# Patient Record
Sex: Female | Born: 1945
Health system: Southern US, Community
[De-identification: ages and names within clinical notes are randomized; demographics above are authoritative.]

## PROBLEM LIST (undated history)

## (undated) DIAGNOSIS — IMO0001 Reserved for inherently not codable concepts without codable children: Secondary | ICD-10-CM

## (undated) DIAGNOSIS — J449 Chronic obstructive pulmonary disease, unspecified: Secondary | ICD-10-CM

## (undated) DIAGNOSIS — G4733 Obstructive sleep apnea (adult) (pediatric): Secondary | ICD-10-CM

## (undated) DIAGNOSIS — F32A Depression, unspecified: Secondary | ICD-10-CM

## (undated) DIAGNOSIS — F1010 Alcohol abuse, uncomplicated: Secondary | ICD-10-CM

## (undated) DIAGNOSIS — K219 Gastro-esophageal reflux disease without esophagitis: Secondary | ICD-10-CM

## (undated) DIAGNOSIS — I10 Essential (primary) hypertension: Secondary | ICD-10-CM

## (undated) DIAGNOSIS — F329 Major depressive disorder, single episode, unspecified: Secondary | ICD-10-CM

## (undated) HISTORY — DX: Gastro-esophageal reflux disease without esophagitis: K21.9

## (undated) HISTORY — DX: Major depressive disorder, single episode, unspecified: F32.9

## (undated) HISTORY — DX: Alcohol abuse, uncomplicated: F10.10

## (undated) HISTORY — PX: ABDOMINAL HYSTERECTOMY: SHX81

## (undated) HISTORY — DX: Depression, unspecified: F32.A

## (undated) HISTORY — PX: CHOLECYSTECTOMY: SHX55

## (undated) HISTORY — DX: Chronic obstructive pulmonary disease, unspecified: J44.9

## (undated) HISTORY — PX: TONSILLECTOMY: SUR1361

## (undated) HISTORY — DX: Reserved for inherently not codable concepts without codable children: IMO0001

## (undated) HISTORY — DX: Obstructive sleep apnea (adult) (pediatric): G47.33

## (undated) HISTORY — PX: ROTATOR CUFF REPAIR: SHX139

## (undated) HISTORY — PX: REPLACEMENT TOTAL KNEE: SUR1224

## (undated) HISTORY — PX: APPENDECTOMY: SHX54

## (undated) HISTORY — DX: Essential (primary) hypertension: I10

---

## 2013-07-22 HISTORY — PX: BLADDER SURGERY: SHX569

## 2013-08-17 DIAGNOSIS — M5412 Radiculopathy, cervical region: Secondary | ICD-10-CM | POA: Diagnosis not present

## 2013-08-26 DIAGNOSIS — M5412 Radiculopathy, cervical region: Secondary | ICD-10-CM | POA: Diagnosis not present

## 2013-08-26 DIAGNOSIS — R9431 Abnormal electrocardiogram [ECG] [EKG]: Secondary | ICD-10-CM | POA: Diagnosis not present

## 2013-08-26 DIAGNOSIS — I10 Essential (primary) hypertension: Secondary | ICD-10-CM | POA: Diagnosis not present

## 2013-08-26 DIAGNOSIS — R1013 Epigastric pain: Secondary | ICD-10-CM | POA: Diagnosis not present

## 2013-08-26 DIAGNOSIS — R079 Chest pain, unspecified: Secondary | ICD-10-CM | POA: Diagnosis not present

## 2013-08-26 DIAGNOSIS — Z1239 Encounter for other screening for malignant neoplasm of breast: Secondary | ICD-10-CM | POA: Diagnosis not present

## 2013-08-26 DIAGNOSIS — E7800 Pure hypercholesterolemia, unspecified: Secondary | ICD-10-CM | POA: Diagnosis not present

## 2013-09-24 DIAGNOSIS — Z1331 Encounter for screening for depression: Secondary | ICD-10-CM | POA: Diagnosis not present

## 2013-09-24 DIAGNOSIS — Z139 Encounter for screening, unspecified: Secondary | ICD-10-CM | POA: Diagnosis not present

## 2013-09-24 DIAGNOSIS — Z9181 History of falling: Secondary | ICD-10-CM | POA: Diagnosis not present

## 2013-09-24 DIAGNOSIS — M2550 Pain in unspecified joint: Secondary | ICD-10-CM | POA: Diagnosis not present

## 2013-09-24 DIAGNOSIS — A048 Other specified bacterial intestinal infections: Secondary | ICD-10-CM | POA: Diagnosis not present

## 2013-09-24 DIAGNOSIS — I10 Essential (primary) hypertension: Secondary | ICD-10-CM | POA: Diagnosis not present

## 2013-10-04 DIAGNOSIS — M1710 Unilateral primary osteoarthritis, unspecified knee: Secondary | ICD-10-CM | POA: Diagnosis not present

## 2013-10-04 DIAGNOSIS — IMO0002 Reserved for concepts with insufficient information to code with codable children: Secondary | ICD-10-CM | POA: Diagnosis not present

## 2013-10-11 DIAGNOSIS — M5412 Radiculopathy, cervical region: Secondary | ICD-10-CM | POA: Diagnosis not present

## 2013-10-15 DIAGNOSIS — R1013 Epigastric pain: Secondary | ICD-10-CM | POA: Diagnosis not present

## 2013-10-18 DIAGNOSIS — N390 Urinary tract infection, site not specified: Secondary | ICD-10-CM | POA: Diagnosis not present

## 2013-10-18 DIAGNOSIS — I10 Essential (primary) hypertension: Secondary | ICD-10-CM | POA: Diagnosis not present

## 2013-10-26 DIAGNOSIS — N390 Urinary tract infection, site not specified: Secondary | ICD-10-CM | POA: Diagnosis not present

## 2013-11-01 DIAGNOSIS — M1710 Unilateral primary osteoarthritis, unspecified knee: Secondary | ICD-10-CM | POA: Diagnosis not present

## 2013-11-01 DIAGNOSIS — Z5181 Encounter for therapeutic drug level monitoring: Secondary | ICD-10-CM | POA: Diagnosis not present

## 2013-11-01 DIAGNOSIS — IMO0002 Reserved for concepts with insufficient information to code with codable children: Secondary | ICD-10-CM | POA: Diagnosis not present

## 2013-11-01 DIAGNOSIS — N390 Urinary tract infection, site not specified: Secondary | ICD-10-CM | POA: Diagnosis not present

## 2013-11-01 DIAGNOSIS — Z79899 Other long term (current) drug therapy: Secondary | ICD-10-CM | POA: Diagnosis not present

## 2013-11-01 DIAGNOSIS — N898 Other specified noninflammatory disorders of vagina: Secondary | ICD-10-CM | POA: Diagnosis not present

## 2013-11-01 DIAGNOSIS — R822 Biliuria: Secondary | ICD-10-CM | POA: Diagnosis not present

## 2013-11-12 DIAGNOSIS — M1710 Unilateral primary osteoarthritis, unspecified knee: Secondary | ICD-10-CM | POA: Diagnosis not present

## 2013-11-12 DIAGNOSIS — IMO0002 Reserved for concepts with insufficient information to code with codable children: Secondary | ICD-10-CM | POA: Diagnosis not present

## 2013-11-19 DIAGNOSIS — M1710 Unilateral primary osteoarthritis, unspecified knee: Secondary | ICD-10-CM | POA: Diagnosis not present

## 2013-11-19 DIAGNOSIS — IMO0002 Reserved for concepts with insufficient information to code with codable children: Secondary | ICD-10-CM | POA: Diagnosis not present

## 2013-11-26 DIAGNOSIS — M1710 Unilateral primary osteoarthritis, unspecified knee: Secondary | ICD-10-CM | POA: Diagnosis not present

## 2013-11-26 DIAGNOSIS — IMO0002 Reserved for concepts with insufficient information to code with codable children: Secondary | ICD-10-CM | POA: Diagnosis not present

## 2013-11-29 DIAGNOSIS — K219 Gastro-esophageal reflux disease without esophagitis: Secondary | ICD-10-CM | POA: Diagnosis not present

## 2013-11-29 DIAGNOSIS — R300 Dysuria: Secondary | ICD-10-CM | POA: Diagnosis not present

## 2013-11-29 DIAGNOSIS — J449 Chronic obstructive pulmonary disease, unspecified: Secondary | ICD-10-CM | POA: Diagnosis not present

## 2013-11-29 DIAGNOSIS — R000 Tachycardia, unspecified: Secondary | ICD-10-CM | POA: Diagnosis not present

## 2013-11-29 DIAGNOSIS — I10 Essential (primary) hypertension: Secondary | ICD-10-CM | POA: Diagnosis not present

## 2013-12-24 DIAGNOSIS — IMO0002 Reserved for concepts with insufficient information to code with codable children: Secondary | ICD-10-CM | POA: Diagnosis not present

## 2013-12-24 DIAGNOSIS — M1710 Unilateral primary osteoarthritis, unspecified knee: Secondary | ICD-10-CM | POA: Diagnosis not present

## 2014-01-15 DIAGNOSIS — J209 Acute bronchitis, unspecified: Secondary | ICD-10-CM | POA: Diagnosis not present

## 2014-01-15 DIAGNOSIS — R05 Cough: Secondary | ICD-10-CM | POA: Diagnosis not present

## 2014-01-15 DIAGNOSIS — R059 Cough, unspecified: Secondary | ICD-10-CM | POA: Diagnosis not present

## 2014-01-15 DIAGNOSIS — J449 Chronic obstructive pulmonary disease, unspecified: Secondary | ICD-10-CM | POA: Diagnosis not present

## 2014-01-17 DIAGNOSIS — J40 Bronchitis, not specified as acute or chronic: Secondary | ICD-10-CM | POA: Diagnosis not present

## 2014-01-28 DIAGNOSIS — J019 Acute sinusitis, unspecified: Secondary | ICD-10-CM | POA: Diagnosis not present

## 2014-01-28 DIAGNOSIS — I10 Essential (primary) hypertension: Secondary | ICD-10-CM | POA: Diagnosis not present

## 2014-01-28 DIAGNOSIS — Z1239 Encounter for other screening for malignant neoplasm of breast: Secondary | ICD-10-CM | POA: Diagnosis not present

## 2014-02-01 DIAGNOSIS — H2510 Age-related nuclear cataract, unspecified eye: Secondary | ICD-10-CM | POA: Diagnosis not present

## 2014-02-07 DIAGNOSIS — Z1231 Encounter for screening mammogram for malignant neoplasm of breast: Secondary | ICD-10-CM | POA: Diagnosis not present

## 2014-02-07 DIAGNOSIS — M5412 Radiculopathy, cervical region: Secondary | ICD-10-CM | POA: Diagnosis not present

## 2014-02-17 DIAGNOSIS — N390 Urinary tract infection, site not specified: Secondary | ICD-10-CM | POA: Diagnosis not present

## 2014-02-24 DIAGNOSIS — R21 Rash and other nonspecific skin eruption: Secondary | ICD-10-CM | POA: Diagnosis not present

## 2014-02-24 DIAGNOSIS — T148 Other injury of unspecified body region: Secondary | ICD-10-CM | POA: Diagnosis not present

## 2014-02-24 DIAGNOSIS — I10 Essential (primary) hypertension: Secondary | ICD-10-CM | POA: Diagnosis not present

## 2014-02-24 DIAGNOSIS — W57XXXA Bitten or stung by nonvenomous insect and other nonvenomous arthropods, initial encounter: Secondary | ICD-10-CM | POA: Diagnosis not present

## 2014-02-27 DIAGNOSIS — G4733 Obstructive sleep apnea (adult) (pediatric): Secondary | ICD-10-CM | POA: Diagnosis not present

## 2014-03-03 DIAGNOSIS — L2089 Other atopic dermatitis: Secondary | ICD-10-CM | POA: Diagnosis not present

## 2014-03-03 DIAGNOSIS — IMO0001 Reserved for inherently not codable concepts without codable children: Secondary | ICD-10-CM | POA: Diagnosis not present

## 2014-03-03 DIAGNOSIS — T148 Other injury of unspecified body region: Secondary | ICD-10-CM | POA: Diagnosis not present

## 2014-03-03 DIAGNOSIS — R21 Rash and other nonspecific skin eruption: Secondary | ICD-10-CM | POA: Diagnosis not present

## 2014-03-11 DIAGNOSIS — N393 Stress incontinence (female) (male): Secondary | ICD-10-CM | POA: Diagnosis not present

## 2014-04-05 DIAGNOSIS — M5412 Radiculopathy, cervical region: Secondary | ICD-10-CM | POA: Diagnosis not present

## 2014-04-28 DIAGNOSIS — Z01812 Encounter for preprocedural laboratory examination: Secondary | ICD-10-CM | POA: Diagnosis not present

## 2014-04-28 DIAGNOSIS — N393 Stress incontinence (female) (male): Secondary | ICD-10-CM | POA: Diagnosis not present

## 2014-05-02 DIAGNOSIS — L03116 Cellulitis of left lower limb: Secondary | ICD-10-CM | POA: Diagnosis not present

## 2014-05-06 DIAGNOSIS — J449 Chronic obstructive pulmonary disease, unspecified: Secondary | ICD-10-CM | POA: Diagnosis not present

## 2014-05-06 DIAGNOSIS — I493 Ventricular premature depolarization: Secondary | ICD-10-CM | POA: Diagnosis not present

## 2014-05-06 DIAGNOSIS — I471 Supraventricular tachycardia: Secondary | ICD-10-CM | POA: Diagnosis not present

## 2014-05-06 DIAGNOSIS — I10 Essential (primary) hypertension: Secondary | ICD-10-CM | POA: Diagnosis not present

## 2014-05-06 DIAGNOSIS — I491 Atrial premature depolarization: Secondary | ICD-10-CM | POA: Diagnosis not present

## 2014-05-06 DIAGNOSIS — N5203 Combined arterial insufficiency and corporo-venous occlusive erectile dysfunction: Secondary | ICD-10-CM | POA: Diagnosis not present

## 2014-05-06 DIAGNOSIS — R072 Precordial pain: Secondary | ICD-10-CM | POA: Diagnosis not present

## 2014-05-09 DIAGNOSIS — L03116 Cellulitis of left lower limb: Secondary | ICD-10-CM | POA: Diagnosis not present

## 2014-05-09 DIAGNOSIS — I10 Essential (primary) hypertension: Secondary | ICD-10-CM | POA: Diagnosis not present

## 2014-05-09 DIAGNOSIS — M7989 Other specified soft tissue disorders: Secondary | ICD-10-CM | POA: Diagnosis not present

## 2014-05-16 DIAGNOSIS — R252 Cramp and spasm: Secondary | ICD-10-CM | POA: Diagnosis not present

## 2014-05-16 DIAGNOSIS — B351 Tinea unguium: Secondary | ICD-10-CM | POA: Diagnosis not present

## 2014-05-16 DIAGNOSIS — L03116 Cellulitis of left lower limb: Secondary | ICD-10-CM | POA: Diagnosis not present

## 2014-05-16 DIAGNOSIS — I10 Essential (primary) hypertension: Secondary | ICD-10-CM | POA: Diagnosis not present

## 2014-05-23 DIAGNOSIS — N390 Urinary tract infection, site not specified: Secondary | ICD-10-CM | POA: Diagnosis not present

## 2014-05-25 DIAGNOSIS — Z01812 Encounter for preprocedural laboratory examination: Secondary | ICD-10-CM | POA: Diagnosis not present

## 2014-05-25 DIAGNOSIS — N393 Stress incontinence (female) (male): Secondary | ICD-10-CM | POA: Diagnosis not present

## 2014-06-03 DIAGNOSIS — I10 Essential (primary) hypertension: Secondary | ICD-10-CM | POA: Diagnosis not present

## 2014-06-03 DIAGNOSIS — Z5181 Encounter for therapeutic drug level monitoring: Secondary | ICD-10-CM | POA: Diagnosis not present

## 2014-06-03 DIAGNOSIS — Z23 Encounter for immunization: Secondary | ICD-10-CM | POA: Diagnosis not present

## 2014-06-03 DIAGNOSIS — N390 Urinary tract infection, site not specified: Secondary | ICD-10-CM | POA: Diagnosis not present

## 2014-06-03 DIAGNOSIS — J449 Chronic obstructive pulmonary disease, unspecified: Secondary | ICD-10-CM | POA: Diagnosis not present

## 2014-06-23 DIAGNOSIS — N393 Stress incontinence (female) (male): Secondary | ICD-10-CM | POA: Diagnosis not present

## 2014-06-23 DIAGNOSIS — R312 Other microscopic hematuria: Secondary | ICD-10-CM | POA: Diagnosis not present

## 2014-06-27 DIAGNOSIS — K219 Gastro-esophageal reflux disease without esophagitis: Secondary | ICD-10-CM | POA: Diagnosis not present

## 2014-06-27 DIAGNOSIS — I10 Essential (primary) hypertension: Secondary | ICD-10-CM | POA: Diagnosis not present

## 2014-06-27 DIAGNOSIS — N393 Stress incontinence (female) (male): Secondary | ICD-10-CM | POA: Diagnosis not present

## 2014-06-27 DIAGNOSIS — J449 Chronic obstructive pulmonary disease, unspecified: Secondary | ICD-10-CM | POA: Diagnosis not present

## 2014-06-28 DIAGNOSIS — J449 Chronic obstructive pulmonary disease, unspecified: Secondary | ICD-10-CM | POA: Diagnosis not present

## 2014-06-28 DIAGNOSIS — N393 Stress incontinence (female) (male): Secondary | ICD-10-CM | POA: Diagnosis not present

## 2014-06-28 DIAGNOSIS — I10 Essential (primary) hypertension: Secondary | ICD-10-CM | POA: Diagnosis not present

## 2014-06-28 DIAGNOSIS — K219 Gastro-esophageal reflux disease without esophagitis: Secondary | ICD-10-CM | POA: Diagnosis not present

## 2014-07-07 DIAGNOSIS — N390 Urinary tract infection, site not specified: Secondary | ICD-10-CM | POA: Diagnosis not present

## 2014-07-07 DIAGNOSIS — N393 Stress incontinence (female) (male): Secondary | ICD-10-CM | POA: Diagnosis not present

## 2014-07-07 DIAGNOSIS — R319 Hematuria, unspecified: Secondary | ICD-10-CM | POA: Diagnosis not present

## 2014-07-08 DIAGNOSIS — Z79899 Other long term (current) drug therapy: Secondary | ICD-10-CM | POA: Diagnosis not present

## 2014-07-08 DIAGNOSIS — Z5181 Encounter for therapeutic drug level monitoring: Secondary | ICD-10-CM | POA: Diagnosis not present

## 2014-07-08 DIAGNOSIS — I10 Essential (primary) hypertension: Secondary | ICD-10-CM | POA: Diagnosis not present

## 2014-07-08 DIAGNOSIS — J449 Chronic obstructive pulmonary disease, unspecified: Secondary | ICD-10-CM | POA: Diagnosis not present

## 2014-07-20 DIAGNOSIS — I10 Essential (primary) hypertension: Secondary | ICD-10-CM | POA: Diagnosis not present

## 2014-07-20 DIAGNOSIS — N393 Stress incontinence (female) (male): Secondary | ICD-10-CM | POA: Diagnosis not present

## 2014-07-20 DIAGNOSIS — R32 Unspecified urinary incontinence: Secondary | ICD-10-CM | POA: Diagnosis not present

## 2014-07-20 DIAGNOSIS — K219 Gastro-esophageal reflux disease without esophagitis: Secondary | ICD-10-CM | POA: Diagnosis not present

## 2014-07-20 DIAGNOSIS — J449 Chronic obstructive pulmonary disease, unspecified: Secondary | ICD-10-CM | POA: Diagnosis not present

## 2014-07-21 DIAGNOSIS — K219 Gastro-esophageal reflux disease without esophagitis: Secondary | ICD-10-CM | POA: Diagnosis not present

## 2014-07-21 DIAGNOSIS — J449 Chronic obstructive pulmonary disease, unspecified: Secondary | ICD-10-CM | POA: Diagnosis not present

## 2014-07-21 DIAGNOSIS — I10 Essential (primary) hypertension: Secondary | ICD-10-CM | POA: Diagnosis not present

## 2014-07-21 DIAGNOSIS — N393 Stress incontinence (female) (male): Secondary | ICD-10-CM | POA: Diagnosis not present

## 2014-07-28 DIAGNOSIS — M5412 Radiculopathy, cervical region: Secondary | ICD-10-CM | POA: Diagnosis not present

## 2014-08-02 DIAGNOSIS — G459 Transient cerebral ischemic attack, unspecified: Secondary | ICD-10-CM | POA: Diagnosis not present

## 2014-08-04 DIAGNOSIS — N393 Stress incontinence (female) (male): Secondary | ICD-10-CM | POA: Diagnosis not present

## 2014-08-10 DIAGNOSIS — Z5181 Encounter for therapeutic drug level monitoring: Secondary | ICD-10-CM | POA: Diagnosis not present

## 2014-08-10 DIAGNOSIS — J449 Chronic obstructive pulmonary disease, unspecified: Secondary | ICD-10-CM | POA: Diagnosis not present

## 2014-08-10 DIAGNOSIS — H9202 Otalgia, left ear: Secondary | ICD-10-CM | POA: Diagnosis not present

## 2014-08-10 DIAGNOSIS — I10 Essential (primary) hypertension: Secondary | ICD-10-CM | POA: Diagnosis not present

## 2014-08-10 DIAGNOSIS — H6122 Impacted cerumen, left ear: Secondary | ICD-10-CM | POA: Diagnosis not present

## 2014-08-19 DIAGNOSIS — N3946 Mixed incontinence: Secondary | ICD-10-CM | POA: Diagnosis not present

## 2014-09-29 DIAGNOSIS — N3946 Mixed incontinence: Secondary | ICD-10-CM | POA: Diagnosis not present

## 2014-09-29 DIAGNOSIS — R312 Other microscopic hematuria: Secondary | ICD-10-CM | POA: Diagnosis not present

## 2014-10-03 DIAGNOSIS — M1712 Unilateral primary osteoarthritis, left knee: Secondary | ICD-10-CM | POA: Diagnosis not present

## 2014-10-03 DIAGNOSIS — M25562 Pain in left knee: Secondary | ICD-10-CM | POA: Diagnosis not present

## 2014-10-07 DIAGNOSIS — Z5181 Encounter for therapeutic drug level monitoring: Secondary | ICD-10-CM | POA: Diagnosis not present

## 2014-10-07 DIAGNOSIS — H9193 Unspecified hearing loss, bilateral: Secondary | ICD-10-CM | POA: Diagnosis not present

## 2014-10-07 DIAGNOSIS — R5383 Other fatigue: Secondary | ICD-10-CM | POA: Diagnosis not present

## 2014-10-07 DIAGNOSIS — J449 Chronic obstructive pulmonary disease, unspecified: Secondary | ICD-10-CM | POA: Diagnosis not present

## 2014-10-07 DIAGNOSIS — I10 Essential (primary) hypertension: Secondary | ICD-10-CM | POA: Diagnosis not present

## 2014-10-21 DIAGNOSIS — R1904 Left lower quadrant abdominal swelling, mass and lump: Secondary | ICD-10-CM | POA: Diagnosis not present

## 2014-10-21 DIAGNOSIS — I10 Essential (primary) hypertension: Secondary | ICD-10-CM | POA: Diagnosis not present

## 2014-10-21 DIAGNOSIS — J449 Chronic obstructive pulmonary disease, unspecified: Secondary | ICD-10-CM | POA: Diagnosis not present

## 2014-10-21 DIAGNOSIS — Z6834 Body mass index (BMI) 34.0-34.9, adult: Secondary | ICD-10-CM | POA: Diagnosis not present

## 2014-10-26 DIAGNOSIS — R1904 Left lower quadrant abdominal swelling, mass and lump: Secondary | ICD-10-CM | POA: Diagnosis not present

## 2014-11-10 DIAGNOSIS — R1904 Left lower quadrant abdominal swelling, mass and lump: Secondary | ICD-10-CM | POA: Diagnosis not present

## 2014-11-10 DIAGNOSIS — N907 Vulvar cyst: Secondary | ICD-10-CM | POA: Diagnosis not present

## 2014-11-17 DIAGNOSIS — J310 Chronic rhinitis: Secondary | ICD-10-CM | POA: Diagnosis not present

## 2014-11-17 DIAGNOSIS — L258 Unspecified contact dermatitis due to other agents: Secondary | ICD-10-CM | POA: Diagnosis not present

## 2014-11-23 DIAGNOSIS — K439 Ventral hernia without obstruction or gangrene: Secondary | ICD-10-CM | POA: Diagnosis not present

## 2014-11-23 DIAGNOSIS — K5792 Diverticulitis of intestine, part unspecified, without perforation or abscess without bleeding: Secondary | ICD-10-CM | POA: Diagnosis not present

## 2014-11-23 DIAGNOSIS — R1904 Left lower quadrant abdominal swelling, mass and lump: Secondary | ICD-10-CM | POA: Diagnosis not present

## 2014-11-23 DIAGNOSIS — R1032 Left lower quadrant pain: Secondary | ICD-10-CM | POA: Diagnosis not present

## 2014-11-28 DIAGNOSIS — M545 Low back pain: Secondary | ICD-10-CM | POA: Diagnosis not present

## 2014-11-28 DIAGNOSIS — R609 Edema, unspecified: Secondary | ICD-10-CM | POA: Diagnosis not present

## 2014-11-28 DIAGNOSIS — I10 Essential (primary) hypertension: Secondary | ICD-10-CM | POA: Diagnosis not present

## 2014-12-08 DIAGNOSIS — R21 Rash and other nonspecific skin eruption: Secondary | ICD-10-CM | POA: Diagnosis not present

## 2014-12-08 DIAGNOSIS — J310 Chronic rhinitis: Secondary | ICD-10-CM | POA: Diagnosis not present

## 2014-12-08 DIAGNOSIS — L258 Unspecified contact dermatitis due to other agents: Secondary | ICD-10-CM | POA: Diagnosis not present

## 2014-12-09 DIAGNOSIS — M25562 Pain in left knee: Secondary | ICD-10-CM | POA: Diagnosis not present

## 2014-12-09 DIAGNOSIS — M1712 Unilateral primary osteoarthritis, left knee: Secondary | ICD-10-CM | POA: Diagnosis not present

## 2014-12-13 DIAGNOSIS — R1310 Dysphagia, unspecified: Secondary | ICD-10-CM | POA: Diagnosis not present

## 2014-12-13 DIAGNOSIS — Z01818 Encounter for other preprocedural examination: Secondary | ICD-10-CM | POA: Diagnosis not present

## 2014-12-13 DIAGNOSIS — J449 Chronic obstructive pulmonary disease, unspecified: Secondary | ICD-10-CM | POA: Diagnosis not present

## 2014-12-13 DIAGNOSIS — I10 Essential (primary) hypertension: Secondary | ICD-10-CM | POA: Diagnosis not present

## 2014-12-13 DIAGNOSIS — M1712 Unilateral primary osteoarthritis, left knee: Secondary | ICD-10-CM | POA: Diagnosis not present

## 2014-12-13 DIAGNOSIS — I499 Cardiac arrhythmia, unspecified: Secondary | ICD-10-CM | POA: Diagnosis not present

## 2014-12-22 DIAGNOSIS — M5412 Radiculopathy, cervical region: Secondary | ICD-10-CM | POA: Diagnosis not present

## 2014-12-29 DIAGNOSIS — N393 Stress incontinence (female) (male): Secondary | ICD-10-CM | POA: Diagnosis not present

## 2015-01-03 DIAGNOSIS — K219 Gastro-esophageal reflux disease without esophagitis: Secondary | ICD-10-CM | POA: Diagnosis present

## 2015-01-03 DIAGNOSIS — M1712 Unilateral primary osteoarthritis, left knee: Secondary | ICD-10-CM | POA: Diagnosis not present

## 2015-01-03 DIAGNOSIS — Z471 Aftercare following joint replacement surgery: Secondary | ICD-10-CM | POA: Diagnosis not present

## 2015-01-03 DIAGNOSIS — J449 Chronic obstructive pulmonary disease, unspecified: Secondary | ICD-10-CM | POA: Diagnosis not present

## 2015-01-03 DIAGNOSIS — G4733 Obstructive sleep apnea (adult) (pediatric): Secondary | ICD-10-CM | POA: Diagnosis present

## 2015-01-03 DIAGNOSIS — R1310 Dysphagia, unspecified: Secondary | ICD-10-CM | POA: Diagnosis not present

## 2015-01-03 DIAGNOSIS — R432 Parageusia: Secondary | ICD-10-CM | POA: Diagnosis present

## 2015-01-03 DIAGNOSIS — I10 Essential (primary) hypertension: Secondary | ICD-10-CM | POA: Diagnosis present

## 2015-01-03 DIAGNOSIS — Z96652 Presence of left artificial knee joint: Secondary | ICD-10-CM | POA: Diagnosis not present

## 2015-01-03 DIAGNOSIS — M25562 Pain in left knee: Secondary | ICD-10-CM | POA: Diagnosis not present

## 2015-01-03 DIAGNOSIS — G8918 Other acute postprocedural pain: Secondary | ICD-10-CM | POA: Diagnosis not present

## 2015-01-03 DIAGNOSIS — I499 Cardiac arrhythmia, unspecified: Secondary | ICD-10-CM | POA: Diagnosis not present

## 2015-01-07 DIAGNOSIS — R1310 Dysphagia, unspecified: Secondary | ICD-10-CM | POA: Diagnosis not present

## 2015-01-07 DIAGNOSIS — G4733 Obstructive sleep apnea (adult) (pediatric): Secondary | ICD-10-CM | POA: Diagnosis not present

## 2015-01-07 DIAGNOSIS — M1990 Unspecified osteoarthritis, unspecified site: Secondary | ICD-10-CM | POA: Diagnosis not present

## 2015-01-07 DIAGNOSIS — K219 Gastro-esophageal reflux disease without esophagitis: Secondary | ICD-10-CM | POA: Diagnosis not present

## 2015-01-07 DIAGNOSIS — I10 Essential (primary) hypertension: Secondary | ICD-10-CM | POA: Diagnosis not present

## 2015-01-07 DIAGNOSIS — I4891 Unspecified atrial fibrillation: Secondary | ICD-10-CM | POA: Diagnosis not present

## 2015-01-07 DIAGNOSIS — Z96652 Presence of left artificial knee joint: Secondary | ICD-10-CM | POA: Diagnosis not present

## 2015-01-07 DIAGNOSIS — J449 Chronic obstructive pulmonary disease, unspecified: Secondary | ICD-10-CM | POA: Diagnosis not present

## 2015-01-07 DIAGNOSIS — Z471 Aftercare following joint replacement surgery: Secondary | ICD-10-CM | POA: Diagnosis not present

## 2015-01-07 DIAGNOSIS — Z7982 Long term (current) use of aspirin: Secondary | ICD-10-CM | POA: Diagnosis not present

## 2015-01-09 DIAGNOSIS — M1990 Unspecified osteoarthritis, unspecified site: Secondary | ICD-10-CM | POA: Diagnosis not present

## 2015-01-09 DIAGNOSIS — Z471 Aftercare following joint replacement surgery: Secondary | ICD-10-CM | POA: Diagnosis not present

## 2015-01-09 DIAGNOSIS — I4891 Unspecified atrial fibrillation: Secondary | ICD-10-CM | POA: Diagnosis not present

## 2015-01-09 DIAGNOSIS — I10 Essential (primary) hypertension: Secondary | ICD-10-CM | POA: Diagnosis not present

## 2015-01-09 DIAGNOSIS — J449 Chronic obstructive pulmonary disease, unspecified: Secondary | ICD-10-CM | POA: Diagnosis not present

## 2015-01-09 DIAGNOSIS — R1310 Dysphagia, unspecified: Secondary | ICD-10-CM | POA: Diagnosis not present

## 2015-01-10 DIAGNOSIS — M1990 Unspecified osteoarthritis, unspecified site: Secondary | ICD-10-CM | POA: Diagnosis not present

## 2015-01-10 DIAGNOSIS — J449 Chronic obstructive pulmonary disease, unspecified: Secondary | ICD-10-CM | POA: Diagnosis not present

## 2015-01-10 DIAGNOSIS — I10 Essential (primary) hypertension: Secondary | ICD-10-CM | POA: Diagnosis not present

## 2015-01-10 DIAGNOSIS — Z471 Aftercare following joint replacement surgery: Secondary | ICD-10-CM | POA: Diagnosis not present

## 2015-01-10 DIAGNOSIS — I4891 Unspecified atrial fibrillation: Secondary | ICD-10-CM | POA: Diagnosis not present

## 2015-01-10 DIAGNOSIS — R1310 Dysphagia, unspecified: Secondary | ICD-10-CM | POA: Diagnosis not present

## 2015-01-12 DIAGNOSIS — J449 Chronic obstructive pulmonary disease, unspecified: Secondary | ICD-10-CM | POA: Diagnosis not present

## 2015-01-12 DIAGNOSIS — M1990 Unspecified osteoarthritis, unspecified site: Secondary | ICD-10-CM | POA: Diagnosis not present

## 2015-01-12 DIAGNOSIS — Z471 Aftercare following joint replacement surgery: Secondary | ICD-10-CM | POA: Diagnosis not present

## 2015-01-12 DIAGNOSIS — R1310 Dysphagia, unspecified: Secondary | ICD-10-CM | POA: Diagnosis not present

## 2015-01-12 DIAGNOSIS — I10 Essential (primary) hypertension: Secondary | ICD-10-CM | POA: Diagnosis not present

## 2015-01-12 DIAGNOSIS — I4891 Unspecified atrial fibrillation: Secondary | ICD-10-CM | POA: Diagnosis not present

## 2015-01-17 DIAGNOSIS — I10 Essential (primary) hypertension: Secondary | ICD-10-CM | POA: Diagnosis not present

## 2015-01-17 DIAGNOSIS — J449 Chronic obstructive pulmonary disease, unspecified: Secondary | ICD-10-CM | POA: Diagnosis not present

## 2015-01-17 DIAGNOSIS — I4891 Unspecified atrial fibrillation: Secondary | ICD-10-CM | POA: Diagnosis not present

## 2015-01-17 DIAGNOSIS — M1990 Unspecified osteoarthritis, unspecified site: Secondary | ICD-10-CM | POA: Diagnosis not present

## 2015-01-17 DIAGNOSIS — Z471 Aftercare following joint replacement surgery: Secondary | ICD-10-CM | POA: Diagnosis not present

## 2015-01-17 DIAGNOSIS — R1310 Dysphagia, unspecified: Secondary | ICD-10-CM | POA: Diagnosis not present

## 2015-01-18 DIAGNOSIS — I10 Essential (primary) hypertension: Secondary | ICD-10-CM | POA: Diagnosis not present

## 2015-01-18 DIAGNOSIS — I4891 Unspecified atrial fibrillation: Secondary | ICD-10-CM | POA: Diagnosis not present

## 2015-01-18 DIAGNOSIS — J449 Chronic obstructive pulmonary disease, unspecified: Secondary | ICD-10-CM | POA: Diagnosis not present

## 2015-01-18 DIAGNOSIS — R1310 Dysphagia, unspecified: Secondary | ICD-10-CM | POA: Diagnosis not present

## 2015-01-18 DIAGNOSIS — M1990 Unspecified osteoarthritis, unspecified site: Secondary | ICD-10-CM | POA: Diagnosis not present

## 2015-01-18 DIAGNOSIS — Z471 Aftercare following joint replacement surgery: Secondary | ICD-10-CM | POA: Diagnosis not present

## 2015-01-19 DIAGNOSIS — Z471 Aftercare following joint replacement surgery: Secondary | ICD-10-CM | POA: Diagnosis not present

## 2015-01-19 DIAGNOSIS — J449 Chronic obstructive pulmonary disease, unspecified: Secondary | ICD-10-CM | POA: Diagnosis not present

## 2015-01-19 DIAGNOSIS — I4891 Unspecified atrial fibrillation: Secondary | ICD-10-CM | POA: Diagnosis not present

## 2015-01-19 DIAGNOSIS — I10 Essential (primary) hypertension: Secondary | ICD-10-CM | POA: Diagnosis not present

## 2015-01-19 DIAGNOSIS — M1990 Unspecified osteoarthritis, unspecified site: Secondary | ICD-10-CM | POA: Diagnosis not present

## 2015-01-19 DIAGNOSIS — R1310 Dysphagia, unspecified: Secondary | ICD-10-CM | POA: Diagnosis not present

## 2015-01-20 DIAGNOSIS — M1712 Unilateral primary osteoarthritis, left knee: Secondary | ICD-10-CM | POA: Diagnosis not present

## 2015-01-27 DIAGNOSIS — M25562 Pain in left knee: Secondary | ICD-10-CM | POA: Diagnosis not present

## 2015-01-27 DIAGNOSIS — M1712 Unilateral primary osteoarthritis, left knee: Secondary | ICD-10-CM | POA: Diagnosis not present

## 2015-01-27 DIAGNOSIS — I10 Essential (primary) hypertension: Secondary | ICD-10-CM | POA: Diagnosis not present

## 2015-01-27 DIAGNOSIS — Z96652 Presence of left artificial knee joint: Secondary | ICD-10-CM | POA: Diagnosis not present

## 2015-01-27 DIAGNOSIS — R269 Unspecified abnormalities of gait and mobility: Secondary | ICD-10-CM | POA: Diagnosis not present

## 2015-01-27 DIAGNOSIS — I499 Cardiac arrhythmia, unspecified: Secondary | ICD-10-CM | POA: Diagnosis not present

## 2015-01-27 DIAGNOSIS — M1990 Unspecified osteoarthritis, unspecified site: Secondary | ICD-10-CM | POA: Diagnosis not present

## 2015-01-30 DIAGNOSIS — R269 Unspecified abnormalities of gait and mobility: Secondary | ICD-10-CM | POA: Diagnosis not present

## 2015-01-30 DIAGNOSIS — M25562 Pain in left knee: Secondary | ICD-10-CM | POA: Diagnosis not present

## 2015-01-30 DIAGNOSIS — I10 Essential (primary) hypertension: Secondary | ICD-10-CM | POA: Diagnosis not present

## 2015-01-30 DIAGNOSIS — M1990 Unspecified osteoarthritis, unspecified site: Secondary | ICD-10-CM | POA: Diagnosis not present

## 2015-01-30 DIAGNOSIS — M1712 Unilateral primary osteoarthritis, left knee: Secondary | ICD-10-CM | POA: Diagnosis not present

## 2015-01-30 DIAGNOSIS — I499 Cardiac arrhythmia, unspecified: Secondary | ICD-10-CM | POA: Diagnosis not present

## 2015-02-02 DIAGNOSIS — I499 Cardiac arrhythmia, unspecified: Secondary | ICD-10-CM | POA: Diagnosis not present

## 2015-02-02 DIAGNOSIS — M1990 Unspecified osteoarthritis, unspecified site: Secondary | ICD-10-CM | POA: Diagnosis not present

## 2015-02-02 DIAGNOSIS — I10 Essential (primary) hypertension: Secondary | ICD-10-CM | POA: Diagnosis not present

## 2015-02-02 DIAGNOSIS — M1712 Unilateral primary osteoarthritis, left knee: Secondary | ICD-10-CM | POA: Diagnosis not present

## 2015-02-02 DIAGNOSIS — M25562 Pain in left knee: Secondary | ICD-10-CM | POA: Diagnosis not present

## 2015-02-02 DIAGNOSIS — R269 Unspecified abnormalities of gait and mobility: Secondary | ICD-10-CM | POA: Diagnosis not present

## 2015-02-06 DIAGNOSIS — M1712 Unilateral primary osteoarthritis, left knee: Secondary | ICD-10-CM | POA: Diagnosis not present

## 2015-02-06 DIAGNOSIS — I499 Cardiac arrhythmia, unspecified: Secondary | ICD-10-CM | POA: Diagnosis not present

## 2015-02-06 DIAGNOSIS — M25562 Pain in left knee: Secondary | ICD-10-CM | POA: Diagnosis not present

## 2015-02-06 DIAGNOSIS — M1990 Unspecified osteoarthritis, unspecified site: Secondary | ICD-10-CM | POA: Diagnosis not present

## 2015-02-06 DIAGNOSIS — I10 Essential (primary) hypertension: Secondary | ICD-10-CM | POA: Diagnosis not present

## 2015-02-06 DIAGNOSIS — R269 Unspecified abnormalities of gait and mobility: Secondary | ICD-10-CM | POA: Diagnosis not present

## 2015-02-08 DIAGNOSIS — R269 Unspecified abnormalities of gait and mobility: Secondary | ICD-10-CM | POA: Diagnosis not present

## 2015-02-08 DIAGNOSIS — I10 Essential (primary) hypertension: Secondary | ICD-10-CM | POA: Diagnosis not present

## 2015-02-08 DIAGNOSIS — M25562 Pain in left knee: Secondary | ICD-10-CM | POA: Diagnosis not present

## 2015-02-08 DIAGNOSIS — I499 Cardiac arrhythmia, unspecified: Secondary | ICD-10-CM | POA: Diagnosis not present

## 2015-02-08 DIAGNOSIS — M1990 Unspecified osteoarthritis, unspecified site: Secondary | ICD-10-CM | POA: Diagnosis not present

## 2015-02-08 DIAGNOSIS — M1712 Unilateral primary osteoarthritis, left knee: Secondary | ICD-10-CM | POA: Diagnosis not present

## 2015-02-14 DIAGNOSIS — M1712 Unilateral primary osteoarthritis, left knee: Secondary | ICD-10-CM | POA: Diagnosis not present

## 2015-02-14 DIAGNOSIS — I10 Essential (primary) hypertension: Secondary | ICD-10-CM | POA: Diagnosis not present

## 2015-02-14 DIAGNOSIS — M25562 Pain in left knee: Secondary | ICD-10-CM | POA: Diagnosis not present

## 2015-02-14 DIAGNOSIS — R269 Unspecified abnormalities of gait and mobility: Secondary | ICD-10-CM | POA: Diagnosis not present

## 2015-02-14 DIAGNOSIS — I499 Cardiac arrhythmia, unspecified: Secondary | ICD-10-CM | POA: Diagnosis not present

## 2015-02-14 DIAGNOSIS — M1990 Unspecified osteoarthritis, unspecified site: Secondary | ICD-10-CM | POA: Diagnosis not present

## 2015-02-16 DIAGNOSIS — I499 Cardiac arrhythmia, unspecified: Secondary | ICD-10-CM | POA: Diagnosis not present

## 2015-02-16 DIAGNOSIS — I10 Essential (primary) hypertension: Secondary | ICD-10-CM | POA: Diagnosis not present

## 2015-02-16 DIAGNOSIS — M1712 Unilateral primary osteoarthritis, left knee: Secondary | ICD-10-CM | POA: Diagnosis not present

## 2015-02-16 DIAGNOSIS — M1990 Unspecified osteoarthritis, unspecified site: Secondary | ICD-10-CM | POA: Diagnosis not present

## 2015-02-16 DIAGNOSIS — R922 Inconclusive mammogram: Secondary | ICD-10-CM | POA: Diagnosis not present

## 2015-02-16 DIAGNOSIS — Z1231 Encounter for screening mammogram for malignant neoplasm of breast: Secondary | ICD-10-CM | POA: Diagnosis not present

## 2015-02-16 DIAGNOSIS — M25562 Pain in left knee: Secondary | ICD-10-CM | POA: Diagnosis not present

## 2015-02-16 DIAGNOSIS — R269 Unspecified abnormalities of gait and mobility: Secondary | ICD-10-CM | POA: Diagnosis not present

## 2015-02-20 DIAGNOSIS — M25462 Effusion, left knee: Secondary | ICD-10-CM | POA: Diagnosis not present

## 2015-02-20 DIAGNOSIS — R269 Unspecified abnormalities of gait and mobility: Secondary | ICD-10-CM | POA: Diagnosis not present

## 2015-02-20 DIAGNOSIS — M25562 Pain in left knee: Secondary | ICD-10-CM | POA: Diagnosis not present

## 2015-02-20 DIAGNOSIS — I10 Essential (primary) hypertension: Secondary | ICD-10-CM | POA: Diagnosis not present

## 2015-02-20 DIAGNOSIS — I499 Cardiac arrhythmia, unspecified: Secondary | ICD-10-CM | POA: Diagnosis not present

## 2015-02-20 DIAGNOSIS — M1990 Unspecified osteoarthritis, unspecified site: Secondary | ICD-10-CM | POA: Diagnosis not present

## 2015-02-20 DIAGNOSIS — Z96652 Presence of left artificial knee joint: Secondary | ICD-10-CM | POA: Diagnosis not present

## 2015-02-23 DIAGNOSIS — M1990 Unspecified osteoarthritis, unspecified site: Secondary | ICD-10-CM | POA: Diagnosis not present

## 2015-02-23 DIAGNOSIS — I499 Cardiac arrhythmia, unspecified: Secondary | ICD-10-CM | POA: Diagnosis not present

## 2015-02-23 DIAGNOSIS — M25562 Pain in left knee: Secondary | ICD-10-CM | POA: Diagnosis not present

## 2015-02-23 DIAGNOSIS — R269 Unspecified abnormalities of gait and mobility: Secondary | ICD-10-CM | POA: Diagnosis not present

## 2015-02-23 DIAGNOSIS — Z96652 Presence of left artificial knee joint: Secondary | ICD-10-CM | POA: Diagnosis not present

## 2015-02-23 DIAGNOSIS — I10 Essential (primary) hypertension: Secondary | ICD-10-CM | POA: Diagnosis not present

## 2015-02-27 DIAGNOSIS — R269 Unspecified abnormalities of gait and mobility: Secondary | ICD-10-CM | POA: Diagnosis not present

## 2015-02-27 DIAGNOSIS — M1990 Unspecified osteoarthritis, unspecified site: Secondary | ICD-10-CM | POA: Diagnosis not present

## 2015-02-27 DIAGNOSIS — I499 Cardiac arrhythmia, unspecified: Secondary | ICD-10-CM | POA: Diagnosis not present

## 2015-02-27 DIAGNOSIS — M25562 Pain in left knee: Secondary | ICD-10-CM | POA: Diagnosis not present

## 2015-02-27 DIAGNOSIS — Z96652 Presence of left artificial knee joint: Secondary | ICD-10-CM | POA: Diagnosis not present

## 2015-02-27 DIAGNOSIS — E780 Pure hypercholesterolemia: Secondary | ICD-10-CM | POA: Diagnosis not present

## 2015-02-27 DIAGNOSIS — I10 Essential (primary) hypertension: Secondary | ICD-10-CM | POA: Diagnosis not present

## 2015-02-27 DIAGNOSIS — M545 Low back pain: Secondary | ICD-10-CM | POA: Diagnosis not present

## 2015-02-27 DIAGNOSIS — R609 Edema, unspecified: Secondary | ICD-10-CM | POA: Diagnosis not present

## 2015-03-02 DIAGNOSIS — M25562 Pain in left knee: Secondary | ICD-10-CM | POA: Diagnosis not present

## 2015-03-02 DIAGNOSIS — R269 Unspecified abnormalities of gait and mobility: Secondary | ICD-10-CM | POA: Diagnosis not present

## 2015-03-02 DIAGNOSIS — Z96652 Presence of left artificial knee joint: Secondary | ICD-10-CM | POA: Diagnosis not present

## 2015-03-02 DIAGNOSIS — M1990 Unspecified osteoarthritis, unspecified site: Secondary | ICD-10-CM | POA: Diagnosis not present

## 2015-03-02 DIAGNOSIS — Z79899 Other long term (current) drug therapy: Secondary | ICD-10-CM | POA: Diagnosis not present

## 2015-03-02 DIAGNOSIS — I10 Essential (primary) hypertension: Secondary | ICD-10-CM | POA: Diagnosis not present

## 2015-03-02 DIAGNOSIS — E780 Pure hypercholesterolemia: Secondary | ICD-10-CM | POA: Diagnosis not present

## 2015-03-02 DIAGNOSIS — I499 Cardiac arrhythmia, unspecified: Secondary | ICD-10-CM | POA: Diagnosis not present

## 2015-03-06 DIAGNOSIS — M25562 Pain in left knee: Secondary | ICD-10-CM | POA: Diagnosis not present

## 2015-03-06 DIAGNOSIS — M1990 Unspecified osteoarthritis, unspecified site: Secondary | ICD-10-CM | POA: Diagnosis not present

## 2015-03-06 DIAGNOSIS — I10 Essential (primary) hypertension: Secondary | ICD-10-CM | POA: Diagnosis not present

## 2015-03-06 DIAGNOSIS — I499 Cardiac arrhythmia, unspecified: Secondary | ICD-10-CM | POA: Diagnosis not present

## 2015-03-06 DIAGNOSIS — Z96652 Presence of left artificial knee joint: Secondary | ICD-10-CM | POA: Diagnosis not present

## 2015-03-06 DIAGNOSIS — R269 Unspecified abnormalities of gait and mobility: Secondary | ICD-10-CM | POA: Diagnosis not present

## 2015-03-09 DIAGNOSIS — I499 Cardiac arrhythmia, unspecified: Secondary | ICD-10-CM | POA: Diagnosis not present

## 2015-03-09 DIAGNOSIS — M1990 Unspecified osteoarthritis, unspecified site: Secondary | ICD-10-CM | POA: Diagnosis not present

## 2015-03-09 DIAGNOSIS — R269 Unspecified abnormalities of gait and mobility: Secondary | ICD-10-CM | POA: Diagnosis not present

## 2015-03-09 DIAGNOSIS — Z96652 Presence of left artificial knee joint: Secondary | ICD-10-CM | POA: Diagnosis not present

## 2015-03-09 DIAGNOSIS — I10 Essential (primary) hypertension: Secondary | ICD-10-CM | POA: Diagnosis not present

## 2015-03-09 DIAGNOSIS — M25562 Pain in left knee: Secondary | ICD-10-CM | POA: Diagnosis not present

## 2015-03-13 DIAGNOSIS — K219 Gastro-esophageal reflux disease without esophagitis: Secondary | ICD-10-CM | POA: Diagnosis not present

## 2015-03-13 DIAGNOSIS — I493 Ventricular premature depolarization: Secondary | ICD-10-CM | POA: Diagnosis not present

## 2015-03-13 DIAGNOSIS — I471 Supraventricular tachycardia: Secondary | ICD-10-CM | POA: Diagnosis not present

## 2015-03-13 DIAGNOSIS — N5203 Combined arterial insufficiency and corporo-venous occlusive erectile dysfunction: Secondary | ICD-10-CM | POA: Diagnosis not present

## 2015-03-13 DIAGNOSIS — I10 Essential (primary) hypertension: Secondary | ICD-10-CM | POA: Diagnosis not present

## 2015-03-13 DIAGNOSIS — I491 Atrial premature depolarization: Secondary | ICD-10-CM | POA: Diagnosis not present

## 2015-03-13 DIAGNOSIS — J449 Chronic obstructive pulmonary disease, unspecified: Secondary | ICD-10-CM | POA: Diagnosis not present

## 2015-03-14 DIAGNOSIS — I499 Cardiac arrhythmia, unspecified: Secondary | ICD-10-CM | POA: Diagnosis not present

## 2015-03-14 DIAGNOSIS — M1990 Unspecified osteoarthritis, unspecified site: Secondary | ICD-10-CM | POA: Diagnosis not present

## 2015-03-14 DIAGNOSIS — M25562 Pain in left knee: Secondary | ICD-10-CM | POA: Diagnosis not present

## 2015-03-14 DIAGNOSIS — R269 Unspecified abnormalities of gait and mobility: Secondary | ICD-10-CM | POA: Diagnosis not present

## 2015-03-14 DIAGNOSIS — I10 Essential (primary) hypertension: Secondary | ICD-10-CM | POA: Diagnosis not present

## 2015-03-14 DIAGNOSIS — Z96652 Presence of left artificial knee joint: Secondary | ICD-10-CM | POA: Diagnosis not present

## 2015-03-16 DIAGNOSIS — I10 Essential (primary) hypertension: Secondary | ICD-10-CM | POA: Diagnosis not present

## 2015-03-16 DIAGNOSIS — M25562 Pain in left knee: Secondary | ICD-10-CM | POA: Diagnosis not present

## 2015-03-16 DIAGNOSIS — M1990 Unspecified osteoarthritis, unspecified site: Secondary | ICD-10-CM | POA: Diagnosis not present

## 2015-03-16 DIAGNOSIS — R269 Unspecified abnormalities of gait and mobility: Secondary | ICD-10-CM | POA: Diagnosis not present

## 2015-03-16 DIAGNOSIS — Z96652 Presence of left artificial knee joint: Secondary | ICD-10-CM | POA: Diagnosis not present

## 2015-03-16 DIAGNOSIS — I499 Cardiac arrhythmia, unspecified: Secondary | ICD-10-CM | POA: Diagnosis not present

## 2015-03-20 DIAGNOSIS — I10 Essential (primary) hypertension: Secondary | ICD-10-CM | POA: Diagnosis not present

## 2015-03-20 DIAGNOSIS — I499 Cardiac arrhythmia, unspecified: Secondary | ICD-10-CM | POA: Diagnosis not present

## 2015-03-20 DIAGNOSIS — Z96652 Presence of left artificial knee joint: Secondary | ICD-10-CM | POA: Diagnosis not present

## 2015-03-20 DIAGNOSIS — R269 Unspecified abnormalities of gait and mobility: Secondary | ICD-10-CM | POA: Diagnosis not present

## 2015-03-20 DIAGNOSIS — M1990 Unspecified osteoarthritis, unspecified site: Secondary | ICD-10-CM | POA: Diagnosis not present

## 2015-03-20 DIAGNOSIS — M25562 Pain in left knee: Secondary | ICD-10-CM | POA: Diagnosis not present

## 2015-04-11 DIAGNOSIS — M5412 Radiculopathy, cervical region: Secondary | ICD-10-CM | POA: Diagnosis not present

## 2015-09-01 DIAGNOSIS — Z760 Encounter for issue of repeat prescription: Secondary | ICD-10-CM | POA: Diagnosis not present

## 2015-09-01 DIAGNOSIS — J069 Acute upper respiratory infection, unspecified: Secondary | ICD-10-CM | POA: Diagnosis not present

## 2015-09-04 ENCOUNTER — Ambulatory Visit (INDEPENDENT_AMBULATORY_CARE_PROVIDER_SITE_OTHER): Payer: Medicare Other | Admitting: Family Medicine

## 2015-09-04 ENCOUNTER — Encounter: Payer: Self-pay | Admitting: Family Medicine

## 2015-09-04 ENCOUNTER — Ambulatory Visit (INDEPENDENT_AMBULATORY_CARE_PROVIDER_SITE_OTHER): Payer: Medicare Other

## 2015-09-04 VITALS — BP 157/68 | HR 73 | Wt 172.0 lb

## 2015-09-04 DIAGNOSIS — J441 Chronic obstructive pulmonary disease with (acute) exacerbation: Secondary | ICD-10-CM | POA: Diagnosis not present

## 2015-09-04 DIAGNOSIS — M899 Disorder of bone, unspecified: Secondary | ICD-10-CM

## 2015-09-04 DIAGNOSIS — R05 Cough: Secondary | ICD-10-CM | POA: Diagnosis not present

## 2015-09-04 DIAGNOSIS — M949 Disorder of cartilage, unspecified: Secondary | ICD-10-CM

## 2015-09-04 DIAGNOSIS — I10 Essential (primary) hypertension: Secondary | ICD-10-CM

## 2015-09-04 DIAGNOSIS — I480 Paroxysmal atrial fibrillation: Secondary | ICD-10-CM

## 2015-09-04 DIAGNOSIS — Z79899 Other long term (current) drug therapy: Secondary | ICD-10-CM | POA: Insufficient documentation

## 2015-09-04 DIAGNOSIS — J449 Chronic obstructive pulmonary disease, unspecified: Secondary | ICD-10-CM | POA: Insufficient documentation

## 2015-09-04 DIAGNOSIS — R739 Hyperglycemia, unspecified: Secondary | ICD-10-CM

## 2015-09-04 MED ORDER — METOPROLOL SUCCINATE ER 50 MG PO TB24: 50.0000 mg | ORAL_TABLET | Freq: Every day | ORAL | Status: DC

## 2015-09-04 MED ORDER — FLUTICASONE-SALMETEROL 115-21 MCG/ACT IN AERO: 2.0000 | INHALATION_SPRAY | Freq: Two times a day (BID) | RESPIRATORY_TRACT | Status: DC

## 2015-09-04 MED ORDER — IPRATROPIUM-ALBUTEROL 20-100 MCG/ACT IN AERS
1.0000 | INHALATION_SPRAY | Freq: Four times a day (QID) | RESPIRATORY_TRACT | Status: DC | PRN
Start: 2015-09-04 — End: 2018-08-06

## 2015-09-04 MED ORDER — ESOMEPRAZOLE MAGNESIUM 40 MG PO CPDR: 40.0000 mg | DELAYED_RELEASE_CAPSULE | Freq: Every day | ORAL | Status: DC

## 2015-09-04 MED ORDER — TRIAMTERENE-HCTZ 37.5-25 MG PO TABS: 1.0000 | ORAL_TABLET | Freq: Every day | ORAL | Status: DC

## 2015-09-04 MED ORDER — IPRATROPIUM-ALBUTEROL 0.5-2.5 (3) MG/3ML IN SOLN: 3.0000 mL | Freq: Four times a day (QID) | RESPIRATORY_TRACT | Status: DC | PRN

## 2015-09-04 MED ORDER — GUAIFENESIN-CODEINE 100-10 MG/5ML PO SOLN: 5.0000 mL | Freq: Every evening | ORAL | Status: DC | PRN

## 2015-09-04 MED ORDER — IPRATROPIUM-ALBUTEROL 0.5-2.5 (3) MG/3ML IN SOLN
3.0000 mL | Freq: Once | RESPIRATORY_TRACT | Status: AC
  Administered 2015-09-04: 3 mL via RESPIRATORY_TRACT

## 2015-09-04 MED ORDER — DIGOXIN 125 MCG PO TABS: 0.1250 mg | ORAL_TABLET | Freq: Every day | ORAL | Status: DC

## 2015-09-04 NOTE — Assessment & Plan Note (Signed)
Not well-controlled today. Check routine fasting blood work. Recheck in one week if still elevated will increase blood pressure management.

## 2015-09-04 NOTE — Progress Notes (Signed)
       Kimberly Jackson is a 70 y.o. female who presents to [facility]: [facility] today for establish care and discuss cough congestion and fatigue. Patient has had 5 days of cough congestion fatigue wheezing or shortness of breath. She was seen by urgent care where she was prescribed doxycycline and Tessalon. She has been using her inhalers which have helped some. She has a history of COPD, and paroxysmal A. fib ablation. She takes Bystolic and digoxin for paroxysmal atrial fibrillation. Patient notes that she is allergic to steroids which cause suicidal ideation.   Past Medical History  Diagnosis Date  . Depression   . Alcohol abuse   . COPD (chronic obstructive pulmonary disease) ([place])   . Reflux    Past Surgical History  Procedure Laterality Date  . Abdominal hysterectomy    . Appendectomy    . Tonsillectomy    . Cholecystectomy    . Rotator cuff repair    . Replacement total knee     Social History  Substance Use Topics  . Smoking status: Never Smoker   . Smokeless tobacco: Not on file  . Alcohol Use: No   family history includes Alcohol abuse in her mother.  ROS as above Medications: Current Outpatient Prescriptions  Medication Sig Dispense Refill  . benzonatate (TESSALON) 100 MG capsule   0  . digoxin (LANOXIN) 0.125 MG tablet Take 1 tablet (0.125 mg total) by mouth daily. 30 tablet 1  . doxycycline (VIBRA-TABS) 100 MG tablet TK 1 T PO  BID  0  . esomeprazole (NEXIUM) 40 MG capsule Take 1 capsule (40 mg total) by mouth daily at 12 noon. 30 capsule 1  . fluticasone (FLONASE) 50 MCG/ACT nasal spray SPRAY 1 SPRAY IN EACH NOSTRILS BID  0  . fluticasone-salmeterol (ADVAIR HFA) 115-21 MCG/ACT inhaler Inhale 2 puffs into the lungs 2 (two) times daily. 1 Inhaler 6  . triamterene-hydrochlorothiazide (MAXZIDE-25) 37.5-25 MG tablet Take 1 tablet by mouth daily. 30 tablet 1  .  guaiFENesin-codeine 100-10 MG/5ML syrup Take 5 mLs by mouth at bedtime as needed for cough. 120 mL 0  . Ipratropium-Albuterol (COMBIVENT) 20-100 MCG/ACT AERS respimat Inhale 1 puff into the lungs every 6 (six) hours as needed for wheezing or shortness of breath. 1 Inhaler 5  . ipratropium-albuterol (DUONEB) 0.5-2.5 (3) MG/3ML SOLN Take 3 mLs by nebulization every 6 (six) hours as needed. 360 mL 1  . metoprolol succinate (TOPROL-XL) 50 MG 24 hr tablet Take 1 tablet (50 mg total) by mouth daily. Take with or immediately following a meal. 30 tablet 1   No current facility-administered medications for this visit.   Allergies  Allergen Reactions  . Cobalt-Mn [Manganese]   . Compazine [Prochlorperazine]   . Eggs Or Egg-Derived Products   . Penicillins      Exam:  BP 157/68 mmHg  Pulse 73  Wt 172 lb (78.019 kg) Gen: Well NAD HEENT: EOMI,  MMM Lungs: Normal work of breathing. Prolonged expiratory phase and wheezing present bilaterally. Heart: RRR no MRG Abd: NABS, Soft. Nondistended, Nontender Exts: Brisk capillary refill, warm and well perfused.   Patient was given a DuoNeb nebulizer treatment and felt better.  Chest x-ray pending.  No results found for this or any previous visit (from the past 24 hour(s)). No results found.   Please see individual assessment and plan sections.

## 2015-09-04 NOTE — Patient Instructions (Signed)
Thank you for coming in today. Get xray today.  Take doxycycline.  Use inhaler.  STOP bystolic.  Start metoporol.  Return in 1 week.   Call or go to the emergency room if you get worse, have trouble breathing, have chest pains, or palpitations.   Chronic Obstructive Pulmonary Disease Exacerbation Chronic obstructive pulmonary disease (COPD) is a common lung condition in which airflow from the lungs is limited. COPD is a general term that can be used to describe many different lung problems that limit airflow, including chronic bronchitis and emphysema. COPD exacerbations are episodes when breathing symptoms become much worse and require extra treatment. Without treatment, COPD exacerbations can be life threatening, and frequent COPD exacerbations can cause further damage to your lungs. CAUSES  Respiratory infections.  Exposure to smoke.  Exposure to air pollution, chemical fumes, or dust. Sometimes there is no apparent cause or trigger. RISK FACTORS  Smoking cigarettes.  Older age.  Frequent prior COPD exacerbations. SIGNS AND SYMPTOMS  Increased coughing.  Increased thick spit (sputum) production.  Increased wheezing.  Increased shortness of breath.  Rapid breathing.  Chest tightness. DIAGNOSIS Your medical history, a physical exam, and tests will help your health care provider make a diagnosis. Tests may include:  A chest X-ray.  Basic lab tests.  Sputum testing.  An arterial blood gas test. TREATMENT Depending on the severity of your COPD exacerbation, you may need to be admitted to a hospital for treatment. Some of the treatments commonly used to treat COPD exacerbations are:   Antibiotic medicines.  Bronchodilators. These are drugs that expand the air passages. They may be given with an inhaler or nebulizer. Spacer devices may be needed to help improve drug delivery.  Corticosteroid medicines.  Supplemental oxygen therapy.  Airway clearing techniques,  such as noninvasive ventilation (NIV) and positive expiratory pressure (PEP). These provide respiratory support through a mask or other noninvasive device. HOME CARE INSTRUCTIONS  Do not smoke. Quitting smoking is very important to prevent COPD from getting worse and exacerbations from happening as often.  Avoid exposure to all substances that irritate the airway, especially to tobacco smoke.  If you were prescribed an antibiotic medicine, finish it all even if you start to feel better.  Take all medicines as directed by your health care provider.It is important to use correct technique with inhaled medicines.  Drink enough fluids to keep your urine clear or pale yellow (unless you have a medical condition that requires fluid restriction).  Use a cool mist vaporizer. This makes it easier to clear your chest when you cough.  If you have a home nebulizer and oxygen, continue to use them as directed.  Maintain all necessary vaccinations to prevent infections.  Exercise regularly.  Eat a healthy diet.  Keep all follow-up appointments as directed by your health care provider. SEEK IMMEDIATE MEDICAL CARE IF:  You have worsening shortness of breath.  You have trouble talking.  You have severe chest pain.  You have blood in your sputum.  You have a fever.  You have weakness, vomit repeatedly, or faint.  You feel confused.  You continue to get worse. MAKE SURE YOU:  Understand these instructions.  Will watch your condition.  Will get help right away if you are not doing well or get worse.   This information is not intended to replace advice given to you by your health care provider. Make sure you discuss any questions you have with your health care provider.   Document Released:  05/05/2007 Document Revised: 07/29/2014 Document Reviewed: 03/12/2013 Elsevier Interactive Patient Education [employer].

## 2015-09-04 NOTE — Assessment & Plan Note (Signed)
Symptoms consistent with COPD exacerbation. Patient is intolerant to oral steroids. Plan to treat with nebulizers and inhalers. Continue doxycycline. Prescribed codeine cough syrup. X-ray pending. Recheck in 1 week.

## 2015-09-04 NOTE — Assessment & Plan Note (Signed)
Seems well controlled. Check digoxin level. Switch away from Bystolic and onto metoprolol for more beta selective blockade. Recheck 1 week.

## 2015-09-05 NOTE — Progress Notes (Signed)
Quick Note:  No pneumonia ______

## 2015-09-07 DIAGNOSIS — Z79899 Other long term (current) drug therapy: Secondary | ICD-10-CM | POA: Diagnosis not present

## 2015-09-07 DIAGNOSIS — R739 Hyperglycemia, unspecified: Secondary | ICD-10-CM | POA: Diagnosis not present

## 2015-09-07 DIAGNOSIS — I10 Essential (primary) hypertension: Secondary | ICD-10-CM | POA: Diagnosis not present

## 2015-09-07 DIAGNOSIS — R7309 Other abnormal glucose: Secondary | ICD-10-CM | POA: Diagnosis not present

## 2015-09-07 DIAGNOSIS — I4891 Unspecified atrial fibrillation: Secondary | ICD-10-CM | POA: Diagnosis not present

## 2015-09-07 DIAGNOSIS — M949 Disorder of cartilage, unspecified: Secondary | ICD-10-CM | POA: Diagnosis not present

## 2015-09-07 DIAGNOSIS — I480 Paroxysmal atrial fibrillation: Secondary | ICD-10-CM | POA: Diagnosis not present

## 2015-09-07 DIAGNOSIS — M899 Disorder of bone, unspecified: Secondary | ICD-10-CM | POA: Diagnosis not present

## 2015-09-07 LAB — HEMOGLOBIN A1C
HEMOGLOBIN A1C: 5.7 % — AB (ref <5.7)
MEAN PLASMA GLUCOSE: 117 mg/dL — AB (ref <117)

## 2015-09-07 LAB — CBC
HCT: 40.1 % (ref 36.0–46.0)
Hemoglobin: 13.7 g/dL (ref 12.0–15.0)
MCH: 28.9 pg (ref 26.0–34.0)
MCHC: 34.2 g/dL (ref 30.0–36.0)
MCV: 84.6 fL (ref 78.0–100.0)
MPV: 9.7 fL (ref 8.6–12.4)
PLATELETS: 308 10*3/uL (ref 150–400)
RBC: 4.74 MIL/uL (ref 3.87–5.11)
RDW: 14 % (ref 11.5–15.5)
WBC: 4.9 10*3/uL (ref 4.0–10.5)

## 2015-09-08 ENCOUNTER — Encounter: Payer: Self-pay | Admitting: Family Medicine

## 2015-09-08 DIAGNOSIS — K439 Ventral hernia without obstruction or gangrene: Secondary | ICD-10-CM | POA: Insufficient documentation

## 2015-09-08 DIAGNOSIS — R7303 Prediabetes: Secondary | ICD-10-CM | POA: Insufficient documentation

## 2015-09-08 DIAGNOSIS — M8580 Other specified disorders of bone density and structure, unspecified site: Secondary | ICD-10-CM | POA: Insufficient documentation

## 2015-09-08 LAB — COMPREHENSIVE METABOLIC PANEL
ALT: 20 U/L (ref 6–29)
AST: 19 U/L (ref 10–35)
Albumin: 3.9 g/dL (ref 3.6–5.1)
Alkaline Phosphatase: 103 U/L (ref 33–130)
BILIRUBIN TOTAL: 0.8 mg/dL (ref 0.2–1.2)
BUN: 15 mg/dL (ref 7–25)
CHLORIDE: 101 mmol/L (ref 98–110)
CO2: 27 mmol/L (ref 20–31)
CREATININE: 0.71 mg/dL (ref 0.60–0.93)
Calcium: 8.5 mg/dL — ABNORMAL LOW (ref 8.6–10.4)
GLUCOSE: 104 mg/dL — AB (ref 65–99)
Potassium: 3.5 mmol/L (ref 3.5–5.3)
SODIUM: 139 mmol/L (ref 135–146)
Total Protein: 6.8 g/dL (ref 6.1–8.1)

## 2015-09-08 LAB — LIPID PANEL
Cholesterol: 168 mg/dL (ref 125–200)
HDL: 45 mg/dL — AB (ref >=46)
LDL CALC: 98 mg/dL (ref <130)
Total CHOL/HDL Ratio: 3.7 Ratio (ref <=5.0)
Triglycerides: 127 mg/dL (ref <150)
VLDL: 25 mg/dL (ref <30)

## 2015-09-08 LAB — TSH: TSH: 1.48 m[IU]/L

## 2015-09-08 LAB — DIGOXIN LEVEL: Digoxin Level: 0.5 ug/L — ABNORMAL LOW (ref 0.8–2.0)

## 2015-09-08 LAB — VITAMIN D 25 HYDROXY (VIT D DEFICIENCY, FRACTURES): Vit D, 25-Hydroxy: 13 ng/mL — ABNORMAL LOW (ref 30–100)

## 2015-09-08 NOTE — Progress Notes (Signed)
Quick Note:  1) Digoxin level is not high.  2) Vitamin D deficiency noted. Take 5000 units of vitamin D daily over-the-counter.  3) Prediabetes. Work on diet and exercise and weight loss.   ______

## 2015-09-11 ENCOUNTER — Ambulatory Visit (INDEPENDENT_AMBULATORY_CARE_PROVIDER_SITE_OTHER): Payer: Medicare Other | Admitting: Family Medicine

## 2015-09-11 ENCOUNTER — Encounter: Payer: Self-pay | Admitting: Family Medicine

## 2015-09-11 VITALS — BP 149/69 | HR 78 | Temp 98.1°F | Wt 175.0 lb

## 2015-09-11 DIAGNOSIS — I10 Essential (primary) hypertension: Secondary | ICD-10-CM

## 2015-09-11 DIAGNOSIS — E559 Vitamin D deficiency, unspecified: Secondary | ICD-10-CM | POA: Diagnosis not present

## 2015-09-11 DIAGNOSIS — J441 Chronic obstructive pulmonary disease with (acute) exacerbation: Secondary | ICD-10-CM

## 2015-09-11 NOTE — Assessment & Plan Note (Signed)
Blood pressure elevated but better than last visit. Recheck pressure in one month.

## 2015-09-11 NOTE — Assessment & Plan Note (Signed)
Improved. Intolerant of prednisone. Recheck 1 month.

## 2015-09-11 NOTE — Assessment & Plan Note (Signed)
Currently taking vitamin D. Recheck level in a few months.

## 2015-09-11 NOTE — Progress Notes (Signed)
       Kimberly Jackson is a 70 y.o. female who presents to [facility]: [facility] today for follow-up COPD exacerbation. At the last visit patient was found to have COPD exacerbation. Unfortunately she does not tolerate oral steroids because she's had a history of suicidality with these medicines. She notes improvement with doxycycline and multiple different inhalers and nebulizers. She is improved but not all the way better. She denies any fevers chills vomiting or diarrhea.  Additionally the last visit patient was found to have vitamin D deficiency exacerbating her sustaining osteopenia. She started taking 5000 units of vitamin D daily. She tolerates the medicine well.   Past Medical History  Diagnosis Date  . Depression   . Alcohol abuse   . COPD (chronic obstructive pulmonary disease) ([place])   . Reflux    Past Surgical History  Procedure Laterality Date  . Abdominal hysterectomy    . Appendectomy      70 yo  . Tonsillectomy    . Cholecystectomy    . Rotator cuff repair    . Replacement total knee    . Bladder surgery  2015   Social History  Substance Use Topics  . Smoking status: Never Smoker   . Smokeless tobacco: Not on file  . Alcohol Use: No   family history includes Alcohol abuse in her mother.  ROS as above Medications: Current Outpatient Prescriptions  Medication Sig Dispense Refill  . digoxin (LANOXIN) 0.125 MG tablet Take 1 tablet (0.125 mg total) by mouth daily. 30 tablet 1  . doxycycline (VIBRA-TABS) 100 MG tablet TK 1 T PO  BID  0  . esomeprazole (NEXIUM) 40 MG capsule Take 1 capsule (40 mg total) by mouth daily at 12 noon. 30 capsule 1  . fluticasone (FLONASE) 50 MCG/ACT nasal spray SPRAY 1 SPRAY IN EACH NOSTRILS BID  0  . fluticasone-salmeterol (ADVAIR HFA) 115-21 MCG/ACT inhaler Inhale 2 puffs into the lungs 2 (two) times daily. 1 Inhaler 6  . guaiFENesin-codeine 100-10  MG/5ML syrup Take 5 mLs by mouth at bedtime as needed for cough. 120 mL 0  . Ipratropium-Albuterol (COMBIVENT) 20-100 MCG/ACT AERS respimat Inhale 1 puff into the lungs every 6 (six) hours as needed for wheezing or shortness of breath. 1 Inhaler 5  . ipratropium-albuterol (DUONEB) 0.5-2.5 (3) MG/3ML SOLN Take 3 mLs by nebulization every 6 (six) hours as needed. 360 mL 1  . metoprolol succinate (TOPROL-XL) 50 MG 24 hr tablet Take 1 tablet (50 mg total) by mouth daily. Take with or immediately following a meal. 30 tablet 1  . triamterene-hydrochlorothiazide (MAXZIDE-25) 37.5-25 MG tablet Take 1 tablet by mouth daily. 30 tablet 1   No current facility-administered medications for this visit.   Allergies  Allergen Reactions  . Cobalt-Mn [Manganese]   . Compazine [Prochlorperazine]   . Corticosteroids   . Eggs Or Egg-Derived Products   . Penicillins      Exam:  BP 149/69 mmHg  Pulse 78  Temp(Src) 98.1 F (36.7 C) (Oral)  Wt 175 lb (79.379 kg)  SpO2 98% Gen: Well NAD HEENT: EOMI,  MMM Lungs: Normal work of breathing. Coarse breath sounds bilaterally Heart: RRR no MRG Abd: NABS, Soft. Nondistended, Nontender Exts: Brisk capillary refill, warm and well perfused.   No results found for this or any previous visit (from the past 24 hour(s)). No results found.   Please see individual assessment and plan sections.

## 2015-09-11 NOTE — Patient Instructions (Signed)
Thank you for coming in today. Return in 1 month.  Return sooner if needed.

## 2015-10-09 ENCOUNTER — Ambulatory Visit (INDEPENDENT_AMBULATORY_CARE_PROVIDER_SITE_OTHER): Payer: Medicare Other | Admitting: Family Medicine

## 2015-10-09 ENCOUNTER — Encounter: Payer: Self-pay | Admitting: Family Medicine

## 2015-10-09 VITALS — BP 149/74 | HR 61 | Wt 178.0 lb

## 2015-10-09 DIAGNOSIS — H6122 Impacted cerumen, left ear: Secondary | ICD-10-CM

## 2015-10-09 DIAGNOSIS — I10 Essential (primary) hypertension: Secondary | ICD-10-CM | POA: Diagnosis not present

## 2015-10-09 MED ORDER — METOPROLOL SUCCINATE ER 100 MG PO TB24: 100.0000 mg | ORAL_TABLET | Freq: Every day | ORAL | Status: DC

## 2015-10-09 NOTE — Progress Notes (Signed)
Kimberly Jackson is a 70 y.o. female who presents to [facility]: [facility] today for follow-up hypertension and discuss left ear pain.  1) hypertension previously managed with Toprol and Maxide. No chest pains palpitations or shortness of breath.  2) right ear pain: Ongoing now for years worsening recently associated decreased hearing. Patient notes she has a history of TMJ and cerumen impactions. She's worried that she may have a cerumen impaction.    Past Medical History  Diagnosis Date  . Depression   . Alcohol abuse   . COPD (chronic obstructive pulmonary disease) ([place])   . Reflux    Past Surgical History  Procedure Laterality Date  . Abdominal hysterectomy    . Appendectomy      70 yo  . Tonsillectomy    . Cholecystectomy    . Rotator cuff repair    . Replacement total knee    . Bladder surgery  2015   Social History  Substance Use Topics  . Smoking status: Never Smoker   . Smokeless tobacco: Not on file  . Alcohol Use: No   family history includes Alcohol abuse in her mother.  ROS as above Medications: Current Outpatient Prescriptions  Medication Sig Dispense Refill  . Cholecalciferol (VITAMIN D3) 5000 units CAPS Take 5,000 Units by mouth daily.    . digoxin (LANOXIN) 0.125 MG tablet Take 1 tablet (0.125 mg total) by mouth daily. 30 tablet 1  . Diphenhydramine-APAP, sleep, (PAIN RELIEF PM PO) Take by mouth.    . doxycycline (VIBRA-TABS) 100 MG tablet TK 1 T PO  BID  0  . esomeprazole (NEXIUM) 40 MG capsule Take 1 capsule (40 mg total) by mouth daily at 12 noon. 30 capsule 1  . fluticasone (FLONASE) 50 MCG/ACT nasal spray SPRAY 1 SPRAY IN EACH NOSTRILS BID  0  . fluticasone-salmeterol (ADVAIR HFA) 115-21 MCG/ACT inhaler Inhale 2 puffs into the lungs 2 (two) times daily. 1 Inhaler 6  . Ipratropium-Albuterol (COMBIVENT) 20-100 MCG/ACT AERS respimat Inhale 1 puff into the  lungs every 6 (six) hours as needed for wheezing or shortness of breath. 1 Inhaler 5  . ipratropium-albuterol (DUONEB) 0.5-2.5 (3) MG/3ML SOLN Take 3 mLs by nebulization every 6 (six) hours as needed. 360 mL 1  . metoprolol succinate (TOPROL-XL) 100 MG 24 hr tablet Take 1 tablet (100 mg total) by mouth daily. Take with or immediately following a meal. 30 tablet 1  . triamterene-hydrochlorothiazide (MAXZIDE-25) 37.5-25 MG tablet Take 1 tablet by mouth daily. 30 tablet 1   No current facility-administered medications for this visit.   Allergies  Allergen Reactions  . Cobalt-Mn [Manganese]   . Compazine [Prochlorperazine]   . Corticosteroids   . Eggs Or Egg-Derived Products   . Penicillins      Exam:  BP 149/74 mmHg  Pulse 61  Wt 178 lb (80.74 kg)  SpO2 100% Gen: Well NAD HEENT: EOMI,  MMM Left panic membrane occluded by cerumen. Mildly tender to touch left TMJ with jaw motion. Palpable clicking present Lungs: Normal work of breathing. CTABL Heart: RRR no MRG Abd: NABS, Soft. Nondistended, Nontender Exts: Brisk capillary refill, warm and well perfused.   Cerumen irrigated patient experienced improvement in symptoms  No results found for this or any previous visit (from the past 24 hour(s)). No results found.   70 year old woman with elevated blood pressure. Increase metoprolol to 100 mg XL daily. Recheck in one month.  Ear pain likely due to cerumen  impaction and TMJ. Recommend bite guard. Cerumen irrigated today.

## 2015-10-09 NOTE — Patient Instructions (Signed)
Thank you for coming in today. Increase the metoprolol to 100 mg daily Return in one month  Cerumen Impaction The structures of the external ear canal secrete a waxy substance known as cerumen. Excess cerumen can build up in the ear canal, causing a condition known as cerumen impaction. Cerumen impaction can cause ear pain and disrupt the function of the ear. The rate of cerumen production differs for each individual. In certain individuals, the configuration of the ear canal may decrease his or her ability to naturally remove cerumen. CAUSES Cerumen impaction is caused by excessive cerumen production or buildup. RISK FACTORS  Frequent use of swabs to clean ears.  Having narrow ear canals.  Having eczema.  Being dehydrated. SIGNS AND SYMPTOMS  Diminished hearing.  Ear drainage.  Ear pain.  Ear itch. TREATMENT Treatment may involve:  Over-the-counter or prescription ear drops to soften the cerumen.  Removal of cerumen by a health care provider. This may be done with:  Irrigation with warm water. This is the most common method of removal.  Ear curettes and other instruments.  Surgery. This may be done in severe cases. HOME CARE INSTRUCTIONS  Take medicines only as directed by your health care provider.  Do not insert objects into the ear with the intent of cleaning the ear. PREVENTION  Do not insert objects into the ear, even with the intent of cleaning the ear. Removing cerumen as a part of normal hygiene is not necessary, and the use of swabs in the ear canal is not recommended.  Drink enough water to keep your urine clear or pale yellow.  Control your eczema if you have it. SEEK MEDICAL CARE IF:  You develop ear pain.  You develop bleeding from the ear.  The cerumen does not clear after you use ear drops as directed.   This information is not intended to replace advice given to you by your health care provider. Make sure you discuss any questions you have  with your health care provider.   Document Released: 08/15/2004 Document Revised: 07/29/2014 Document Reviewed: 02/22/2015 Elsevier Interactive Patient Education [employer].

## 2015-11-09 ENCOUNTER — Ambulatory Visit (INDEPENDENT_AMBULATORY_CARE_PROVIDER_SITE_OTHER): Payer: Medicare Other | Admitting: Family Medicine

## 2015-11-09 ENCOUNTER — Encounter: Payer: Self-pay | Admitting: Family Medicine

## 2015-11-09 VITALS — BP 150/66 | HR 56 | Wt 184.0 lb

## 2015-11-09 DIAGNOSIS — I10 Essential (primary) hypertension: Secondary | ICD-10-CM

## 2015-11-09 NOTE — Progress Notes (Signed)
       Kimberly Jackson is a 70 y.o. female who presents to [facility]: [facility] today for follow-up hypertension. Patient was seen last month to have elevated blood pressure and her metoprolol was increased. In the interim she feels well with no episodes of lightheadedness this or dizziness. She has not been checking her home blood pressure. She notes that she's not been taking her blood pressure medications regularly. She notes that she did take some today. She denies any fevers chills nausea vomiting or diarrhea.   Past Medical History  Diagnosis Date  . Depression   . Alcohol abuse   . COPD (chronic obstructive pulmonary disease) ([place])   . Reflux    Past Surgical History  Procedure Laterality Date  . Abdominal hysterectomy    . Appendectomy      70 yo  . Tonsillectomy    . Cholecystectomy    . Rotator cuff repair    . Replacement total knee    . Bladder surgery  2015   Social History  Substance Use Topics  . Smoking status: Never Smoker   . Smokeless tobacco: Not on file  . Alcohol Use: No   family history includes Alcohol abuse in her mother.  ROS as above Medications: Current Outpatient Prescriptions  Medication Sig Dispense Refill  . Cholecalciferol (VITAMIN D3) 5000 units CAPS Take 5,000 Units by mouth daily.    . digoxin (LANOXIN) 0.125 MG tablet Take 1 tablet (0.125 mg total) by mouth daily. 30 tablet 1  . esomeprazole (NEXIUM) 40 MG capsule Take 1 capsule (40 mg total) by mouth daily at 12 noon. 30 capsule 1  . fluticasone (FLONASE) 50 MCG/ACT nasal spray SPRAY 1 SPRAY IN EACH NOSTRILS BID  0  . fluticasone-salmeterol (ADVAIR HFA) 115-21 MCG/ACT inhaler Inhale 2 puffs into the lungs 2 (two) times daily. 1 Inhaler 6  . Ipratropium-Albuterol (COMBIVENT) 20-100 MCG/ACT AERS respimat Inhale 1 puff into the lungs every 6 (six) hours as needed for wheezing or shortness of breath. 1  Inhaler 5  . ipratropium-albuterol (DUONEB) 0.5-2.5 (3) MG/3ML SOLN Take 3 mLs by nebulization every 6 (six) hours as needed. 360 mL 1  . metoprolol succinate (TOPROL-XL) 100 MG 24 hr tablet Take 1 tablet (100 mg total) by mouth daily. Take with or immediately following a meal. 30 tablet 1  . triamterene-hydrochlorothiazide (MAXZIDE-25) 37.5-25 MG tablet Take 1 tablet by mouth daily. 30 tablet 1   No current facility-administered medications for this visit.   Allergies  Allergen Reactions  . Cobalt-Mn [Manganese]   . Compazine [Prochlorperazine]   . Corticosteroids   . Eggs Or Egg-Derived Products   . Penicillins      Exam:  BP 150/66 mmHg  Pulse 56  Wt 184 lb (83.462 kg) Gen: Well NAD HEENT: EOMI,  MMM Lungs: Normal work of breathing. CTABL Heart: RRR no MRG Abd: NABS, Soft. Nondistended, Nontender Exts: Brisk capillary refill, warm and well perfused.   No results found for this or any previous visit (from the past 24 hour(s)). No results found.   Please see individual assessment and plan sections.

## 2015-11-09 NOTE — Patient Instructions (Signed)
Thank you for coming in today. Continue the CPAP machine.  Check blood pressure at home.  Return in 1 month.

## 2015-11-09 NOTE — Assessment & Plan Note (Signed)
Not very well controlled. Plan to be more adherent to blood pressure medications keep a blood pressure log and recheck in a month. If still elevated will add probably an ACE inhibitor or an angiotensin receptor blocker.

## 2015-11-29 ENCOUNTER — Other Ambulatory Visit: Payer: Self-pay | Admitting: *Deleted

## 2015-11-29 MED ORDER — METOPROLOL SUCCINATE ER 100 MG PO TB24: 100.0000 mg | ORAL_TABLET | Freq: Every day | ORAL | Status: DC

## 2015-12-07 ENCOUNTER — Encounter: Payer: Self-pay | Admitting: Family Medicine

## 2015-12-07 ENCOUNTER — Ambulatory Visit (INDEPENDENT_AMBULATORY_CARE_PROVIDER_SITE_OTHER): Payer: Medicare Other | Admitting: Family Medicine

## 2015-12-07 VITALS — BP 156/80 | HR 61 | Wt 181.0 lb

## 2015-12-07 DIAGNOSIS — I10 Essential (primary) hypertension: Secondary | ICD-10-CM

## 2015-12-07 DIAGNOSIS — M79671 Pain in right foot: Secondary | ICD-10-CM

## 2015-12-07 DIAGNOSIS — M8580 Other specified disorders of bone density and structure, unspecified site: Secondary | ICD-10-CM

## 2015-12-07 DIAGNOSIS — E559 Vitamin D deficiency, unspecified: Secondary | ICD-10-CM

## 2015-12-07 DIAGNOSIS — J410 Simple chronic bronchitis: Secondary | ICD-10-CM

## 2015-12-07 MED ORDER — LISINOPRIL 10 MG PO TABS: 10.0000 mg | ORAL_TABLET | Freq: Every day | ORAL | Status: DC

## 2015-12-07 NOTE — Progress Notes (Signed)
       Kimberly Jackson is a 70 y.o. female who presents to [facility]: [facility] today for follow-up hypertension. Patient notes that she's feeling pretty well overall. No chest pains palpitations or shortness of breath. She currently takes triamterene/hydrochlorothiazide, and metoprolol.   She does note some right foot pain especially felt in the midfoot worse with activity and better with rest. No injury radiating pain weakness or numbness. Currently she is pain-free.  Breathing: Improved. No shortness of breath cough or congestion. She uses albuterol inhaler very infrequently.   Past Medical History  Diagnosis Date  . Depression   . Alcohol abuse   . COPD (chronic obstructive pulmonary disease) ([place])   . Reflux    Past Surgical History  Procedure Laterality Date  . Abdominal hysterectomy    . Appendectomy      70 yo  . Tonsillectomy    . Cholecystectomy    . Rotator cuff repair    . Replacement total knee    . Bladder surgery  2015   Social History  Substance Use Topics  . Smoking status: Never Smoker   . Smokeless tobacco: Not on file  . Alcohol Use: No   family history includes Alcohol abuse in her mother.  ROS as above Medications: Current Outpatient Prescriptions  Medication Sig Dispense Refill  . Cholecalciferol (VITAMIN D3) 5000 units CAPS Take 5,000 Units by mouth daily.    . digoxin (LANOXIN) 0.125 MG tablet Take 1 tablet (0.125 mg total) by mouth daily. 30 tablet 1  . esomeprazole (NEXIUM) 40 MG capsule Take 1 capsule (40 mg total) by mouth daily at 12 noon. 30 capsule 1  . fluticasone (FLONASE) 50 MCG/ACT nasal spray SPRAY 1 SPRAY IN EACH NOSTRILS BID  0  . fluticasone-salmeterol (ADVAIR HFA) 115-21 MCG/ACT inhaler Inhale 2 puffs into the lungs 2 (two) times daily. 1 Inhaler 6  . Ipratropium-Albuterol (COMBIVENT) 20-100 MCG/ACT AERS respimat Inhale 1 puff into the lungs  every 6 (six) hours as needed for wheezing or shortness of breath. 1 Inhaler 5  . ipratropium-albuterol (DUONEB) 0.5-2.5 (3) MG/3ML SOLN Take 3 mLs by nebulization every 6 (six) hours as needed. 360 mL 1  . metoprolol succinate (TOPROL-XL) 100 MG 24 hr tablet Take 1 tablet (100 mg total) by mouth daily. Take with or immediately following a meal. 30 tablet 1  . triamterene-hydrochlorothiazide (MAXZIDE-25) 37.5-25 MG tablet Take 1 tablet by mouth daily. 30 tablet 1  . lisinopril (PRINIVIL,ZESTRIL) 10 MG tablet Take 1 tablet (10 mg total) by mouth daily. 30 tablet 1   No current facility-administered medications for this visit.   Allergies  Allergen Reactions  . Cobalt-Mn [Manganese]   . Compazine [Prochlorperazine]   . Corticosteroids   . Eggs Or Egg-Derived Products   . Penicillins      Exam:  BP 156/80 mmHg  Pulse 61  Wt 181 lb (82.101 kg) Gen: Well NAD HEENT: EOMI,  MMM Lungs: Normal work of breathing. CTABL Heart: RRR no MRG Abd: NABS, Soft. Nondistended, Nontender Exts: Brisk capillary refill, warm and well perfused.  Right foot: Tarsometatarsal bossing present throughout the midfoot. Nontender normal motion pulses capillary refill and sensation are intact.  No results found for this or any previous visit (from the past 24 hour(s)). No results found.   Please see individual assessment and plan sections.

## 2015-12-07 NOTE — Assessment & Plan Note (Signed)
Not fully controlled. Add lisinopril. Check metabolic panel in 1 week.

## 2015-12-07 NOTE — Assessment & Plan Note (Signed)
Likely due to DJD. Plan for watchful waiting consider orthotics as needed.

## 2015-12-07 NOTE — Patient Instructions (Signed)
Thank you for coming in today. Start lisinopril blood pressure medicine.  Recheck labs in 1 week.  Return in 1 month.  Call or go to the emergency room if you get worse, have trouble breathing, have chest pains, or palpitations.

## 2015-12-07 NOTE — Assessment & Plan Note (Signed)
- 

## 2015-12-07 NOTE — Assessment & Plan Note (Signed)
Doing reasonably well. Continue Advair and use albuterol/ipratropium as needed.

## 2016-01-01 ENCOUNTER — Other Ambulatory Visit: Payer: Self-pay | Admitting: Family Medicine

## 2016-01-04 ENCOUNTER — Ambulatory Visit (INDEPENDENT_AMBULATORY_CARE_PROVIDER_SITE_OTHER): Payer: Medicare Other | Admitting: Family Medicine

## 2016-01-04 ENCOUNTER — Encounter: Payer: Self-pay | Admitting: Family Medicine

## 2016-01-04 ENCOUNTER — Ambulatory Visit (INDEPENDENT_AMBULATORY_CARE_PROVIDER_SITE_OTHER): Payer: Medicare Other

## 2016-01-04 VITALS — BP 144/78 | HR 58 | Wt 184.0 lb

## 2016-01-04 DIAGNOSIS — M19071 Primary osteoarthritis, right ankle and foot: Secondary | ICD-10-CM | POA: Diagnosis not present

## 2016-01-04 DIAGNOSIS — Z23 Encounter for immunization: Secondary | ICD-10-CM | POA: Diagnosis not present

## 2016-01-04 DIAGNOSIS — I10 Essential (primary) hypertension: Secondary | ICD-10-CM | POA: Diagnosis not present

## 2016-01-04 DIAGNOSIS — M79671 Pain in right foot: Secondary | ICD-10-CM

## 2016-01-04 DIAGNOSIS — M25571 Pain in right ankle and joints of right foot: Secondary | ICD-10-CM

## 2016-01-04 MED ORDER — ZOSTER VACCINE LIVE 19400 UNT/0.65ML ~~LOC~~ SUSR: 0.6500 mL | Freq: Once | SUBCUTANEOUS | Status: DC

## 2016-01-04 MED ORDER — DIGOXIN 125 MCG PO TABS
0.1250 mg | ORAL_TABLET | Freq: Every day | ORAL | Status: DC
Start: 2016-01-04 — End: 2016-04-07

## 2016-01-04 MED ORDER — LISINOPRIL 10 MG PO TABS: 10.0000 mg | ORAL_TABLET | Freq: Every day | ORAL | Status: DC

## 2016-01-04 NOTE — Progress Notes (Signed)
Manda Shouse Garity is a 70 y.o. female who presents to [facility]: [facility] today for follow-up hypertension and to discuss right foot pain.  Hypertension: Patient was seen at the last visit and her blood pressure was not well-controlled. She was prescribed lisinopril. She couldn't figure out which pharmacy the medication was sent to those never started taking it. She denies any chest pains palpitations or shortness of breath.  Additionally she notes continued right foot pain. Patient has pain is worse in the evening after a long day of walking. She denies any specific injury. Pain is typically located in the medial portion of the dorsal mid and distal foot. She denies any swelling fevers or chills nausea vomiting or diarrhea. She takes Tylenol PM which helps some.   Past Medical History  Diagnosis Date  . Depression   . Alcohol abuse   . COPD (chronic obstructive pulmonary disease) ([place])   . Reflux    Past Surgical History  Procedure Laterality Date  . Abdominal hysterectomy    . Appendectomy      70 yo  . Tonsillectomy    . Cholecystectomy    . Rotator cuff repair    . Replacement total knee    . Bladder surgery  2015   Social History  Substance Use Topics  . Smoking status: Never Smoker   . Smokeless tobacco: Not on file  . Alcohol Use: No   family history includes Alcohol abuse in her mother.  ROS as above:  Medications: Current Outpatient Prescriptions  Medication Sig Dispense Refill  . Cholecalciferol (VITAMIN D3) 5000 units CAPS Take 5,000 Units by mouth daily.    . digoxin (LANOXIN) 0.125 MG tablet Take 1 tablet (0.125 mg total) by mouth daily. 90 tablet 0  . esomeprazole (NEXIUM) 40 MG capsule Take 1 capsule (40 mg total) by mouth daily at 12 noon. 30 capsule 1  . fluticasone (FLONASE) 50 MCG/ACT nasal spray SPRAY 1 SPRAY IN EACH NOSTRILS BID  0  .  fluticasone-salmeterol (ADVAIR HFA) 115-21 MCG/ACT inhaler Inhale 2 puffs into the lungs 2 (two) times daily. 1 Inhaler 6  . Ipratropium-Albuterol (COMBIVENT) 20-100 MCG/ACT AERS respimat Inhale 1 puff into the lungs every 6 (six) hours as needed for wheezing or shortness of breath. 1 Inhaler 5  . ipratropium-albuterol (DUONEB) 0.5-2.5 (3) MG/3ML SOLN Take 3 mLs by nebulization every 6 (six) hours as needed. 360 mL 1  . metoprolol succinate (TOPROL-XL) 100 MG 24 hr tablet Take 1 tablet (100 mg total) by mouth daily. Take with or immediately following a meal. 30 tablet 1  . triamterene-hydrochlorothiazide (MAXZIDE-25) 37.5-25 MG tablet Take 1 tablet by mouth daily. 30 tablet 1  . lisinopril (PRINIVIL,ZESTRIL) 10 MG tablet Take 1 tablet (10 mg total) by mouth daily. 90 tablet 0  . Zoster Vaccine Live, PF, (ZOSTAVAX) 09811 UNT/0.65ML injection Inject 19,400 Units into the skin once. If given in pharmacy fax report to Dr Georgina Snell [phone] 1 each 0   No current facility-administered medications for this visit.   Allergies  Allergen Reactions  . Cobalt-Mn [Manganese]   . Compazine [Prochlorperazine]   . Corticosteroids   . Eggs Or Egg-Derived Products   . Penicillins      Exam:  BP 144/78 mmHg  Pulse 58  Wt 184 lb (83.462 kg) Gen: Well NAD HEENT: EOMI,  MMM Lungs: Normal work of breathing. CTABL Heart: RRR no MRG Abd: NABS, Soft. Nondistended, Nontender Exts: Brisk capillary  refill, warm and well perfused.  Right foot tarsometatarsal bossing is present. Not particularly tender. Pulses capillary refill sensation and motion are intact of the foot and ankle.  X-ray right foot and ankle showed diffuse DJD. Awaiting formal radiology review.   No results found for this or any previous visit (from the past 24 hour(s)). No results found.    Assessment and Plan: 70 y.o. female with  1) HTN: Sent Lisinopril to mail order pharmacy.  Check labs 1 week after starting lisinopril.  2) Foot  shows significant DJD. Discussed options. Plan for watchful waiting.  3) Give Prevnar pneumonia vaccine prior to D/C 4) Zoster vaccine prescribed.   Discussed warning signs or symptoms. Please see discharge instructions. Patient expresses understanding.

## 2016-01-04 NOTE — Patient Instructions (Addendum)
Thank you for coming in today. Pick up the shingles vaccine from the [facility].  Start taking Lisinopril blood pressure medicine from [facility].  After you have been taking Lisinopril for about a week come back for labs and a nurse visit to recheck blood pressure and potassium level.   Foot: Let me know how you are doing. We will do a MRI if it worsens.

## 2016-01-05 NOTE — Progress Notes (Signed)
Quick Note:  Foot xray shows a possible old break in the outside part of the foot that has not healed. I dont think this is the cause of pain because you hurt on the inside part of the foot. CT or MRI of the foot will tell us more information. ______

## 2016-01-05 NOTE — Progress Notes (Signed)
Quick Note:  Ankle xray shows a lot of arthritis ______

## 2016-01-12 ENCOUNTER — Telehealth: Payer: Self-pay | Admitting: Family Medicine

## 2016-01-12 DIAGNOSIS — M79671 Pain in right foot: Secondary | ICD-10-CM

## 2016-01-12 NOTE — Telephone Encounter (Signed)
Patient would like to get an Mri for her foot that is still bothering her. Thanks

## 2016-01-15 ENCOUNTER — Ambulatory Visit (INDEPENDENT_AMBULATORY_CARE_PROVIDER_SITE_OTHER): Payer: Medicare Other

## 2016-01-15 DIAGNOSIS — M19071 Primary osteoarthritis, right ankle and foot: Secondary | ICD-10-CM | POA: Diagnosis not present

## 2016-01-15 DIAGNOSIS — M79671 Pain in right foot: Secondary | ICD-10-CM | POA: Diagnosis not present

## 2016-01-15 NOTE — Telephone Encounter (Signed)
MRI ordered. MRIs can be lost her phobic. Does the patient have a problem with claustrophobia?

## 2016-01-16 NOTE — Telephone Encounter (Signed)
Terance Hart, CMA on 01/16/2016 at 10:37 AM Left message on voicmail

## 2016-01-16 NOTE — Telephone Encounter (Signed)
Quick Note:  There is quite a lot of arthritis in the midfoot. This is probably causing the pain. I think we should do an injection. Return to clinic for further discussion and possible injection. ______

## 2016-01-19 ENCOUNTER — Encounter: Payer: Self-pay | Admitting: Family Medicine

## 2016-01-19 ENCOUNTER — Ambulatory Visit (INDEPENDENT_AMBULATORY_CARE_PROVIDER_SITE_OTHER): Payer: Medicare Other | Admitting: Family Medicine

## 2016-01-19 VITALS — BP 150/81 | HR 67 | Wt 182.0 lb

## 2016-01-19 DIAGNOSIS — M79671 Pain in right foot: Secondary | ICD-10-CM

## 2016-01-19 DIAGNOSIS — M8580 Other specified disorders of bone density and structure, unspecified site: Secondary | ICD-10-CM | POA: Diagnosis not present

## 2016-01-19 DIAGNOSIS — I10 Essential (primary) hypertension: Secondary | ICD-10-CM | POA: Diagnosis not present

## 2016-01-19 DIAGNOSIS — E559 Vitamin D deficiency, unspecified: Secondary | ICD-10-CM | POA: Diagnosis not present

## 2016-01-19 MED ORDER — DICLOFENAC SODIUM 1 % TD GEL: 2.0000 g | Freq: Four times a day (QID) | TRANSDERMAL | Status: DC

## 2016-01-19 NOTE — Progress Notes (Signed)
Kimberly Jackson is a 70 y.o. female who presents to [facility]: [facility] today for follow-up foot pain. Patient recently had an MRI of her foot revealing severe DJD of the midfoot. She notes continued pain that is interfering with her ability to walk normally.   Past Medical History  Diagnosis Date  . Depression   . Alcohol abuse   . COPD (chronic obstructive pulmonary disease) ([place])   . Reflux    Past Surgical History  Procedure Laterality Date  . Abdominal hysterectomy    . Appendectomy      70 yo  . Tonsillectomy    . Cholecystectomy    . Rotator cuff repair    . Replacement total knee    . Bladder surgery  2015   Social History  Substance Use Topics  . Smoking status: Never Smoker   . Smokeless tobacco: Not on file  . Alcohol Use: No   family history includes Alcohol abuse in her mother.  ROS as above:  Medications: Current Outpatient Prescriptions  Medication Sig Dispense Refill  . Cholecalciferol (VITAMIN D3) 5000 units CAPS Take 5,000 Units by mouth daily.    . digoxin (LANOXIN) 0.125 MG tablet Take 1 tablet (0.125 mg total) by mouth daily. 90 tablet 0  . esomeprazole (NEXIUM) 40 MG capsule Take 1 capsule (40 mg total) by mouth daily at 12 noon. 30 capsule 1  . fluticasone (FLONASE) 50 MCG/ACT nasal spray SPRAY 1 SPRAY IN EACH NOSTRILS BID  0  . fluticasone-salmeterol (ADVAIR HFA) 115-21 MCG/ACT inhaler Inhale 2 puffs into the lungs 2 (two) times daily. 1 Inhaler 6  . Ipratropium-Albuterol (COMBIVENT) 20-100 MCG/ACT AERS respimat Inhale 1 puff into the lungs every 6 (six) hours as needed for wheezing or shortness of breath. 1 Inhaler 5  . ipratropium-albuterol (DUONEB) 0.5-2.5 (3) MG/3ML SOLN Take 3 mLs by nebulization every 6 (six) hours as needed. 360 mL 1  . lisinopril (PRINIVIL,ZESTRIL) 10 MG tablet Take 1 tablet (10 mg total) by mouth daily. 90  tablet 0  . metoprolol succinate (TOPROL-XL) 100 MG 24 hr tablet Take 1 tablet (100 mg total) by mouth daily. Take with or immediately following a meal. 30 tablet 1  . triamterene-hydrochlorothiazide (MAXZIDE-25) 37.5-25 MG tablet Take 1 tablet by mouth daily. 30 tablet 1  . Zoster Vaccine Live, PF, (ZOSTAVAX) 60454 UNT/0.65ML injection Inject 19,400 Units into the skin once. If given in pharmacy fax report to Dr Georgina Snell [phone] 1 each 0  . diclofenac sodium (VOLTAREN) 1 % GEL Apply 2 g topically 4 (four) times daily. To affected joint. 100 g 11   No current facility-administered medications for this visit.   Allergies  Allergen Reactions  . Cobalt-Mn [Manganese]   . Compazine [Prochlorperazine]   . Corticosteroids   . Eggs Or Egg-Derived Products   . Penicillins      Exam:  BP 150/81 mmHg  Pulse 67  Wt 182 lb (82.555 kg) Gen: Well NAD Right foot bossing of the tarsal metatarsal joint with some swallowing overlying the second tarsometatarsal joint. Mildly tender to palpation at the second TMT. Nontender otherwise. Normal foot motion pulses capillary refill and sensation.  CLINICAL DATA: Right foot pain getting worse. Pain along the dorsal aspect.  EXAM: MRI OF THE RIGHT FOREFOOT WITHOUT CONTRAST  TECHNIQUE: Multiplanar, multisequence MR imaging was performed. No intravenous contrast was administered.  COMPARISON: None.  FINDINGS: Bones/Joint/Cartilage  Mild osteoarthritis of the first TMT joint. Severe  osteoarthritis of the second TMT joint. Moderate osteoarthritis of the third and fourth TMT joints. Mild osteoarthritis of the fifth MTP joint. Moderate osteoarthritis of the talonavicular joint with subchondral reactive marrow changes.  No acute fracture or dislocation. Normal alignment. No joint effusion.  Collaterals  Collateral ligaments are intact.  Tendons Flexor and extensor compartment tendons are intact.  Muscles  Normal.  Soft  tissue No fluid collection or hematoma. No soft tissue mass.  IMPRESSION: 1. Advanced osteoarthritis at the right midfoot as described above.   Electronically Signed  By: Kathreen Devoid  On: 01/15/2016 16:01  No results found for this or any previous visit (from the past 24 hour(s)). No results found.    Assessment and Plan: 70 y.o. female with foot pain very likely due to DJD. Discussed options. Patient is very wary of steroids as she became suicidal while on a oral steroid Dosepak.  Plan for Cam Walker boot and diclofenac gel. Recheck in a few weeks.  Discussed warning signs or symptoms. Please see discharge instructions. Patient expresses understanding.

## 2016-01-19 NOTE — Patient Instructions (Signed)
Thank you for coming in today. Use the boot Apply the cream.  Get your labs.  Return in 2-4 weeks for recheck.   Do not drive in the boot.

## 2016-01-20 LAB — BASIC METABOLIC PANEL
BUN: 23 mg/dL (ref 7–25)
CALCIUM: 9.1 mg/dL (ref 8.6–10.4)
CO2: 24 mmol/L (ref 20–31)
CREATININE: 0.74 mg/dL (ref 0.60–0.93)
Chloride: 101 mmol/L (ref 98–110)
GLUCOSE: 89 mg/dL (ref 65–99)
POTASSIUM: 4 mmol/L (ref 3.5–5.3)
Sodium: 138 mmol/L (ref 135–146)

## 2016-01-22 LAB — VITAMIN D 25 HYDROXY (VIT D DEFICIENCY, FRACTURES): VIT D 25 HYDROXY: 26 ng/mL — AB (ref 30–100)

## 2016-01-30 NOTE — Progress Notes (Signed)
Quick Note:  Labs look ok. Continue the Vit D ______

## 2016-02-05 ENCOUNTER — Other Ambulatory Visit: Payer: Self-pay | Admitting: Family Medicine

## 2016-02-05 MED ORDER — ESOMEPRAZOLE MAGNESIUM 40 MG PO CPDR: 40.0000 mg | DELAYED_RELEASE_CAPSULE | Freq: Every day | ORAL | Status: DC

## 2016-02-05 MED ORDER — METOPROLOL SUCCINATE ER 100 MG PO TB24: 100.0000 mg | ORAL_TABLET | Freq: Every day | ORAL | Status: DC

## 2016-02-05 NOTE — Telephone Encounter (Signed)
Rx changed to 90 day supply and sent to mail order pharmacy per pt request. Rx for Nexium sent to mail order pharmacy as well.

## 2016-02-06 ENCOUNTER — Encounter: Payer: Self-pay | Admitting: Family Medicine

## 2016-02-06 ENCOUNTER — Ambulatory Visit (INDEPENDENT_AMBULATORY_CARE_PROVIDER_SITE_OTHER): Payer: Medicare Other | Admitting: Family Medicine

## 2016-02-06 VITALS — BP 147/80 | HR 76 | Wt 182.0 lb

## 2016-02-06 DIAGNOSIS — R413 Other amnesia: Secondary | ICD-10-CM | POA: Diagnosis not present

## 2016-02-06 DIAGNOSIS — M79671 Pain in right foot: Secondary | ICD-10-CM | POA: Diagnosis not present

## 2016-02-06 DIAGNOSIS — I10 Essential (primary) hypertension: Secondary | ICD-10-CM

## 2016-02-06 NOTE — Progress Notes (Signed)
Kimberly Jackson is a 70 y.o. female who presents to [facility]: [place] today for follow-up foot pain, hypertension, and discuss memory disturbances.  1) foot pain: Patient notes her foot pain significantly improved while medication. She used a hot tub and in warm water soaks which helped a lot. She has not yet started her Voltaren gel. She is reluctant to consider injections because she had a history of suicidality with previous steroids in the past. She uses the cam walker boot intermittently which helps.  2) hypertension: Patient notes typically her blood pressure at home is well controlled with a systolic blood pressure in the 120s to 130s. She denies any chest pains palpitations or shortness of breath. She notes that her blood pressures elevated today because she has run out of her metoprolol.  3) memory: Patient notes decreasing memory and some word finding difficulties occurring over the last several months. Her husband has not noticed any changes in her behavior.   Past Medical History  Diagnosis Date  . Depression   . Alcohol abuse   . COPD (chronic obstructive pulmonary disease) ([place])   . Reflux    Past Surgical History  Procedure Laterality Date  . Abdominal hysterectomy    . Appendectomy      70 yo  . Tonsillectomy    . Cholecystectomy    . Rotator cuff repair    . Replacement total knee    . Bladder surgery  2015   Social History  Substance Use Topics  . Smoking status: Never Smoker   . Smokeless tobacco: Not on file  . Alcohol Use: No   family history includes Alcohol abuse in her mother.  ROS as above:  Medications: Current Outpatient Prescriptions  Medication Sig Dispense Refill  . Cholecalciferol (VITAMIN D3) 5000 units CAPS Take 5,000 Units by mouth daily.    . diclofenac sodium (VOLTAREN) 1 % GEL Apply 2 g topically 4 (four) times daily. To  affected joint. 100 g 11  . digoxin (LANOXIN) 0.125 MG tablet Take 1 tablet (0.125 mg total) by mouth daily. 90 tablet 0  . esomeprazole (NEXIUM) 40 MG capsule Take 1 capsule (40 mg total) by mouth daily at 12 noon. 90 capsule 1  . fluticasone (FLONASE) 50 MCG/ACT nasal spray SPRAY 1 SPRAY IN EACH NOSTRILS BID  0  . fluticasone-salmeterol (ADVAIR HFA) 115-21 MCG/ACT inhaler Inhale 2 puffs into the lungs 2 (two) times daily. 1 Inhaler 6  . Ipratropium-Albuterol (COMBIVENT) 20-100 MCG/ACT AERS respimat Inhale 1 puff into the lungs every 6 (six) hours as needed for wheezing or shortness of breath. 1 Inhaler 5  . ipratropium-albuterol (DUONEB) 0.5-2.5 (3) MG/3ML SOLN Take 3 mLs by nebulization every 6 (six) hours as needed. 360 mL 1  . lisinopril (PRINIVIL,ZESTRIL) 10 MG tablet Take 1 tablet (10 mg total) by mouth daily. 90 tablet 0  . metoprolol succinate (TOPROL-XL) 100 MG 24 hr tablet Take 1 tablet (100 mg total) by mouth daily. Take with or immediately following a meal. 90 tablet 1  . triamterene-hydrochlorothiazide (MAXZIDE-25) 37.5-25 MG tablet Take 1 tablet by mouth daily. 30 tablet 1  . Zoster Vaccine Live, PF, (ZOSTAVAX) 16109 UNT/0.65ML injection Inject 19,400 Units into the skin once. If given in pharmacy fax report to Dr Georgina Snell [phone] 1 each 0   No current facility-administered medications for this visit.   Allergies  Allergen Reactions  . Cobalt-Mn [Manganese]   . Compazine [Prochlorperazine]   .  Corticosteroids   . Eggs Or Egg-Derived Products   . Penicillins      Exam:  BP 147/80 mmHg  Pulse 76  Wt 182 lb (82.555 kg) Gen: Well NAD HEENT: EOMI,  MMM Lungs: Normal work of breathing. CTABL Heart: RRR no MRG Abd: NABS, Soft. Nondistended, Nontender Exts: Brisk capillary refill, warm and well perfused.  Right foot: Slight tarsal metatarsal bossing. Nontender. Pulses capillary refill and sensation intact. Neuro psych: Alert and oriented normal speech thought process and  affect.  Mini-Mental Status exam is normal with 30/30 see scanned document  No results found for this or any previous visit (from the past 24 hour(s)). No results found.    Assessment and Plan: 70 y.o. female with  1) foot pain: Improved. Continue diclofenac gel. Use warm water soaks as needed. Recheck in a few months. 2) hypertension: Well controlled at home. Refill metoprolol. Recheck in a few months. 3) memory: Normal Mini-Mental Status exam. Plan for watchful waiting.  Discussed warning signs or symptoms. Please see discharge instructions. Patient expresses understanding.

## 2016-02-06 NOTE — Patient Instructions (Addendum)
Thank you for coming in today. Return in September as directed.  Use the voltaren gel.  Use warm water soaks.  Return sooner if needed.

## 2016-02-08 ENCOUNTER — Other Ambulatory Visit: Payer: Self-pay

## 2016-02-08 MED ORDER — OMEPRAZOLE 40 MG PO CPDR: 40.0000 mg | DELAYED_RELEASE_CAPSULE | Freq: Every day | ORAL | Status: DC

## 2016-02-19 ENCOUNTER — Telehealth: Payer: Self-pay

## 2016-02-19 ENCOUNTER — Other Ambulatory Visit: Payer: Self-pay

## 2016-02-19 MED ORDER — TRIAMTERENE-HCTZ 37.5-25 MG PO TABS: 1.0000 | ORAL_TABLET | Freq: Every day | ORAL | 1 refills | Status: DC

## 2016-02-19 MED ORDER — AMBULATORY NON FORMULARY MEDICATION: 0 refills | Status: AC

## 2016-02-19 NOTE — Telephone Encounter (Signed)
Pt called requesting a rx for a blood pressure monitor stating that the one she has been using no longer works. Advised pt that insurance may not cover that and that we could write an rx. Pt coming in tomorrow for nurse visit and would like to pick rx up at that time. Please advise.

## 2016-02-19 NOTE — Telephone Encounter (Signed)
Blood pressure monitor prescription provided

## 2016-02-20 ENCOUNTER — Ambulatory Visit: Payer: Medicare Other

## 2016-02-20 NOTE — Telephone Encounter (Signed)
Rx placed up front for pickup. Pt is coming in for a nurse visit.

## 2016-04-05 ENCOUNTER — Encounter: Payer: Self-pay | Admitting: Family Medicine

## 2016-04-05 ENCOUNTER — Ambulatory Visit (INDEPENDENT_AMBULATORY_CARE_PROVIDER_SITE_OTHER): Payer: Medicare Other | Admitting: Family Medicine

## 2016-04-05 VITALS — BP 143/68 | HR 57 | Wt 185.0 lb

## 2016-04-05 DIAGNOSIS — M545 Low back pain, unspecified: Secondary | ICD-10-CM

## 2016-04-05 DIAGNOSIS — M25512 Pain in left shoulder: Secondary | ICD-10-CM | POA: Diagnosis not present

## 2016-04-05 DIAGNOSIS — I10 Essential (primary) hypertension: Secondary | ICD-10-CM | POA: Diagnosis not present

## 2016-04-05 DIAGNOSIS — H938X1 Other specified disorders of right ear: Secondary | ICD-10-CM | POA: Diagnosis not present

## 2016-04-05 NOTE — Progress Notes (Signed)
Kimberly Jackson is a 70 y.o. female who presents to [facility] today for  Follow-up hypertension: Doing reasonably well with the below medications. No chest pains palpitations shortness of breath.  Right ear pressure: Ongoing for about a week. Patient notes decreased hearing sensation and pressure. She is concerned that she has a cerumen impaction.  Shoulder pain: Present for about a week or 2 with no injury. Pain is worse with reaching back and at night. Pain is located in the left lateral upper arm. No radiating pain weakness or numbness.  Left low back pain: Present for about a week with no injury. Pain is worse activity better with rest. No radiating pain weakness or numbness.  She has not tried any medications or treatment yet.    Past Medical History:  Diagnosis Date  . Alcohol abuse   . COPD (chronic obstructive pulmonary disease) ([place])   . Depression   . Reflux    Past Surgical History:  Procedure Laterality Date  . ABDOMINAL HYSTERECTOMY    . APPENDECTOMY     70 yo  . BLADDER SURGERY  2015  . CHOLECYSTECTOMY    . REPLACEMENT TOTAL KNEE    . ROTATOR CUFF REPAIR    . TONSILLECTOMY     Social History  Substance Use Topics  . Smoking status: Never Smoker  . Smokeless tobacco: Not on file  . Alcohol use No   family history includes Alcohol abuse in her mother.  ROS as above:  Medications: Current Outpatient Prescriptions  Medication Sig Dispense Refill  . AMBULATORY NON FORMULARY MEDICATION Blood pressure monitor use daily for hypertension. 1 Device 0  . Cholecalciferol (VITAMIN D3) 5000 units CAPS Take 5,000 Units by mouth daily.    . diclofenac sodium (VOLTAREN) 1 % GEL Apply 2 g topically 4 (four) times daily. To affected joint. 100 g 11  . digoxin (LANOXIN) 0.125 MG tablet Take 1 tablet (0.125 mg total) by mouth daily. 90 tablet 0  .  fluticasone (FLONASE) 50 MCG/ACT nasal spray SPRAY 1 SPRAY IN EACH NOSTRILS BID  0  . fluticasone-salmeterol (ADVAIR HFA) 115-21 MCG/ACT inhaler Inhale 2 puffs into the lungs 2 (two) times daily. 1 Inhaler 6  . Ipratropium-Albuterol (COMBIVENT) 20-100 MCG/ACT AERS respimat Inhale 1 puff into the lungs every 6 (six) hours as needed for wheezing or shortness of breath. 1 Inhaler 5  . ipratropium-albuterol (DUONEB) 0.5-2.5 (3) MG/3ML SOLN Take 3 mLs by nebulization every 6 (six) hours as needed. 360 mL 1  . lisinopril (PRINIVIL,ZESTRIL) 10 MG tablet Take 1 tablet (10 mg total) by mouth daily. 90 tablet 0  . metoprolol succinate (TOPROL-XL) 100 MG 24 hr tablet Take 1 tablet (100 mg total) by mouth daily. Take with or immediately following a meal. 90 tablet 1  . omeprazole (PRILOSEC) 40 MG capsule Take 1 capsule (40 mg total) by mouth daily. 90 capsule 3  . triamterene-hydrochlorothiazide (MAXZIDE-25) 37.5-25 MG tablet Take 1 tablet by mouth daily. 90 tablet 1  . Zoster Vaccine Live, PF, (ZOSTAVAX) 91478 UNT/0.65ML injection Inject 19,400 Units into the skin once. If given in pharmacy fax report to Dr Georgina Snell [phone] 1 each 0   No current facility-administered medications for this visit.    Allergies  Allergen Reactions  . Cobalt-Mn [Manganese]   . Compazine [Prochlorperazine]   . Corticosteroids   . Eggs Or Egg-Derived Products   . Penicillins      Exam:  BP [name])  143/68   Pulse (!) 57   Wt 185 lb (83.9 kg)  Gen: Well NAD HEENT: EOMI,  MMM Ears are clear of cerumen obstruction bilaterally. Right genetic membrane is slightly retracted compared to left. No erythema.  Lungs: Normal work of breathing. CTABL Heart: RRR no MRG Abd: NABS, Soft. Nondistended, Nontender Exts: Brisk capillary refill, warm and well perfused.  Left shoulder: Normal motion pain with internal rotation and abduction. Positive Hawkins and Neer's test. Negative years speeds test. Positive empty can test. Strength is  intact. Pulses capillary refill sensation are intact. Lumbar spine: Nontender to midline. Tender palpation left SI joint. Normal lower extremity motion strength and reflexes. Normal gait.   No results found for this or any previous visit (from the past 24 hour(s)). No results found.    Assessment and Plan: 70 y.o. female with  Hypertension: Doing reasonable well. Blood pressure slightly elevated. Recheck in about 3 months and continue current regimen.  Right ear pressure: Slightly retracted eardrum. Plan for watchful waiting.  Left shoulder pain: Likely bursitis. Patient does not think her pain is bad enough for formal physical therapy or injection today. Plan for home exercise program  Low back pain: Lumbosacral strain versus degenerative disc disease flare. Plan for home exercise program heating pad and TENS unit.   No orders of the defined types were placed in this encounter.   Discussed warning signs or symptoms. Please see discharge instructions. Patient expresses understanding.

## 2016-04-05 NOTE — Patient Instructions (Signed)
Thank you for coming in today. Do the exercises we discussed.  Return in 3 months.  Use a heating pad.  Use a TENS unit.   TENS UNIT: This is helpful for muscle pain and spasm.   Search and Purchase a TENS 7000 2nd edition at www.tenspros.[email or web address]. It should be less than $30.     TENS unit instructions: Do not shower or bathe with the unit on Turn the unit off before removing electrodes or batteries If the electrodes lose stickiness add a drop of water to the electrodes after they are disconnected from the unit and place on plastic sheet. If you continued to have difficulty, call the TENS unit company to purchase more electrodes. Do not apply lotion on the skin area prior to use. Make sure the skin is clean and dry as this will help prolong the life of the electrodes. After use, always check skin for unusual red areas, rash or other skin difficulties. If there are any skin problems, does not apply electrodes to the same area. Never remove the electrodes from the unit by pulling the wires. Do not use the TENS unit or electrodes other than as directed. Do not change electrode placement without consultating your therapist or physician. Keep 2 fingers with between each electrode. Wear time ratio is 2:1, on to off times.    For example on for 30 minutes off for 15 minutes and then on for 30 minutes off for 15 minutes    Impingement Syndrome, Rotator Cuff, Bursitis With Rehab Impingement syndrome is a condition that involves inflammation of the tendons of the rotator cuff and the subacromial bursa, that causes pain in the shoulder. The rotator cuff consists of four tendons and muscles that control much of the shoulder and upper arm function. The subacromial bursa is a fluid filled sac that helps reduce friction between the rotator cuff and one of the bones of the shoulder (acromion). Impingement syndrome is usually an overuse injury that causes swelling of the bursa (bursitis), swelling of the  tendon (tendonitis), and/or a tear of the tendon (strain). Strains are classified into three categories. Grade 1 strains cause pain, but the tendon is not lengthened. Grade 2 strains include a lengthened ligament, due to the ligament being stretched or partially ruptured. With grade 2 strains there is still function, although the function may be decreased. Grade 3 strains include a complete tear of the tendon or muscle, and function is usually impaired. SYMPTOMS   Pain around the shoulder, often at the outer portion of the upper arm.  Pain that gets worse with shoulder function, especially when reaching overhead or lifting.  Sometimes, aching when not using the arm.  Pain that wakes you up at night.  Sometimes, tenderness, swelling, warmth, or redness over the affected area.  Loss of strength.  Limited motion of the shoulder, especially reaching behind the back (to the back pocket or to unhook bra) or across your body.  Crackling sound (crepitation) when moving the arm.  Biceps tendon pain and inflammation (in the front of the shoulder). Worse when bending the elbow or lifting. CAUSES  Impingement syndrome is often an overuse injury, in which chronic (repetitive) motions cause the tendons or bursa to become inflamed. A strain occurs when a force is paced on the tendon or muscle that is greater than it can withstand. Common mechanisms of injury include: Stress from sudden increase in duration, frequency, or intensity of training.  Direct hit (trauma) to the shoulder.  Aging, erosion  of the tendon with normal use.  Bony bump on shoulder (acromial spur). RISK INCREASES WITH:  Contact sports (football, wrestling, boxing).  Throwing sports (baseball, tennis, volleyball).  Weightlifting and bodybuilding.  Heavy labor.  Previous injury to the rotator cuff, including impingement.  Poor shoulder strength and flexibility.  Failure to warm up properly before activity.  Inadequate  protective equipment.  Old age.  Bony bump on shoulder (acromial spur). PREVENTION   Warm up and stretch properly before activity.  Allow for adequate recovery between workouts.  Maintain physical fitness:  Strength, flexibility, and endurance.  Cardiovascular fitness.  Learn and use proper exercise technique. PROGNOSIS  If treated properly, impingement syndrome usually goes away within 6 weeks. Sometimes surgery is required.  RELATED COMPLICATIONS   Longer healing time if not properly treated, or if not given enough time to heal.  Recurring symptoms, that result in a chronic condition.  Shoulder stiffness, frozen shoulder, or loss of motion.  Rotator cuff tendon tear.  Recurring symptoms, especially if activity is resumed too soon, with overuse, with a direct blow, or when using poor technique. TREATMENT  Treatment first involves the use of ice and medicine, to reduce pain and inflammation. The use of strengthening and stretching exercises may help reduce pain with activity. These exercises may be performed at home or with a therapist. If non-surgical treatment is unsuccessful after more than 6 months, surgery may be advised. After surgery and rehabilitation, activity is usually possible in 3 months.  MEDICATION  If pain medicine is needed, nonsteroidal anti-inflammatory medicines (aspirin and ibuprofen), or other minor pain relievers (acetaminophen), are often advised.  Do not take pain medicine for 7 days before surgery.  Prescription pain relievers may be given, if your caregiver thinks they are needed. Use only as directed and only as much as you need.  Corticosteroid injections may be given by your caregiver. These injections should be reserved for the most serious cases, because they may only be given a certain number of times. HEAT AND COLD  Cold treatment (icing) should be applied for 10 to 15 minutes every 2 to 3 hours for inflammation and pain, and immediately  after activity that aggravates your symptoms. Use ice packs or an ice massage.  Heat treatment may be used before performing stretching and strengthening activities prescribed by your caregiver, physical therapist, or athletic trainer. Use a heat pack or a warm water soak. SEEK MEDICAL CARE IF:   Symptoms get worse or do not improve in 4 to 6 weeks, despite treatment.  New, unexplained symptoms develop. (Drugs used in treatment may produce side effects.) EXERCISES  RANGE OF MOTION (ROM) AND STRETCHING EXERCISES - Impingement Syndrome (Rotator Cuff  Tendinitis, Bursitis) These exercises may help you when beginning to rehabilitate your injury. Your symptoms may go away with or without further involvement from your physician, physical therapist or athletic trainer. While completing these exercises, remember:   Restoring tissue flexibility helps normal motion to return to the joints. This allows healthier, less painful movement and activity.  An effective stretch should be held for at least 30 seconds.  A stretch should never be painful. You should only feel a gentle lengthening or release in the stretched tissue. STRETCH - Flexion, Standing  Stand with good posture. With an underhand grip on your right / left hand, and an overhand grip on the opposite hand, grasp a broomstick or cane so that your hands are a little more than shoulder width apart.  Keeping your right /  left elbow straight and shoulder muscles relaxed, push the stick with your opposite hand, to raise your right / left arm in front of your body and then overhead. Raise your arm until you feel a stretch in your right / left shoulder, but before you have increased shoulder pain.  Try to avoid shrugging your right / left shoulder as your arm rises, by keeping your shoulder blade tucked down and toward your mid-back spine. Hold for __________ seconds.  Slowly return to the starting position. Repeat __________ times. Complete this  exercise __________ times per day. STRETCH - Abduction, Supine  Lie on your back. With an underhand grip on your right / left hand and an overhand grip on the opposite hand, grasp a broomstick or cane so that your hands are a little more than shoulder width apart.  Keeping your right / left elbow straight and your shoulder muscles relaxed, push the stick with your opposite hand, to raise your right / left arm out to the side of your body and then overhead. Raise your arm until you feel a stretch in your right / left shoulder, but before you have increased shoulder pain.  Try to avoid shrugging your right / left shoulder as your arm rises, by keeping your shoulder blade tucked down and toward your mid-back spine. Hold for __________ seconds.  Slowly return to the starting position. Repeat __________ times. Complete this exercise __________ times per day. ROM - Flexion, Active-Assisted  Lie on your back. You may bend your knees for comfort.  Grasp a broomstick or cane so your hands are about shoulder width apart. Your right / left hand should grip the end of the stick, so that your hand is positioned "thumbs-up," as if you were about to shake hands.  Using your healthy arm to lead, raise your right / left arm overhead, until you feel a gentle stretch in your shoulder. Hold for __________ seconds.  Use the stick to assist in returning your right / left arm to its starting position. Repeat __________ times. Complete this exercise __________ times per day.  ROM - Internal Rotation, Supine   Lie on your back on a firm surface. Place your right / left elbow about 60 degrees away from your side. Elevate your elbow with a folded towel, so that the elbow and shoulder are the same height.  Using a broomstick or cane and your strong arm, pull your right / left hand toward your body until you feel a gentle stretch, but no increase in your shoulder pain. Keep your shoulder and elbow in place throughout  the exercise.  Hold for __________ seconds. Slowly return to the starting position. Repeat __________ times. Complete this exercise __________ times per day. STRETCH - Internal Rotation  Place your right / left hand behind your back, palm up.  Throw a towel or belt over your opposite shoulder. Grasp the towel with your right / left hand.  While keeping an upright posture, gently pull up on the towel, until you feel a stretch in the front of your right / left shoulder.  Avoid shrugging your right / left shoulder as your arm rises, by keeping your shoulder blade tucked down and toward your mid-back spine.  Hold for __________ seconds. Release the stretch, by lowering your healthy hand. Repeat __________ times. Complete this exercise __________ times per day. ROM - Internal Rotation   Using an underhand grip, grasp a stick behind your back with both hands.  While standing upright with  good posture, slide the stick up your back until you feel a mild stretch in the front of your shoulder.  Hold for __________ seconds. Slowly return to your starting position. Repeat __________ times. Complete this exercise __________ times per day.  STRETCH - Posterior Shoulder Capsule   Stand or sit with good posture. Grasp your right / left elbow and draw it across your chest, keeping it at the same height as your shoulder.  Pull your elbow, so your upper arm comes in closer to your chest. Pull until you feel a gentle stretch in the back of your shoulder.  Hold for __________ seconds. Repeat __________ times. Complete this exercise __________ times per day. STRENGTHENING EXERCISES - Impingement Syndrome (Rotator Cuff Tendinitis, Bursitis) These exercises may help you when beginning to rehabilitate your injury. They may resolve your symptoms with or without further involvement from your physician, physical therapist or athletic trainer. While completing these exercises, remember:  Muscles can gain both  the endurance and the strength needed for everyday activities through controlled exercises.  Complete these exercises as instructed by your physician, physical therapist or athletic trainer. Increase the resistance and repetitions only as guided.  You may experience muscle soreness or fatigue, but the pain or discomfort you are trying to eliminate should never worsen during these exercises. If this pain does get worse, stop and make sure you are following the directions exactly. If the pain is still present after adjustments, discontinue the exercise until you can discuss the trouble with your clinician.  During your recovery, avoid activity or exercises which involve actions that place your injured hand or elbow above your head or behind your back or head. These positions stress the tissues which you are trying to heal. STRENGTH - Scapular Depression and Adduction   With good posture, sit on a firm chair. Support your arms in front of you, with pillows, arm rests, or on a table top. Have your elbows in line with the sides of your body.  Gently draw your shoulder blades down and toward your mid-back spine. Gradually increase the tension, without tensing the muscles along the top of your shoulders and the back of your neck.  Hold for __________ seconds. Slowly release the tension and relax your muscles completely before starting the next repetition.  After you have practiced this exercise, remove the arm support and complete the exercise in standing as well as sitting position. Repeat __________ times. Complete this exercise __________ times per day.  STRENGTH - Shoulder Abductors, Isometric  With good posture, stand or sit about 4-6 inches from a wall, with your right / left side facing the wall.  Bend your right / left elbow. Gently press your right / left elbow into the wall. Increase the pressure gradually, until you are pressing as hard as you can, without shrugging your shoulder or  increasing any shoulder discomfort.  Hold for __________ seconds.  Release the tension slowly. Relax your shoulder muscles completely before you begin the next repetition. Repeat __________ times. Complete this exercise __________ times per day.  STRENGTH - External Rotators, Isometric  Keep your right / left elbow at your side and bend it 90 degrees.  Step into a door frame so that the outside of your right / left wrist can press against the door frame without your upper arm leaving your side.  Gently press your right / left wrist into the door frame, as if you were trying to swing the back of your hand away from  your stomach. Gradually increase the tension, until you are pressing as hard as you can, without shrugging your shoulder or increasing any shoulder discomfort.  Hold for __________ seconds.  Release the tension slowly. Relax your shoulder muscles completely before you begin the next repetition. Repeat __________ times. Complete this exercise __________ times per day.  STRENGTH - Supraspinatus   Stand or sit with good posture. Grasp a __________ weight, or an exercise band or tubing, so that your hand is "thumbs-up," like you are shaking hands.  Slowly lift your right / left arm in a "V" away from your thigh, diagonally into the space between your side and straight ahead. Lift your hand to shoulder height or as far as you can, without increasing any shoulder pain. At first, many people do not lift their hands above shoulder height.  Avoid shrugging your right / left shoulder as your arm rises, by keeping your shoulder blade tucked down and toward your mid-back spine.  Hold for __________ seconds. Control the descent of your hand, as you slowly return to your starting position. Repeat __________ times. Complete this exercise __________ times per day.  STRENGTH - External Rotators  Secure a rubber exercise band or tubing to a fixed object (table, pole) so that it is at the same  height as your right / left elbow when you are standing or sitting on a firm surface.  Stand or sit so that the secured exercise band is at your uninjured side.  Bend your right / left elbow 90 degrees. Place a folded towel or small pillow under your right / left arm, so that your elbow is a few inches away from your side.  Keeping the tension on the exercise band, pull it away from your body, as if pivoting on your elbow. Be sure to keep your body steady, so that the movement is coming only from your rotating shoulder.  Hold for __________ seconds. Release the tension in a controlled manner, as you return to the starting position. Repeat __________ times. Complete this exercise __________ times per day.  STRENGTH - Internal Rotators   Secure a rubber exercise band or tubing to a fixed object (table, pole) so that it is at the same height as your right / left elbow when you are standing or sitting on a firm surface.  Stand or sit so that the secured exercise band is at your right / left side.  Bend your elbow 90 degrees. Place a folded towel or small pillow under your right / left arm so that your elbow is a few inches away from your side.  Keeping the tension on the exercise band, pull it across your body, toward your stomach. Be sure to keep your body steady, so that the movement is coming only from your rotating shoulder.  Hold for __________ seconds. Release the tension in a controlled manner, as you return to the starting position. Repeat __________ times. Complete this exercise __________ times per day.  STRENGTH - Scapular Protractors, Standing   Stand arms length away from a wall. Place your hands on the wall, keeping your elbows straight.  Begin by dropping your shoulder blades down and toward your mid-back spine.  To strengthen your protractors, keep your shoulder blades down, but slide them forward on your rib cage. It will feel as if you are lifting the back of your rib cage  away from the wall. This is a subtle motion and can be challenging to complete. Ask your caregiver for  further instruction, if you are not sure you are doing the exercise correctly.  Hold for __________ seconds. Slowly return to the starting position, resting the muscles completely before starting the next repetition. Repeat __________ times. Complete this exercise __________ times per day. STRENGTH - Scapular Protractors, Supine  Lie on your back on a firm surface. Extend your right / left arm straight into the air while holding a __________ weight in your hand.  Keeping your head and back in place, lift your shoulder off the floor.  Hold for __________ seconds. Slowly return to the starting position, and allow your muscles to relax completely before starting the next repetition. Repeat __________ times. Complete this exercise __________ times per day. STRENGTH - Scapular Protractors, Quadruped  Get onto your hands and knees, with your shoulders directly over your hands (or as close as you can be, comfortably).  Keeping your elbows locked, lift the back of your rib cage up into your shoulder blades, so your mid-back rounds out. Keep your neck muscles relaxed.  Hold this position for __________ seconds. Slowly return to the starting position and allow your muscles to relax completely before starting the next repetition. Repeat __________ times. Complete this exercise __________ times per day.  STRENGTH - Scapular Retractors  Secure a rubber exercise band or tubing to a fixed object (table, pole), so that it is at the height of your shoulders when you are either standing, or sitting on a firm armless chair.  With a palm down grip, grasp an end of the band in each hand. Straighten your elbows and lift your hands straight in front of you, at shoulder height. Step back, away from the secured end of the band, until it becomes tense.  Squeezing your shoulder blades together, draw your elbows back  toward your sides, as you bend them. Keep your upper arms lifted away from your body throughout the exercise.  Hold for __________ seconds. Slowly ease the tension on the band, as you reverse the directions and return to the starting position. Repeat __________ times. Complete this exercise __________ times per day. STRENGTH - Shoulder Extensors   Secure a rubber exercise band or tubing to a fixed object (table, pole) so that it is at the height of your shoulders when you are either standing, or sitting on a firm armless chair.  With a thumbs-up grip, grasp an end of the band in each hand. Straighten your elbows and lift your hands straight in front of you, at shoulder height. Step back, away from the secured end of the band, until it becomes tense.  Squeezing your shoulder blades together, pull your hands down to the sides of your thighs. Do not allow your hands to go behind you.  Hold for __________ seconds. Slowly ease the tension on the band, as you reverse the directions and return to the starting position. Repeat __________ times. Complete this exercise __________ times per day.  STRENGTH - Scapular Retractors and External Rotators   Secure a rubber exercise band or tubing to a fixed object (table, pole) so that it is at the height as your shoulders, when you are either standing, or sitting on a firm armless chair.  With a palm down grip, grasp an end of the band in each hand. Bend your elbows 90 degrees and lift your elbows to shoulder height, at your sides. Step back, away from the secured end of the band, until it becomes tense.  Squeezing your shoulder blades together, rotate your shoulders so  that your upper arms and elbows remain stationary, but your fists travel upward to head height.  Hold for __________ seconds. Slowly ease the tension on the band, as you reverse the directions and return to the starting position. Repeat __________ times. Complete this exercise __________ times  per day.  STRENGTH - Scapular Retractors and External Rotators, Rowing   Secure a rubber exercise band or tubing to a fixed object (table, pole) so that it is at the height of your shoulders, when you are either standing, or sitting on a firm armless chair.  With a palm down grip, grasp an end of the band in each hand. Straighten your elbows and lift your hands straight in front of you, at shoulder height. Step back, away from the secured end of the band, until it becomes tense.  Step 1: Squeeze your shoulder blades together. Bending your elbows, draw your hands to your chest, as if you are rowing a boat. At the end of this motion, your hands and elbow should be at shoulder height and your elbows should be out to your sides.  Step 2: Rotate your shoulders, to raise your hands above your head. Your forearms should be vertical and your upper arms should be horizontal.  Hold for __________ seconds. Slowly ease the tension on the band, as you reverse the directions and return to the starting position. Repeat __________ times. Complete this exercise __________ times per day.  STRENGTH - Scapular Depressors  Find a sturdy chair without wheels, such as a dining room chair.  Keeping your feet on the floor, and your hands on the chair arms, lift your bottom up from the seat, and lock your elbows.  Keeping your elbows straight, allow gravity to pull your body weight down. Your shoulders will rise toward your ears.  Raise your body against gravity by drawing your shoulder blades down your back, shortening the distance between your shoulders and ears. Although your feet should always maintain contact with the floor, your feet should progressively support less body weight, as you get stronger.  Hold for __________ seconds. In a controlled and slow manner, lower your body weight to begin the next repetition. Repeat __________ times. Complete this exercise __________ times per day.    This information is  not intended to replace advice given to you by your health care provider. Make sure you discuss any questions you have with your health care provider.   Document Released: 07/08/2005 Document Revised: 07/29/2014 Document Reviewed: 10/20/2008 Elsevier Interactive Patient Education [employer].

## 2016-04-07 ENCOUNTER — Other Ambulatory Visit: Payer: Self-pay | Admitting: Family Medicine

## 2016-05-17 ENCOUNTER — Other Ambulatory Visit: Payer: Self-pay | Admitting: Family Medicine

## 2016-05-17 DIAGNOSIS — I10 Essential (primary) hypertension: Secondary | ICD-10-CM

## 2016-06-04 ENCOUNTER — Telehealth: Payer: Self-pay

## 2016-06-04 NOTE — Telephone Encounter (Signed)
Acetaminophen is okay to take. Maximum dose is 1000 mg every 6 hours.

## 2016-06-04 NOTE — Telephone Encounter (Signed)
Pt reports that she has been battling cold Sx for past couple days.  She is already taking coricidin.  She wants to know what other OTC medication do you recommend that want affect her BP. Please advise.

## 2016-06-05 NOTE — Telephone Encounter (Signed)
Pt.notified

## 2016-07-05 ENCOUNTER — Ambulatory Visit (INDEPENDENT_AMBULATORY_CARE_PROVIDER_SITE_OTHER): Payer: Medicare Other | Admitting: Family Medicine

## 2016-07-05 ENCOUNTER — Encounter: Payer: Self-pay | Admitting: Family Medicine

## 2016-07-05 VITALS — BP 124/63 | HR 56 | Wt 181.0 lb

## 2016-07-05 DIAGNOSIS — J410 Simple chronic bronchitis: Secondary | ICD-10-CM | POA: Diagnosis not present

## 2016-07-05 DIAGNOSIS — G4733 Obstructive sleep apnea (adult) (pediatric): Secondary | ICD-10-CM | POA: Diagnosis not present

## 2016-07-05 DIAGNOSIS — I10 Essential (primary) hypertension: Secondary | ICD-10-CM

## 2016-07-05 DIAGNOSIS — I480 Paroxysmal atrial fibrillation: Secondary | ICD-10-CM

## 2016-07-05 DIAGNOSIS — J449 Chronic obstructive pulmonary disease, unspecified: Secondary | ICD-10-CM

## 2016-07-05 MED ORDER — TRIAMTERENE-HCTZ 37.5-25 MG PO TABS: 1.0000 | ORAL_TABLET | Freq: Every day | ORAL | 1 refills | Status: DC

## 2016-07-05 MED ORDER — METOPROLOL SUCCINATE ER 100 MG PO TB24: 100.0000 mg | ORAL_TABLET | Freq: Every day | ORAL | 1 refills | Status: DC

## 2016-07-05 MED ORDER — LISINOPRIL 10 MG PO TABS: 10.0000 mg | ORAL_TABLET | Freq: Every day | ORAL | 1 refills | Status: DC

## 2016-07-05 NOTE — Progress Notes (Signed)
Kimberly Jackson is a 70 y.o. female who presents to [place]: [place] today for follow-up hypertension, COPD, sleep apnea.  Hypertension: Doing well with the below medical regimen. She denies any chest pains palpitations or shortness of breath.  COPD: Patient is currently doing pretty well as COPD with the below regimen. She notes she had an exacerbation a few months ago which resolved spontaneously. She thinks her breathing is doing pretty well.  Sleep apnea: Patient is a history of sleep apnea. She's been prescribed a CPAP machine but finds the mass to be very uncomfortable. She would like a retrial of different types of masks and thinks she could use a repeat order for medical supply company.   Past Medical History:  Diagnosis Date  . Alcohol abuse   . COPD (chronic obstructive pulmonary disease) ([place])   . Depression   . Reflux    Past Surgical History:  Procedure Laterality Date  . ABDOMINAL HYSTERECTOMY    . APPENDECTOMY     70 yo  . BLADDER SURGERY  2015  . CHOLECYSTECTOMY    . REPLACEMENT TOTAL KNEE    . ROTATOR CUFF REPAIR    . TONSILLECTOMY     Social History  Substance Use Topics  . Smoking status: Never Smoker  . Smokeless tobacco: Not on file  . Alcohol use No   family history includes Alcohol abuse in her mother.  ROS as above:  Medications: Current Outpatient Prescriptions  Medication Sig Dispense Refill  . AMBULATORY NON FORMULARY MEDICATION Blood pressure monitor use daily for hypertension. 1 Device 0  . Cholecalciferol (VITAMIN D3) 5000 units CAPS Take 5,000 Units by mouth daily.    . diclofenac sodium (VOLTAREN) 1 % GEL Apply 2 g topically 4 (four) times daily. To affected joint. 100 g 11  . digoxin (LANOXIN) 0.125 MG tablet TAKE 1 TABLET DAILY 90 tablet 0  . fluticasone (FLONASE) 50 MCG/ACT nasal spray SPRAY 1 SPRAY IN EACH NOSTRILS BID   0  . fluticasone-salmeterol (ADVAIR HFA) 115-21 MCG/ACT inhaler Inhale 2 puffs into the lungs 2 (two) times daily. 1 Inhaler 6  . Ipratropium-Albuterol (COMBIVENT) 20-100 MCG/ACT AERS respimat Inhale 1 puff into the lungs every 6 (six) hours as needed for wheezing or shortness of breath. 1 Inhaler 5  . ipratropium-albuterol (DUONEB) 0.5-2.5 (3) MG/3ML SOLN Take 3 mLs by nebulization every 6 (six) hours as needed. 360 mL 1  . lisinopril (PRINIVIL,ZESTRIL) 10 MG tablet Take 1 tablet (10 mg total) by mouth daily. 90 tablet 1  . metoprolol succinate (TOPROL-XL) 100 MG 24 hr tablet Take 1 tablet (100 mg total) by mouth daily. Take with or immediately following a meal. 90 tablet 1  . omeprazole (PRILOSEC) 40 MG capsule Take 1 capsule (40 mg total) by mouth daily. 90 capsule 3  . triamterene-hydrochlorothiazide (MAXZIDE-25) 37.5-25 MG tablet Take 1 tablet by mouth daily. 90 tablet 1  . Zoster Vaccine Live, PF, (ZOSTAVAX) 91478 UNT/0.65ML injection Inject 19,400 Units into the skin once. If given in pharmacy fax report to Dr Georgina Snell [phone] 1 each 0   No current facility-administered medications for this visit.    Allergies  Allergen Reactions  . Cobalt-Mn [Manganese]   . Compazine [Prochlorperazine]   . Corticosteroids   . Eggs Or Egg-Derived Products   . Penicillins     Health Maintenance Health Maintenance  Topic Date Due  . INFLUENZA VACCINE  09/03/2020 (Originally 02/20/2016)  . MAMMOGRAM  02/15/2017  . COLONOSCOPY  07/28/2017  . TETANUS/TDAP  11/19/2024  . DEXA SCAN  Completed  . ZOSTAVAX  Completed  . Hepatitis C Screening  Addressed  . PNA vac Low Risk Adult  Completed     Exam:  BP 124/63   Pulse (!) 56   Wt 181 lb (82.1 kg)   SpO2 100%  Gen: Well NAD HEENT: EOMI,  MMM Lungs: Normal work of breathing. CTABL Heart: RRR no MRG Abd: NABS, Soft. Nondistended, Nontender Exts: Brisk capillary refill, warm and well perfused.    No results found for this or any previous  visit (from the past 72 hour(s)). No results found.    Assessment and Plan: 70 y.o. female with  Hypertension/heart disease/paroxysmal atrial fib: Doing well. Continue current regimen.  COPD: Doing well continue current regimen.  Sleep apnea: We will get records of recent sleep study and reorder CPAP supplies when sleep study records are back.  Follow-up in a few months for a Medicare wellness exam   No orders of the defined types were placed in this encounter.   Discussed warning signs or symptoms. Please see discharge instructions. Patient expresses understanding.

## 2016-07-05 NOTE — Patient Instructions (Addendum)
Thank you for coming in today. We will get sleep study reports back.  Let me know in 1 month if you have not heard anything.   Return for a medicare well exam in the spring.

## 2016-08-28 IMAGING — DX DG FOOT COMPLETE 3+V*R*
3 series · 3 of 3 positions shown · non-contrast
Comparison: None.

CLINICAL DATA: 70-year-old female with a history of medial foot
pain. No injury.

EXAM:
RIGHT FOOT COMPLETE - 3+ VIEW

[foot ap]
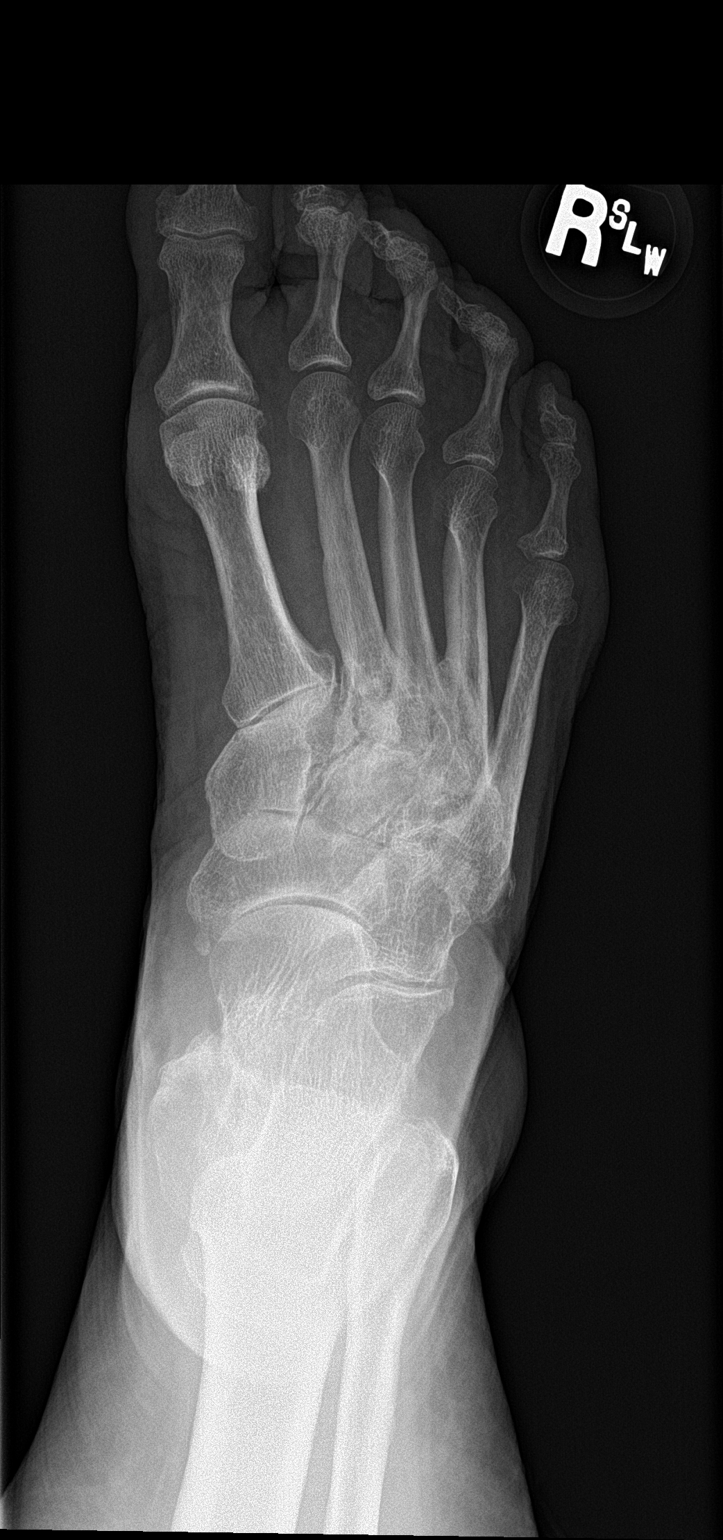

[foot obl]
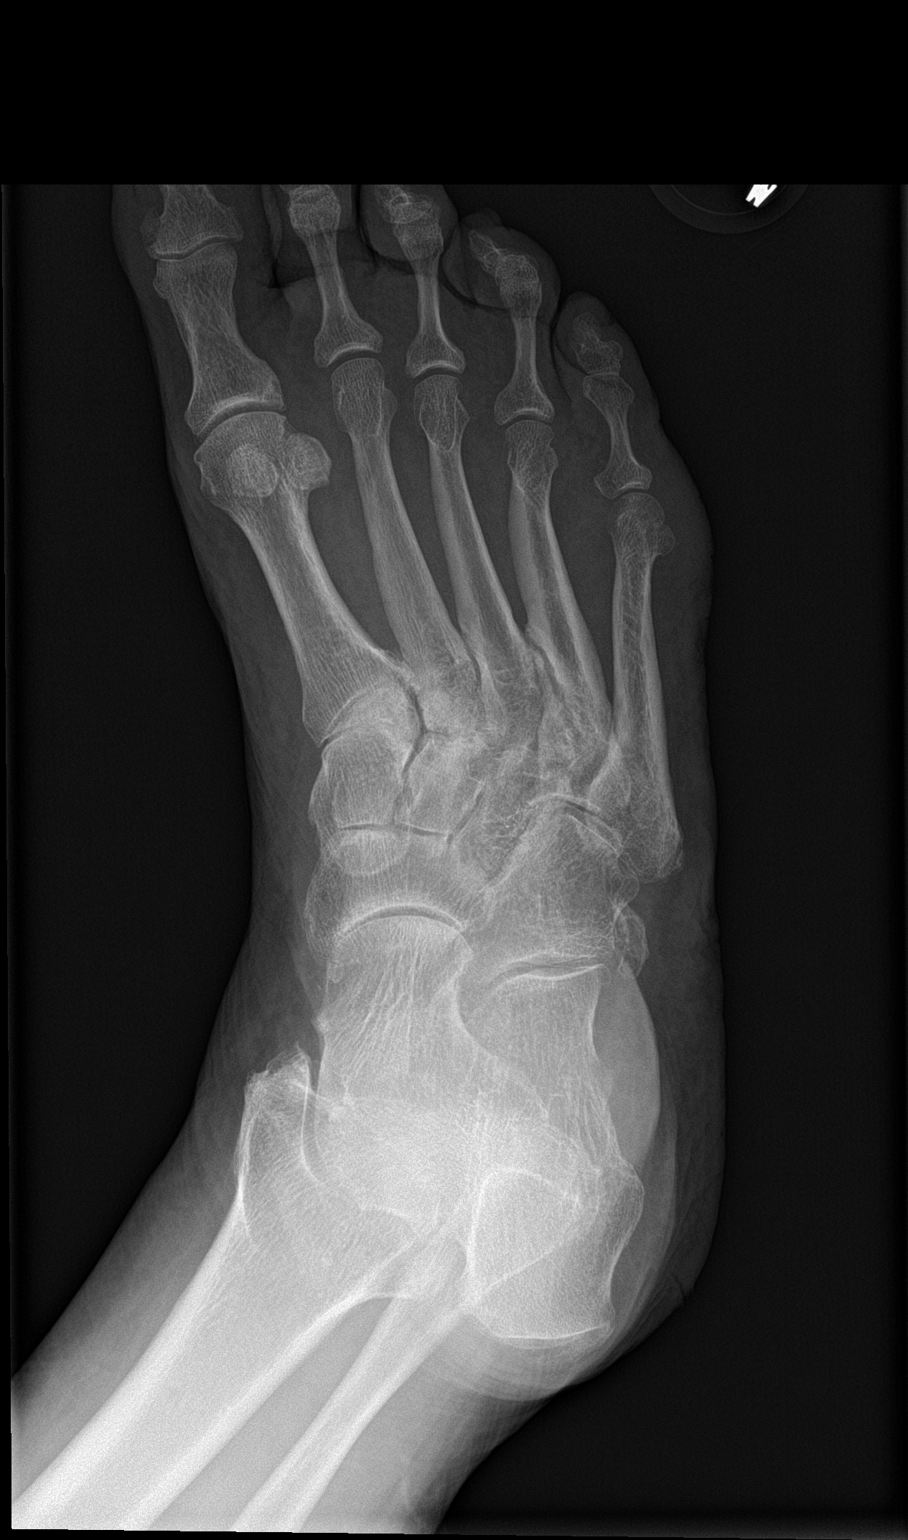

[foot lat]
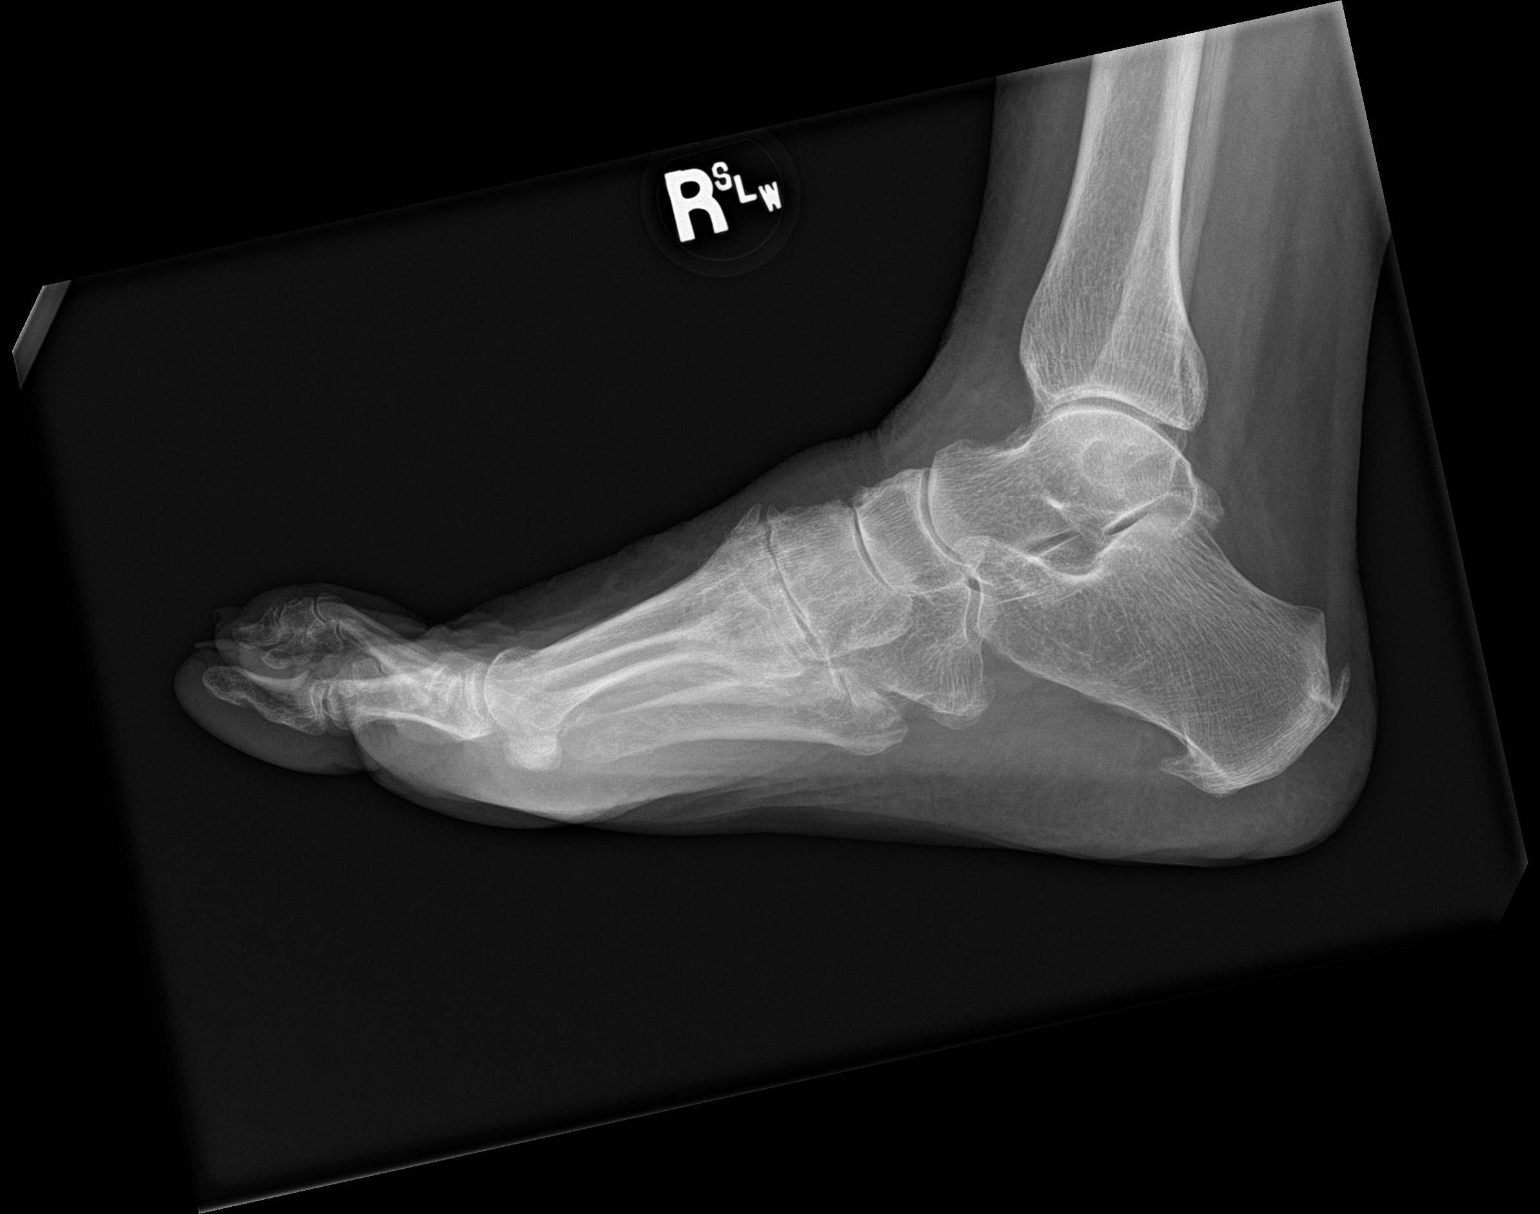

[3 of 3 positions shown; findings below may reference images not displayed]

FINDINGS: No acute fracture identified.

No focal soft tissue swelling.  No radiopaque foreign body.

Degenerative changes at the calcaneus and the midfoot.

There appears to be a chronic/ non healed fracture at the base of
the fourth metatarsal. No comparison available.
IMPRESSION: No acute bony abnormality identified.

There appears to be a chronic/ non healed fracture at the base of
the fourth metatarsal. No comparison study available.

## 2016-09-03 ENCOUNTER — Ambulatory Visit (INDEPENDENT_AMBULATORY_CARE_PROVIDER_SITE_OTHER): Payer: Medicare Other

## 2016-09-03 ENCOUNTER — Encounter: Payer: Self-pay | Admitting: Family Medicine

## 2016-09-03 ENCOUNTER — Ambulatory Visit (INDEPENDENT_AMBULATORY_CARE_PROVIDER_SITE_OTHER): Payer: Medicare Other | Admitting: Family Medicine

## 2016-09-03 VITALS — BP 170/63 | HR 60 | Wt 188.0 lb

## 2016-09-03 DIAGNOSIS — Z23 Encounter for immunization: Secondary | ICD-10-CM | POA: Diagnosis not present

## 2016-09-03 DIAGNOSIS — R0781 Pleurodynia: Secondary | ICD-10-CM

## 2016-09-03 NOTE — Patient Instructions (Signed)
Thank you for coming in today. Use the breathing device while awake.    Chest Contusion, Adult A chest contusion is a deep bruise on the chest. Bruises happen when an injury causes bleeding under the skin. Signs of bruising include pain, puffiness (swelling), and skin that has changed from its normal color. The bruise may turn blue, purple, or yellow. Minor injuries may give you a painless bruise, but worse bruises may stay painful and swollen for a few weeks. Follow these instructions at home:  If directed, put ice on the injured area.  Put ice in a plastic bag.  Place a towel between your skin and the bag.  Leave the ice on for 20 minutes, 2-3 times per day.  Take over-the-counter and prescription medicines only as told by your doctor.  If told by your doctor, do deep-breathing exercises.  Do not lie down flat on your back. Keep your head and chest raised (elevated) when you rest or sleep.  Do not use any products that contain nicotine or tobacco, such as cigarettes and e-cigarettes. If you need help quitting, ask your doctor.  Do not lift anything that causes pain. Contact a doctor if:  Medicines or treatment do not help your swelling or pain.  You have more bruising.  You have more swelling.  Your pain gets worse  Your symptoms do not get better in one week. Get help right away if:  You suddenly have a lot more pain.  You have trouble breathing.  You feel dizzy or weak.  You pass out (faint).  You have blood in your pee (urine) or poop (stool).  You cough up blood or you throw up (vomit) blood. Summary  A chest contusion is a deep bruise on the chest. Bruises happen when an injury causes bleeding under the skin.  Treatment may include resting and putting ice on the injured area.  Contact a doctor if you have trouble breathing or if your pain does not get better with treatment. This information is not intended to replace advice given to you by your health  care provider. Make sure you discuss any questions you have with your health care provider. Document Released: 12/25/2007 Document Revised: 04/04/2016 Document Reviewed: 04/04/2016 Elsevier Interactive Patient Education  2017 [employer].

## 2016-09-03 NOTE — Progress Notes (Signed)
Kimberly Jackson is a 71 y.o. female who presents to [place]: [place] today for rib pain. Patient has a 1-1/2 week history of right lateral cheek inferior rib pain. Pain occurred when she leaned over to the side and pushed her chest wall against the padded arm of a chair. She's had continued pain especially worse with deep inspiration cough and sneezing. She denies shortness of breath fevers or chills however. She has tried some over-the-counter medications which have only been mildly helpful.   Past Medical History:  Diagnosis Date  . Alcohol abuse   . COPD (chronic obstructive pulmonary disease) ([place])   . Depression   . Reflux    Past Surgical History:  Procedure Laterality Date  . ABDOMINAL HYSTERECTOMY    . APPENDECTOMY     71 yo  . BLADDER SURGERY  2015  . CHOLECYSTECTOMY    . REPLACEMENT TOTAL KNEE    . ROTATOR CUFF REPAIR    . TONSILLECTOMY     Social History  Substance Use Topics  . Smoking status: Never Smoker  . Smokeless tobacco: Never Used  . Alcohol use No   family history includes Alcohol abuse in her mother.  ROS as above:  Medications: Current Outpatient Prescriptions  Medication Sig Dispense Refill  . AMBULATORY NON FORMULARY MEDICATION Blood pressure monitor use daily for hypertension. 1 Device 0  . Cholecalciferol (VITAMIN D3) 5000 units CAPS Take 5,000 Units by mouth daily.    . diclofenac sodium (VOLTAREN) 1 % GEL Apply 2 g topically 4 (four) times daily. To affected joint. 100 g 11  . digoxin (LANOXIN) 0.125 MG tablet TAKE 1 TABLET DAILY 90 tablet 0  . fluticasone (FLONASE) 50 MCG/ACT nasal spray SPRAY 1 SPRAY IN EACH NOSTRILS BID  0  . fluticasone-salmeterol (ADVAIR HFA) 115-21 MCG/ACT inhaler Inhale 2 puffs into the lungs 2 (two) times daily. 1 Inhaler 6  . Ipratropium-Albuterol (COMBIVENT) 20-100 MCG/ACT AERS respimat Inhale 1 puff  into the lungs every 6 (six) hours as needed for wheezing or shortness of breath. 1 Inhaler 5  . ipratropium-albuterol (DUONEB) 0.5-2.5 (3) MG/3ML SOLN Take 3 mLs by nebulization every 6 (six) hours as needed. 360 mL 1  . lisinopril (PRINIVIL,ZESTRIL) 10 MG tablet Take 1 tablet (10 mg total) by mouth daily. 90 tablet 1  . metoprolol succinate (TOPROL-XL) 100 MG 24 hr tablet Take 1 tablet (100 mg total) by mouth daily. Take with or immediately following a meal. 90 tablet 1  . omeprazole (PRILOSEC) 40 MG capsule Take 1 capsule (40 mg total) by mouth daily. 90 capsule 3  . triamterene-hydrochlorothiazide (MAXZIDE-25) 37.5-25 MG tablet Take 1 tablet by mouth daily. 90 tablet 1   No current facility-administered medications for this visit.    Allergies  Allergen Reactions  . Cobalt-Mn [Manganese]   . Compazine [Prochlorperazine]   . Corticosteroids   . Eggs Or Egg-Derived Products   . Penicillins     Health Maintenance Health Maintenance  Topic Date Due  . INFLUENZA VACCINE  09/03/2020 (Originally 02/20/2016)  . MAMMOGRAM  02/15/2017  . COLONOSCOPY  07/28/2017  . TETANUS/TDAP  11/19/2024  . DEXA SCAN  Completed  . ZOSTAVAX  Completed  . Hepatitis C Screening  Addressed  . PNA vac Low Risk Adult  Completed     Exam:  BP (!) 170/63   Pulse 60   Wt 188 lb (85.3 kg)  Gen: Well NAD HEENT: EOMI,  MMM Lungs:  Normal work of breathing. CTABL Heart: RRR no MRG Chest wall: Mildly tender to palpation right chest wall near the false ribs. Abd: NABS, Soft. Nondistended, Nontender Exts: Brisk capillary refill, warm and well perfused.   Right lateral rib x-ray with chest: No obvious fracture or acute lung findings.  Awaiting formal radiology review.   No results found for this or any previous visit (from the past 72 hour(s)). No results found.    Assessment and Plan: 71 y.o. female with rib contusion.  Plan for incentive spirometer, OTC meds and relative rest.  Follow up PRN.   Flu  vaccine given today. Patient notes that she gets nauseated sometimes with eggs but denies any serious Anaphylaxis or other life-threatening allergic reaction to eggs.   Orders Placed This Encounter  Procedures  . DG Ribs Unilateral W/Chest Right    Order Specific Question:   Reason for exam:    Answer:   Injury, rib pain. assess for fracture, pneumothorax.    Order Specific Question:   Preferred imaging location?    Answer:   [facility]   No orders of the defined types were placed in this encounter.    Discussed warning signs or symptoms. Please see discharge instructions. Patient expresses understanding.

## 2016-11-06 ENCOUNTER — Other Ambulatory Visit: Payer: Self-pay | Admitting: Family Medicine

## 2016-11-06 NOTE — Telephone Encounter (Signed)
Please review pended medication. Not on current medication list. Please advise.

## 2016-11-14 ENCOUNTER — Ambulatory Visit: Payer: Medicare Other

## 2016-11-14 ENCOUNTER — Ambulatory Visit: Payer: Medicare Other | Admitting: Family Medicine

## 2016-12-05 ENCOUNTER — Telehealth: Payer: Self-pay

## 2016-12-05 ENCOUNTER — Other Ambulatory Visit: Payer: Self-pay | Admitting: Family Medicine

## 2016-12-05 NOTE — Telephone Encounter (Signed)
Pt is requesting a 30 day supply of metoprolol sent to [facility] in [place].  She has called [facility], but will be out before it arrives.  Please advise.

## 2016-12-05 NOTE — Telephone Encounter (Signed)
Pt was asking about supplies from a sleep study test.  It looks like from you notes in Dec, that you were going to get the results of the most recent sleep study and she would get new supplies.  I do not see where the results were ever received.

## 2016-12-06 MED ORDER — METOPROLOL SUCCINATE ER 100 MG PO TB24: 100.0000 mg | ORAL_TABLET | Freq: Every day | ORAL | 0 refills | Status: DC

## 2016-12-06 NOTE — Telephone Encounter (Signed)
Done

## 2016-12-06 NOTE — Telephone Encounter (Signed)
Message left on vm 

## 2016-12-06 NOTE — Telephone Encounter (Signed)
I dont think we ever received the sleep study results. Plese contact the patient for the location and then call the location today and try to get the results expedited.

## 2016-12-09 ENCOUNTER — Ambulatory Visit (INDEPENDENT_AMBULATORY_CARE_PROVIDER_SITE_OTHER): Payer: Medicare Other

## 2016-12-09 ENCOUNTER — Ambulatory Visit (INDEPENDENT_AMBULATORY_CARE_PROVIDER_SITE_OTHER): Payer: Medicare Other | Admitting: Family Medicine

## 2016-12-09 ENCOUNTER — Encounter: Payer: Self-pay | Admitting: Family Medicine

## 2016-12-09 VITALS — BP 125/53 | HR 57 | Wt 183.0 lb

## 2016-12-09 DIAGNOSIS — Z0000 Encounter for general adult medical examination without abnormal findings: Secondary | ICD-10-CM

## 2016-12-09 DIAGNOSIS — J410 Simple chronic bronchitis: Secondary | ICD-10-CM | POA: Diagnosis not present

## 2016-12-09 DIAGNOSIS — I10 Essential (primary) hypertension: Secondary | ICD-10-CM

## 2016-12-09 DIAGNOSIS — I480 Paroxysmal atrial fibrillation: Secondary | ICD-10-CM

## 2016-12-09 DIAGNOSIS — M25512 Pain in left shoulder: Secondary | ICD-10-CM | POA: Insufficient documentation

## 2016-12-09 DIAGNOSIS — Z79899 Other long term (current) drug therapy: Secondary | ICD-10-CM

## 2016-12-09 DIAGNOSIS — R7303 Prediabetes: Secondary | ICD-10-CM | POA: Diagnosis not present

## 2016-12-09 DIAGNOSIS — J449 Chronic obstructive pulmonary disease, unspecified: Secondary | ICD-10-CM

## 2016-12-09 DIAGNOSIS — G4733 Obstructive sleep apnea (adult) (pediatric): Secondary | ICD-10-CM | POA: Diagnosis not present

## 2016-12-09 DIAGNOSIS — M8580 Other specified disorders of bone density and structure, unspecified site: Secondary | ICD-10-CM | POA: Diagnosis not present

## 2016-12-09 DIAGNOSIS — G8929 Other chronic pain: Secondary | ICD-10-CM

## 2016-12-09 MED ORDER — ZOSTER VAC RECOMB ADJUVANTED 50 MCG/0.5ML IM SUSR: 0.5000 mL | Freq: Once | INTRAMUSCULAR | 1 refills | Status: AC

## 2016-12-09 NOTE — Progress Notes (Signed)
HPI: Kimberly Jackson is a 71 y.o. female  who presents to [place] today, 12/09/16,  for Medicare Annual Wellness Exam  Patient presents for annual physical/Medicare wellness exam. Left shoulder pain is the main complaint today. Patient has been ongoing for several months and is resolution of her head motion. Worse with activity better with rest. No radiating pain weakness or numbness.   Past medical, surgical, social and family history reviewed:  Patient Active Problem List   Diagnosis Date Noted  . Left shoulder pain 12/09/2016  . OSA and COPD overlap syndrome ([place]) 07/05/2016  . Right foot pain 12/07/2015  . Vitamin D deficiency 09/11/2015  . Prediabetes 09/08/2015  . Osteopenia 09/08/2015  . Ventral hernia 09/08/2015  . COPD (chronic obstructive pulmonary disease) ([place]) 09/04/2015  . Paroxysmal A-fib ([place]) 09/04/2015  . HTN (hypertension) 09/04/2015  . Long-term use of high-risk medication 09/04/2015    Past Surgical History:  Procedure Laterality Date  . ABDOMINAL HYSTERECTOMY    . APPENDECTOMY     71 yo  . BLADDER SURGERY  2015  . CHOLECYSTECTOMY    . REPLACEMENT TOTAL KNEE    . ROTATOR CUFF REPAIR    . TONSILLECTOMY      Social History   Social History  . Marital status: Married    Spouse name: N/A  . Number of children: N/A  . Years of education: N/A   Occupational History  . Not on file.   Social History Main Topics  . Smoking status: Never Smoker  . Smokeless tobacco: Never Used  . Alcohol use No  . Drug use: No  . Sexual activity: Not on file   Other Topics Concern  . Not on file   Social History Narrative  . No narrative on file    Family History  Problem Relation Age of Onset  . Alcohol abuse Mother      Current medication list and allergy/intolerance information reviewed:    Outpatient Encounter Prescriptions as of 12/09/2016  Medication Sig Note  . AMBULATORY NON FORMULARY MEDICATION Blood  pressure monitor use daily for hypertension.   . Cholecalciferol (VITAMIN D3) 5000 units CAPS Take 5,000 Units by mouth daily.   . diclofenac sodium (VOLTAREN) 1 % GEL Apply 2 g topically 4 (four) times daily. To affected joint.   . digoxin (LANOXIN) 0.125 MG tablet Take 1 tablet (125 mcg total) by mouth daily. Due for follow up visit   . fluticasone (FLONASE) 50 MCG/ACT nasal spray SPRAY 1 SPRAY IN EACH NOSTRILS BID 09/04/2015: Received from: External Pharmacy  . fluticasone-salmeterol (ADVAIR HFA) 115-21 MCG/ACT inhaler Inhale 2 puffs into the lungs 2 (two) times daily.   . Ipratropium-Albuterol (COMBIVENT) 20-100 MCG/ACT AERS respimat Inhale 1 puff into the lungs every 6 (six) hours as needed for wheezing or shortness of breath.   [name] ipratropium-albuterol (DUONEB) 0.5-2.5 (3) MG/3ML SOLN Take 3 mLs by nebulization every 6 (six) hours as needed.   [name] lisinopril (PRINIVIL,ZESTRIL) 10 MG tablet Take 1 tablet (10 mg total) by mouth daily.   . metoprolol succinate (TOPROL-XL) 100 MG 24 hr tablet Take 1 tablet (100 mg total) by mouth daily. Take with or immediately following a meal.   . omeprazole (PRILOSEC) 40 MG capsule Take 1 capsule (40 mg total) by mouth daily.   [name] triamterene-hydrochlorothiazide (MAXZIDE-25) 37.5-25 MG tablet Take 1 tablet by mouth daily.   . [DISCONTINUED] DIGITEK 125 MCG tablet take 1 tablet by mouth once daily   .  Zoster Vac Recomb Adjuvanted [facility]) injection Inject 0.5 mLs into the muscle once. Give again after 2 months. If administered in clinic please fax report to Dr Georgina Snell [phone]    No facility-administered encounter medications on file as of 12/09/2016.     Allergies  Allergen Reactions  . Cobalt-Mn [Manganese]   . Compazine [Prochlorperazine]   . Corticosteroids   . Eggs Or Egg-Derived Products   . Penicillins        Review of Systems: No headache, visual changes, nausea, vomiting, diarrhea, constipation, dizziness, abdominal pain, skin rash, fevers,  chills, night sweats, weight loss, swollen lymph nodes, body aches, joint swelling, muscle aches, chest pain, shortness of breath, mood changes, visual or auditory hallucinations.     Medicare Wellness Questionnaire  Are there smokers in your home (other than you)? no  Depression screen PHQ 2/9 02/06/2016  Decreased Interest 0  Down, Depressed, Hopeless 0  PHQ - 2 Score 0        Activities of Daily Living In your present state of health, do you have any difficulty performing the following activities?:  Driving? no Managing money?  no Feeding yourself? no Getting from bed to chair? no Climbing a flight of stairs? no Preparing food and eating?: no Bathing or showering? no Getting dressed: no Getting to the toilet? no Using the toilet: no Moving around from place to place: no In the past year have you fallen or had a near fall?: no  Hearing Difficulties:  Do you often ask people to speak up or repeat themselves? no Do you experience ringing or noises in your ears? no  Do you have difficulty understanding soft or whispered voices? no  Memory Difficulties:  Do you feel that you have a problem with memory? no  Do you often misplace items? no  Do you feel safe at home?  no  Sexual Health:   Are you sexually active?  Yes  Do you have more than one partner?  No   Risk Factors  Current exercise habits: not much  Dietary issues discussed:none  Cardiac risk factors: Present   Exam:  BP (!) 125/53   Pulse (!) 57   Wt 183 lb (83 kg)  Vision by Snellen chart: right eye:see nurse notes, left eye:see nurse notes  Constitutional: VS see above. General Appearance: alert, well-developed, well-nourished, NAD  Ears, Nose, Mouth, Throat: MMM  Neck: No masses, trachea midline.   Respiratory: Normal respiratory effort. no wheeze, no rhonchi, no rales  Cardiovascular:No lower extremity edema.   Musculoskeletal: Gait normal. No clubbing/cyanosis of digits.   Neurological:  Normal balance/coordination. No tremor. Recalls 3 objects and able to read face of watch with correct time.   Skin: warm, dry, intact. No rash/ulcer.   Psychiatric: Normal judgment/insight. Normal mood and affect. Oriented x3.  MSK: Left shoulder normal-appearing nontender. Range of motion abduction limited to 100 active and passive external rotation 30 and internal rotation to the lumbar spine. Positive Hawkin's test. Upper extremity strength is intact.   ASSESSMENT/PLAN:   Encounter for Medicare annual wellness exam  Paroxysmal A-fib ([place]) - Plan: CBC, COMPLETE METABOLIC PANEL WITH GFR, Lipid Panel w/reflex Direct LDL, TSH, Hemoglobin A1c  Chronic left shoulder pain - Plan: DG Shoulder Left, CBC, COMPLETE METABOLIC PANEL WITH GFR, Lipid Panel w/reflex Direct LDL, TSH, Hemoglobin A1c  Essential hypertension - Plan: CBC, COMPLETE METABOLIC PANEL WITH GFR, Lipid Panel w/reflex Direct LDL, TSH, Hemoglobin A1c  OSA and COPD overlap syndrome (HCC) - Plan: CBC, COMPLETE METABOLIC  PANEL WITH GFR, Lipid Panel w/reflex Direct LDL, TSH, Hemoglobin A1c, Home sleep test  Simple chronic bronchitis (H[facility]) - Plan: CBC, COMPLETE METABOLIC PANEL WITH GFR, Lipid Panel w/reflex Direct LDL, TSH, Hemoglobin A1c  Long-term use of high-risk medication - Plan: CBC, COMPLETE METABOLIC PANEL WITH GFR, Lipid Panel w/reflex Direct LDL, TSH, Hemoglobin A1c  Prediabetes - Plan: CBC, COMPLETE METABOLIC PANEL WITH GFR, Lipid Panel w/reflex Direct LDL, TSH, Hemoglobin A1c  Osteopenia, unspecified location - Plan: CBC, COMPLETE METABOLIC PANEL WITH GFR, Lipid Panel w/reflex Direct LDL, TSH, Hemoglobin A1c  Health Maintenance Health Maintenance  Topic Date Due  . MAMMOGRAM  02/15/2017  . INFLUENZA VACCINE  02/19/2017  . COLONOSCOPY  07/28/2017  . TETANUS/TDAP  11/19/2024  . DEXA SCAN  Completed  . Hepatitis C Screening  Completed  . PNA vac Low Risk Adult  Completed    Immunization History  Administered  Date(s) Administered  . Influenza,inj,Quad PF,36+ Mos 09/03/2016  . Pneumococcal Conjugate-13 01/04/2016  . Pneumococcal Polysaccharide-23 04/21/2013  . Tdap 11/20/2014  . Zoster 01/04/2016     During the course of the visit the patient was educated and counseled about appropriate screening and preventive services as noted above.   Patient Instructions (the written plan) was given to the patient.  Medicare Attestation I have personally reviewed: The patient's medical and social history Their use of alcohol, tobacco or illicit drugs Their current medications and supplements The patient's functional ability including ADLs,fall risks, home safety risks, cognitive, and hearing and visual impairment Diet and physical activities Evidence for depression or mood disorders  The patient's weight, height, BMI, and visual acuity have been recorded in the chart.  I have made referrals, counseling, and provided education to the patient based on review of the above and I have provided the patient with a written personalized care plan for preventive services.

## 2016-12-09 NOTE — Patient Instructions (Signed)
Thank you for coming in today. Get xray today and labs fasting soon.  Recheck in a few weeks for injection to the left shoulder.

## 2016-12-13 DIAGNOSIS — Z79899 Other long term (current) drug therapy: Secondary | ICD-10-CM | POA: Diagnosis not present

## 2016-12-13 DIAGNOSIS — G4733 Obstructive sleep apnea (adult) (pediatric): Secondary | ICD-10-CM | POA: Diagnosis not present

## 2016-12-13 DIAGNOSIS — G8929 Other chronic pain: Secondary | ICD-10-CM | POA: Diagnosis not present

## 2016-12-13 DIAGNOSIS — M25512 Pain in left shoulder: Secondary | ICD-10-CM | POA: Diagnosis not present

## 2016-12-13 DIAGNOSIS — I480 Paroxysmal atrial fibrillation: Secondary | ICD-10-CM | POA: Diagnosis not present

## 2016-12-13 DIAGNOSIS — J449 Chronic obstructive pulmonary disease, unspecified: Secondary | ICD-10-CM | POA: Diagnosis not present

## 2016-12-13 DIAGNOSIS — R7303 Prediabetes: Secondary | ICD-10-CM | POA: Diagnosis not present

## 2016-12-13 DIAGNOSIS — J410 Simple chronic bronchitis: Secondary | ICD-10-CM | POA: Diagnosis not present

## 2016-12-13 DIAGNOSIS — M8580 Other specified disorders of bone density and structure, unspecified site: Secondary | ICD-10-CM | POA: Diagnosis not present

## 2016-12-13 DIAGNOSIS — I10 Essential (primary) hypertension: Secondary | ICD-10-CM | POA: Diagnosis not present

## 2016-12-13 LAB — CBC
HCT: 40.5 % (ref 35.0–45.0)
Hemoglobin: 13.5 g/dL (ref 11.7–15.5)
MCH: 30.1 pg (ref 27.0–33.0)
MCHC: 33.3 g/dL (ref 32.0–36.0)
MCV: 90.2 fL (ref 80.0–100.0)
MPV: 9.9 fL (ref 7.5–12.5)
PLATELETS: 303 10*3/uL (ref 140–400)
RBC: 4.49 MIL/uL (ref 3.80–5.10)
RDW: 13.8 % (ref 11.0–15.0)
WBC: 5 10*3/uL (ref 3.8–10.8)

## 2016-12-14 LAB — COMPLETE METABOLIC PANEL WITHOUT GFR
ALT: 9 U/L (ref 6–29)
AST: 15 U/L (ref 10–35)
Albumin: 4 g/dL (ref 3.6–5.1)
Alkaline Phosphatase: 115 U/L (ref 33–130)
BUN: 16 mg/dL (ref 7–25)
CO2: 28 mmol/L (ref 20–31)
Calcium: 9.2 mg/dL (ref 8.6–10.4)
Chloride: 101 mmol/L (ref 98–110)
Creat: 0.88 mg/dL (ref 0.60–0.93)
GFR, Est African American: 75 mL/min
GFR, Est Non African American: 65 mL/min
Glucose, Bld: 83 mg/dL (ref 65–99)
Potassium: 4.7 mmol/L (ref 3.5–5.3)
Sodium: 139 mmol/L (ref 135–146)
Total Bilirubin: 0.8 mg/dL (ref 0.2–1.2)
Total Protein: 6.7 g/dL (ref 6.1–8.1)

## 2016-12-14 LAB — LIPID PANEL W/REFLEX DIRECT LDL
CHOL/HDL RATIO: 3.3 ratio (ref <5.0)
Cholesterol: 184 mg/dL (ref <200)
HDL: 56 mg/dL (ref >50)
LDL-CHOLESTEROL: 105 mg/dL — AB
Non-HDL Cholesterol (Calc): 128 mg/dL (ref <130)
TRIGLYCERIDES: 129 mg/dL (ref <150)

## 2016-12-14 LAB — HEMOGLOBIN A1C
Hgb A1c MFr Bld: 5.4 %
Mean Plasma Glucose: 108 mg/dL

## 2016-12-14 LAB — TSH: TSH: 2.86 m[IU]/L

## 2017-01-06 ENCOUNTER — Ambulatory Visit (INDEPENDENT_AMBULATORY_CARE_PROVIDER_SITE_OTHER): Payer: Medicare Other | Admitting: Family Medicine

## 2017-01-06 ENCOUNTER — Encounter: Payer: Self-pay | Admitting: Family Medicine

## 2017-01-06 VITALS — BP 131/53 | HR 62 | Wt 178.0 lb

## 2017-01-06 DIAGNOSIS — G4733 Obstructive sleep apnea (adult) (pediatric): Secondary | ICD-10-CM

## 2017-01-06 DIAGNOSIS — J449 Chronic obstructive pulmonary disease, unspecified: Secondary | ICD-10-CM | POA: Diagnosis not present

## 2017-01-06 DIAGNOSIS — G8929 Other chronic pain: Secondary | ICD-10-CM

## 2017-01-06 DIAGNOSIS — M25512 Pain in left shoulder: Secondary | ICD-10-CM | POA: Diagnosis not present

## 2017-01-06 DIAGNOSIS — I10 Essential (primary) hypertension: Secondary | ICD-10-CM

## 2017-01-06 LAB — BASIC METABOLIC PANEL WITH GFR
BUN: 13 mg/dL (ref 7–25)
CALCIUM: 9.2 mg/dL (ref 8.6–10.4)
CO2: 27 mmol/L (ref 20–31)
Chloride: 101 mmol/L (ref 98–110)
Creat: 0.74 mg/dL (ref 0.60–0.93)
GFR, Est Non African American: 81 mL/min (ref >=60)
Glucose, Bld: 89 mg/dL (ref 65–99)
POTASSIUM: 4 mmol/L (ref 3.5–5.3)
Sodium: 138 mmol/L (ref 135–146)

## 2017-01-06 NOTE — Patient Instructions (Addendum)
Thank you for coming in today. Attend Physical Therapy.  I recommend Imodium for diarrhea as needed.  We will get labs today.  You should hear about the sleep study mask fitting soon.  Recheck in 6 weeks or so to follow up shoulder.    Viral Gastroenteritis, Adult Viral gastroenteritis is also known as the stomach flu. This condition is caused by certain germs (viruses). These germs can be passed from person to person very easily (are very contagious). This condition can cause sudden watery poop (diarrhea), fever, and throwing up (vomiting). Having watery poop and throwing up can make you feel weak and cause you to get dehydrated. Dehydration can make you tired and thirsty, make you have a dry mouth, and make it so you pee (urinate) less often. Older adults and people with other diseases or a weak defense system (immune system) are at higher risk for dehydration. It is important to replace the fluids that you lose from having watery poop and throwing up. Follow these instructions at home: Follow instructions from your doctor about how to care for yourself at home. Eating and drinking  Follow these instructions as told by your doctor:  Take an oral rehydration solution (ORS). This is a drink that is sold at pharmacies and stores.  Drink clear fluids in small amounts as you are able, such as: ? Water. ? Ice chips. ? Diluted fruit juice. ? Low-calorie sports drinks.  Eat bland, easy-to-digest foods in small amounts as you are able, such as: ? Bananas. ? Applesauce. ? Rice. ? Low-fat (lean) meats. ? Toast. ? Crackers.  Avoid fluids that have a lot of sugar or caffeine in them.  Avoid alcohol.  Avoid spicy or fatty foods.  General instructions  Drink enough fluid to keep your pee (urine) clear or pale yellow.  Wash your hands often. If you cannot use soap and water, use hand sanitizer.  Make sure that all people in your home wash their hands well and often.  Rest at home  while you get better.  Take over-the-counter and prescription medicines only as told by your doctor.  Watch your condition for any changes.  Take a warm bath to help with any burning or pain from having watery poop.  Keep all follow-up visits as told by your doctor. This is important. Contact a doctor if:  You cannot keep fluids down.  Your symptoms get worse.  You have new symptoms.  You feel light-headed or dizzy.  You have muscle cramps. Get help right away if:  You have chest pain.  You feel very weak or you pass out (faint).  You see blood in your throw-up.  Your throw-up looks like coffee grounds.  You have bloody or black poop (stools) or poop that look like tar.  You have a very bad headache, a stiff neck, or both.  You have a rash.  You have very bad pain, cramping, or bloating in your belly (abdomen).  You have trouble breathing.  You are breathing very quickly.  Your heart is beating very quickly.  Your skin feels cold and clammy.  You feel confused.  You have pain when you pee.  You have signs of dehydration, such as: ? Dark pee, hardly any pee, or no pee. ? Cracked lips. ? Dry mouth. ? Sunken eyes. ? Sleepiness. ? Weakness. This information is not intended to replace advice given to you by your health care provider. Make sure you discuss any questions you have with your health  care provider. Document Released: 12/25/2007 Document Revised: 01/26/2016 Document Reviewed: 03/14/2015 Elsevier Interactive Patient Education  2017 [employer].

## 2017-01-06 NOTE — Progress Notes (Signed)
Kimberly Jackson is a 71 y.o. female who presents to [facility] today for follow-up left shoulder pain, and discuss CPAP titration, and salt craving.   Left Shoulder Pain: Kimberly Jackson notes ongoing chronic left shoulder pain for a few months. The pain is located in the lateral upper arms worse with overhead motion . She notes over the last month the patient has been improving slightly. She denies significant radiating pain weakness or numbness. She's tried some over-the-counter medications which have helped some.  Salt craving: Patient notes over the last month or so increased salt craving. She denies any lightheadedness or dizziness or cramping.  CPAP titration: Patient has a history of sleep apnea. She's not been using her CPAP recently because she finds the mask fit and CPAP settings to be very uncomfortable.   Past Medical History:  Diagnosis Date  . Alcohol abuse   . COPD (chronic obstructive pulmonary disease) ([place])   . Depression   . Reflux    Past Surgical History:  Procedure Laterality Date  . ABDOMINAL HYSTERECTOMY    . APPENDECTOMY     71 yo  . BLADDER SURGERY  2015  . CHOLECYSTECTOMY    . REPLACEMENT TOTAL KNEE    . ROTATOR CUFF REPAIR    . TONSILLECTOMY     Social History  Substance Use Topics  . Smoking status: Never Smoker  . Smokeless tobacco: Never Used  . Alcohol use No   family history includes Alcohol abuse in her mother.  ROS as above:  Medications: Current Outpatient Prescriptions  Medication Sig Dispense Refill  . AMBULATORY NON FORMULARY MEDICATION Blood pressure monitor use daily for hypertension. 1 Device 0  . Cholecalciferol (VITAMIN D3) 5000 units CAPS Take 5,000 Units by mouth daily.    . diclofenac sodium (VOLTAREN) 1 % GEL Apply 2 g topically 4 (four) times daily. To affected joint. 100 g 11  . digoxin (LANOXIN) 0.125 MG  tablet Take 1 tablet (125 mcg total) by mouth daily. Due for follow up visit 30 tablet 0  . fluticasone (FLONASE) 50 MCG/ACT nasal spray SPRAY 1 SPRAY IN EACH NOSTRILS BID  0  . fluticasone-salmeterol (ADVAIR HFA) 115-21 MCG/ACT inhaler Inhale 2 puffs into the lungs 2 (two) times daily. 1 Inhaler 6  . Ipratropium-Albuterol (COMBIVENT) 20-100 MCG/ACT AERS respimat Inhale 1 puff into the lungs every 6 (six) hours as needed for wheezing or shortness of breath. 1 Inhaler 5  . ipratropium-albuterol (DUONEB) 0.5-2.5 (3) MG/3ML SOLN Take 3 mLs by nebulization every 6 (six) hours as needed. 360 mL 1  . lisinopril (PRINIVIL,ZESTRIL) 10 MG tablet Take 1 tablet (10 mg total) by mouth daily. 90 tablet 1  . metoprolol succinate (TOPROL-XL) 100 MG 24 hr tablet Take 1 tablet (100 mg total) by mouth daily. Take with or immediately following a meal. 90 tablet 1  . omeprazole (PRILOSEC) 40 MG capsule Take 1 capsule (40 mg total) by mouth daily. 90 capsule 3  . triamterene-hydrochlorothiazide (MAXZIDE-25) 37.5-25 MG tablet Take 1 tablet by mouth daily. 90 tablet 1   No current facility-administered medications for this visit.    Allergies  Allergen Reactions  . Cobalt-Mn [Manganese]   . Compazine [Prochlorperazine]   . Corticosteroids   . Eggs Or Egg-Derived Products   . Penicillins     Health Maintenance Health Maintenance  Topic Date Due  . MAMMOGRAM  02/15/2017  . INFLUENZA VACCINE  02/19/2017  . COLONOSCOPY  07/28/2017  . TETANUS/TDAP  11/19/2024  . DEXA SCAN  Completed  . Hepatitis C Screening  Completed  . PNA vac Low Risk Adult  Completed     Exam:  BP (!) 131/53   Pulse 62   Wt 178 lb (80.7 kg)  Gen: Well NAD HEENT: EOMI,  MMM Lungs: Normal work of breathing. CTABL Heart: RRR no MRG Abd: NABS, Soft. Nondistended, Nontender Exts: Brisk capillary refill, warm and well perfused.  Left shoulder: Normal-appearing Tender palpation acromioclavicular joint. Range of motion normal external  and internal motion. Abduction limited to 110 active. Full motion passive. Strength diminished abduction and internal rotation normal external rotation Positive empty can Hawkins and Neer's test Positive crossover arm compression test Pulses capillary refill and sensation intact distally  X-ray left shoulder May 2018 reviewed  Limited musculoskeletal ultrasound of the left shoulder reveals degenerative appearance of the supraspinatus and subscapularis tendons. No full thickness tears or visible. Mild subacromial bursitis is present.  No results found for this or any previous visit (from the past 72 hour(s)). No results found.    Assessment and Plan: 71 y.o. female with  Left shoulder pain: Likely rotator cuff tendinopathy. Discussed options. Patient had a significant episode of suicidality with steroids in the past. Plan for trial physical therapy and topical NSAIDs. Recheck in 6 weeks  Salt craving: We'll check basic metabolic panel for sodium levels.  CPAP: Plan for CPAP titration to address CPAP settings and mask fit.    Orders Placed This Encounter  Procedures  . BASIC METABOLIC PANEL WITH GFR  . Ambulatory referral to Physical Therapy    Referral Priority:   Routine    Referral Type:   Physical Medicine    Referral Reason:   Specialty Services Required    Requested Specialty:   Physical Therapy    Number of Visits Requested:   1   No orders of the defined types were placed in this encounter.    Discussed warning signs or symptoms. Please see discharge instructions. Patient expresses understanding.  I spent 40 minutes with this patient, greater than 50% was face-to-face time counseling regarding the above diagnosis.

## 2017-01-10 ENCOUNTER — Encounter: Payer: Self-pay | Admitting: Physical Therapy

## 2017-01-10 ENCOUNTER — Ambulatory Visit (INDEPENDENT_AMBULATORY_CARE_PROVIDER_SITE_OTHER): Payer: Medicare Other | Admitting: Physical Therapy

## 2017-01-10 DIAGNOSIS — G8929 Other chronic pain: Secondary | ICD-10-CM | POA: Diagnosis not present

## 2017-01-10 DIAGNOSIS — M6281 Muscle weakness (generalized): Secondary | ICD-10-CM

## 2017-01-10 DIAGNOSIS — R293 Abnormal posture: Secondary | ICD-10-CM

## 2017-01-10 DIAGNOSIS — M25612 Stiffness of left shoulder, not elsewhere classified: Secondary | ICD-10-CM | POA: Diagnosis not present

## 2017-01-10 DIAGNOSIS — M25512 Pain in left shoulder: Secondary | ICD-10-CM

## 2017-01-10 NOTE — Patient Instructions (Addendum)
Scapular Retraction (Standing)    With arms at sides, pinch shoulder blades together. Repeat __5__ times per set. Do __1__ sets per session. Do __5-6__ sessions per day.  Flexibility: Neck Retraction   Pull head straight back, keeping eyes and jaw level. Repeat __5__ times per set. Do __1__ sets per session. Do __5-6__ sessions per day  Resisted External Rotation: in Neutral - Bilateral    Sit or stand, tubing in both hands, elbows at sides, bent to 90, forearms forward. Pinch shoulder blades together and rotate forearms out. Keep elbows at sides. Repeat _10___ times per set. Do ___3_ sets per session. Do _1___ sessions per day.  Scapula Adduction With Pectorals, Low   Stand in doorframe with palms against frame and arms at 45. Lean forward and squeeze shoulder blades. Hold _30__ seconds. Repeat __1_ times per session. Do __1_ sessions per day.  Scapula Adduction With Pectorals, Mid-Range   Stand in doorframe with palms against frame and arms at 90. Lean forward and squeeze shoulder blades. Hold _30__ seconds. Repeat _1__ times per session. Do _1__ sessions per day. Copyright  VHI. All rights reserved.

## 2017-01-10 NOTE — Therapy (Signed)
[place], [place], [address] Phone: [phone]   Fax:  [phone]  Physical Therapy Evaluation  Patient Details  Name: Kimberly Jackson MRN: [id number] Date of Birth: Dec 09, 1945 Referring Provider: Dr Steva Colder  Encounter Date: 01/10/2017      PT End of Session - 01/10/17 1103    Visit Number 1   Number of Visits 12   Date for PT Re-Evaluation 02/21/17   PT Start Time 1103   PT Stop Time 1153   PT Time Calculation (min) 50 min   Activity Tolerance Patient tolerated treatment well      Past Medical History:  Diagnosis Date  . Alcohol abuse   . COPD (chronic obstructive pulmonary disease) ([place])   . Depression   . Reflux     Past Surgical History:  Procedure Laterality Date  . ABDOMINAL HYSTERECTOMY    . APPENDECTOMY     71 yo  . BLADDER SURGERY  2015  . CHOLECYSTECTOMY    . REPLACEMENT TOTAL KNEE    . ROTATOR CUFF REPAIR    . TONSILLECTOMY      There were no vitals filed for this visit.       Subjective Assessment - 01/10/17 1103    Subjective Kimberly Jackson reports she noticed a change in her Lt shoulder pain about 4 months ago. She has a lot of arthritis and thought it was from that.  It has progressed and now interfers with sleeping so she went to the MD.  has some difficulty reaching for seatbelt, donning bra and reaching behind her.    Pertinent History osteopenia, RTC repair Rt 5-6 yrs ago, Lt TKA 5-6 yrs    Diagnostic tests x-rays - no fractures, old h/o clavicle fx   Patient Stated Goals tolerate the pain more and perform daily activities without pain   Currently in Pain? Yes   Pain Score 2    Pain Location Shoulder   Pain Orientation Left   Pain Descriptors / Indicators Aching   Pain Type Chronic pain   Pain Radiating Towards sometimes down into the arm.   Pain Onset More than a month ago   Pain Frequency Intermittent   Aggravating Factors  reaching behind, sleeping on it   Pain Relieving Factors not tried anything lately.             [facility] PT Assessment - 01/10/17 0001      Assessment   Medical Diagnosis chronic Lt shoulder pain   Referring Provider Dr Steva Colder   Onset Date/Surgical Date 09/12/16   Hand Dominance Right   Next MD Visit 02/14/17   Prior Therapy not for Lt shoulder     Precautions   Precautions None     Balance Screen   Has the patient fallen in the past 6 months No     [place] residence   Living Arrangements Spouse/significant other   Home Layout One level     Prior Function   Level of Independence --  some difficulty with dressing andf fix hair   Vocation Retired   Psychologist, forensic ( uses Rt UE )     Observation/Other Assessments   Focus on Therapeutic Outcomes (FOTO)  50% limited     Posture/Postural Control   Posture/Postural Control Postural limitations   Postural Limitations Rounded Shoulders;Forward head;Increased lumbar lordosis     ROM / Strength   AROM / PROM / Strength AROM;Strength  AROM   AROM Assessment Site Cervical;Shoulder;Elbow   Right/Left Shoulder Right;Left  pain with all motion Lt shoulder   Right Shoulder Extension 47 Degrees   Right Shoulder Flexion 157 Degrees   Right Shoulder ABduction 140 Degrees   Right Shoulder Internal Rotation 58 Degrees  seated arm 90 degrees scaption   Right Shoulder External Rotation 85 Degrees  seated arm 90 degrees scaption   Left Shoulder Extension 45 Degrees   Left Shoulder Flexion 130 Degrees   Left Shoulder ABduction 135 Degrees   Left Shoulder Internal Rotation 25 Degrees  seated arm 90 degree scaption   Left Shoulder External Rotation 72 Degrees  arm 90 degrees scaption   Right/Left Elbow --  WNL, limited Rt wrist limited old injury    Cervical Flexion 57   Cervical Extension 43   Cervical - Right Rotation 74   Cervical - Left Rotation 67     Strength   Strength Assessment Site Shoulder;Elbow   Right/Left  Shoulder Left  Rt WNL   Left Shoulder Flexion --  5-/5   Left Shoulder Extension 5/5   Left Shoulder ABduction 4/5   Left Shoulder Internal Rotation 5/5   Left Shoulder External Rotation 4/5   Right/Left Elbow --  bilat WNL     Palpation   Palpation comment tightness in posterior shoulder and pecs     Special Tests    Special Tests --  (+) Lt shoulder impingement, (-) spurlings            Objective measurements completed on examination: See above findings.     Therapeutic exercise completed her HEP handout with VC for form.              PT Education - 01/10/17 1253    Education provided Yes   Education Details HEP   Person(s) Educated Patient   Methods Explanation;Demonstration;Handout   Comprehension Returned demonstration;Verbalized understanding             PT Long Term Goals - 01/10/17 1256      PT LONG TERM GOAL #1   Title I with advanced HEP (02/21/17)    Time 6   Period Weeks   Status New     PT LONG TERM GOAL #2   Title improve Lt shoulder ROM to within 10 degrees of Rt to increase ease of dressing ( 02/21/17)    Time 6   Period Weeks   Status New     PT LONG TERM GOAL #3   Title increase strength Lt shoulder =/> 5-/5 to assist with daily activities ( 02/21/17)    Time 6   Period Weeks   Status New     PT LONG TERM GOAL #4   Title improve FOTO =/< 36% limited, CJ level ( 02/21/17)    Time 6   Period Weeks   Status New     PT LONG TERM GOAL #5   Title report decrease Lt shoulder pain to allow her to sleep per her previous level ( 02/21/17)    Time 6   Period Weeks   Status New                Plan - 01/10/17 1254    Clinical Impression Statement 71 yo female presents with ~ 2 month h/o increasing Lt shoulder pain.  She has decreased ROM and strength along with muscular imbalances and postural changes.  These are limiting her ability to perform ADLs    Clinical Presentation Evolving  Clinical Decision Making Low   Rehab  Potential Excellent   PT Frequency 2x / week   PT Duration 6 weeks   PT Treatment/Interventions Moist Heat;Ultrasound;Therapeutic exercise;Dry needling;Taping;Vasopneumatic Device;Manual techniques;Neuromuscular re-education;Cryotherapy;Electrical Stimulation;Iontophoresis 4mg /ml Dexamethasone;Patient/family education   PT Next Visit Plan manual work, postural re-ed, modalities PRN   Consulted and Agree with Plan of Care Patient      Patient will benefit from skilled therapeutic intervention in order to improve the following deficits and impairments:  Decreased range of motion, Impaired UE functional use, Increased muscle spasms, Pain, Decreased strength  Visit Diagnosis: Chronic left shoulder pain - Plan: PT plan of care cert/re-cert  Muscle weakness (generalized) - Plan: PT plan of care cert/re-cert  Abnormal posture - Plan: PT plan of care cert/re-cert  Stiffness of left shoulder, not elsewhere classified - Plan: PT plan of care cert/re-cert      [facility] PT PB G-CODES - January 20, 2017 1619    Functional Assessment Tool Used  FOTO and professional judgement   Functional Limitations Carrying, moving and handling objects   Carrying, Moving and Handling Objects Current Status [phone]) At least 40 percent but less than 60 percent impaired, limited or restricted   Carrying, Moving and Handling Objects Goal Status ([id number]) At least 20 percent but less than 40 percent impaired, limited or restricted       Problem List Patient Active Problem List   Diagnosis Date Noted  . Left shoulder pain 12/09/2016  . OSA and COPD overlap syndrome ([place]) 07/05/2016  . Right foot pain 12/07/2015  . Vitamin D deficiency 09/11/2015  . Prediabetes 09/08/2015  . Osteopenia 09/08/2015  . Ventral hernia 09/08/2015  . COPD (chronic obstructive pulmonary disease) ([place]) 09/04/2015  . Paroxysmal A-fib ([place]) 09/04/2015  . HTN (hypertension) 09/04/2015  . Long-term use of high-risk medication 09/04/2015    Jeral Pinch PT  01/20/17, 4:20 PM  [facility] [place], [place], [address] Phone: [phone]   Fax:  [phone]  Name: Jisele Price MRN: [id number] Date of Birth: 1946-07-11

## 2017-01-15 ENCOUNTER — Ambulatory Visit (INDEPENDENT_AMBULATORY_CARE_PROVIDER_SITE_OTHER): Payer: Medicare Other | Admitting: Physical Therapy

## 2017-01-15 ENCOUNTER — Encounter: Payer: Self-pay | Admitting: Physical Therapy

## 2017-01-15 DIAGNOSIS — G8929 Other chronic pain: Secondary | ICD-10-CM

## 2017-01-15 DIAGNOSIS — M6281 Muscle weakness (generalized): Secondary | ICD-10-CM

## 2017-01-15 DIAGNOSIS — R293 Abnormal posture: Secondary | ICD-10-CM | POA: Diagnosis not present

## 2017-01-15 DIAGNOSIS — M25612 Stiffness of left shoulder, not elsewhere classified: Secondary | ICD-10-CM | POA: Diagnosis present

## 2017-01-15 DIAGNOSIS — M25512 Pain in left shoulder: Secondary | ICD-10-CM | POA: Diagnosis not present

## 2017-01-15 NOTE — Therapy (Signed)
[place], [place], [address] Phone: [phone]   Fax:  [phone]  Physical Therapy Treatment  Patient Details  Name: Kimberly Jackson MRN: [id number] Date of Birth: January 12, 1946 Referring Provider: Dr Steva Colder  Encounter Date: 01/15/2017      PT End of Session - 01/15/17 1600    Visit Number 2   Number of Visits 12   Date for PT Re-Evaluation 02/21/17   PT Start Time 1600   PT Stop Time 1645  HP at end   PT Time Calculation (min) 45 min   Activity Tolerance Patient tolerated treatment well      Past Medical History:  Diagnosis Date  . Alcohol abuse   . COPD (chronic obstructive pulmonary disease) ([place])   . Depression   . Reflux     Past Surgical History:  Procedure Laterality Date  . ABDOMINAL HYSTERECTOMY    . APPENDECTOMY     71 yo  . BLADDER SURGERY  2015  . CHOLECYSTECTOMY    . REPLACEMENT TOTAL KNEE    . ROTATOR CUFF REPAIR    . TONSILLECTOMY      There were no vitals filed for this visit.      Subjective Assessment - 01/15/17 1600    Subjective Kimberly Jackson reports that her shoulder hasn't been feeling great. Working it hurts, mostly the doorway stretches, that is worse than the band.    Patient Stated Goals tolerate the pain more and perform daily activities without pain   Currently in Pain? Yes   Pain Score 1    Pain Location Shoulder   Pain Orientation Left   Pain Descriptors / Indicators Dull   Pain Type Chronic pain   Pain Onset More than a month ago   Pain Frequency Intermittent   Aggravating Factors  sleeping on it or doing certain things.    Pain Relieving Factors resting the arm.                          [facility] Adult PT Treatment/Exercise - 01/15/17 0001      Exercises   Exercises Shoulder     Shoulder Exercises: Supine   Horizontal ABduction Strengthening;Both;20 reps;Theraband   Theraband Level (Shoulder Horizontal ABduction) Level 2 (Red)   External Rotation Strengthening;Both;20 reps;Theraband   Theraband Level (Shoulder External Rotation) Level 2 (Red)   Flexion Strengthening;20 reps;Theraband  overhead pull   Theraband Level (Shoulder Flexion) Level 2 (Red)   Other Supine Exercises D1 flexion each side, red band x 20 reps     Shoulder Exercises: Sidelying   External Rotation Strengthening;Right;Weights  3x10   External Rotation Weight (lbs) 1     Shoulder Exercises: ROM/Strengthening   UBE (Upper Arm Bike) L1 x 4' alt FWD/BWD     Shoulder Exercises: Stretch   Other Shoulder Stretches supine snow angel stretch with arms abducted ~ 60 degrees.      Modalities   Modalities Moist Heat     Moist Heat Therapy   Number Minutes Moist Heat 10 Minutes   Moist Heat Location Shoulder  Lt     Manual Therapy   Manual Therapy Soft tissue mobilization   Soft tissue mobilization manual work to Lt forearm, trigger points palpable and Lt pecs with gentle AP GH mobs.                      PT Long Term Goals - 01/15/17  [place] #1   Title I with advanced HEP (02/21/17)    Status On-going     PT LONG TERM GOAL #2   Title improve Lt shoulder ROM to within 10 degrees of Rt to increase ease of dressing ( 02/21/17)    Status On-going     PT LONG TERM GOAL #3   Title increase strength Lt shoulder =/> 5-/5 to assist with daily activities ( 02/21/17)    Status On-going     PT LONG TERM GOAL #4   Title improve FOTO =/< 36% limited, CJ level ( 02/21/17)    Status On-going     PT LONG TERM GOAL #5   Title report decrease Lt shoulder pain to allow her to sleep per her previous level ( 02/21/17)    Status On-going               Plan - 01/15/17 1639    Clinical Impression Statement This is Kimberly Jackson's second visit, she has been having pain with the stretches so these were taken out of her HEP. She tolerated supine pec stretches well.  She has rounded shoulders and weakness through her upper back.    Rehab  Potential Excellent   PT Frequency 2x / week   PT Duration 6 weeks   PT Treatment/Interventions Moist Heat;Ultrasound;Therapeutic exercise;Dry needling;Taping;Vasopneumatic Device;Manual techniques;Neuromuscular re-education;Cryotherapy;Electrical Stimulation;Iontophoresis 4mg /ml Dexamethasone;Patient/family education   PT Next Visit Plan progress HEP    Consulted and Agree with Plan of Care Patient      Patient will benefit from skilled therapeutic intervention in order to improve the following deficits and impairments:  Decreased range of motion, Impaired UE functional use, Increased muscle spasms, Pain, Decreased strength  Visit Diagnosis: Stiffness of left shoulder, not elsewhere classified  Abnormal posture  Muscle weakness (generalized)  Chronic left shoulder pain     Problem List Patient Active Problem List   Diagnosis Date Noted  . Left shoulder pain 12/09/2016  . OSA and COPD overlap syndrome ([place]) 07/05/2016  . Right foot pain 12/07/2015  . Vitamin D deficiency 09/11/2015  . Prediabetes 09/08/2015  . Osteopenia 09/08/2015  . Ventral hernia 09/08/2015  . COPD (chronic obstructive pulmonary disease) ([place]) 09/04/2015  . Paroxysmal A-fib ([place]) 09/04/2015  . HTN (hypertension) 09/04/2015  . Long-term use of high-risk medication 09/04/2015    Jeral Pinch PT  01/15/2017, 4:41 PM  [facility] [place], [place], [address] Phone: [phone]   Fax:  [phone]  Name: Kimberly Jackson MRN: [id number] Date of Birth: 1945-10-05

## 2017-01-17 ENCOUNTER — Ambulatory Visit (INDEPENDENT_AMBULATORY_CARE_PROVIDER_SITE_OTHER): Payer: Medicare Other | Admitting: Rehabilitative and Restorative Service Providers"

## 2017-01-17 ENCOUNTER — Encounter: Payer: Self-pay | Admitting: Rehabilitative and Restorative Service Providers"

## 2017-01-17 DIAGNOSIS — G8929 Other chronic pain: Secondary | ICD-10-CM | POA: Diagnosis not present

## 2017-01-17 DIAGNOSIS — M6281 Muscle weakness (generalized): Secondary | ICD-10-CM

## 2017-01-17 DIAGNOSIS — M25512 Pain in left shoulder: Secondary | ICD-10-CM

## 2017-01-17 DIAGNOSIS — M25612 Stiffness of left shoulder, not elsewhere classified: Secondary | ICD-10-CM | POA: Diagnosis present

## 2017-01-17 DIAGNOSIS — R293 Abnormal posture: Secondary | ICD-10-CM

## 2017-01-17 NOTE — Patient Instructions (Addendum)
Shoulder Blade Squeeze    Rotate shoulders back, then squeeze shoulder blades down and back. Hold 10 sec Repeat _10___ times. Do __several __ sessions per day.    Scapular Retraction: Elbow Flexion (Standing)    With elbows bent to 90, pinch shoulder blades together and rotate arms out, keeping elbows bent. Repeat __10__ times per set. Do __1-2__ sets per session. Do __several__ sessions per day.

## 2017-01-17 NOTE — Therapy (Signed)
[facility] [place], [place], [address] Phone: [phone]   Fax:  [phone]  Physical Therapy Treatment  Patient Details  Name: Kimberly Jackson MRN: [id number] Date of Birth: Dec 14, 1945 Referring Provider: Dr Steva Colder  Encounter Date: 01/17/2017      PT End of Session - 01/17/17 1332    Visit Number 3   Number of Visits 12   Date for PT Re-Evaluation 02/21/17   PT Start Time 1331   PT Stop Time 1418   PT Time Calculation (min) 47 min   Activity Tolerance Patient tolerated treatment well      Past Medical History:  Diagnosis Date  . Alcohol abuse   . COPD (chronic obstructive pulmonary disease) ([place])   . Depression   . Reflux     Past Surgical History:  Procedure Laterality Date  . ABDOMINAL HYSTERECTOMY    . APPENDECTOMY     71 yo  . BLADDER SURGERY  2015  . CHOLECYSTECTOMY    . REPLACEMENT TOTAL KNEE    . ROTATOR CUFF REPAIR    . TONSILLECTOMY      There were no vitals filed for this visit.      Subjective Assessment - 01/17/17 1333    Subjective Kimberly Jackson reports that the Lt shoulder has felt some better since the last visit. She continues to have paiin in the Lt shoulder with dressing.    Currently in Pain? Yes   Pain Score 1    Pain Location Shoulder   Pain Orientation Left   Pain Descriptors / Indicators Dull   Pain Type Chronic pain   Pain Onset More than a month ago   Pain Frequency Intermittent                         [facility] Adult PT Treatment/Exercise - 01/17/17 0001      Shoulder Exercises: Standing   Other Standing Exercises scap squeeze 10 sec x 10 with noodle    Other Standing Exercises W's with noodle x 10      Shoulder Exercises: Pulleys   Flexion --  10 sec x 10 reps      Shoulder Exercises: Stretch   Other Shoulder Stretches supine snow angel stretch with arms abducted ~ 60 degrees.      Modalities   Modalities Moist Heat     Moist Heat  Therapy   Number Minutes Moist Heat 15 Minutes   Moist Heat Location Shoulder  Lt     Manual Therapy   Manual Therapy Soft tissue mobilization   Manual therapy comments pt supine    Joint Mobilization Grade II mobs Lt GH joint with circumduction of joint for capsular stretching    Soft tissue mobilization upper trap; leveator; medial scapular border; teres/lats; pecs    Scapular Mobilization Lt scap protraction and depression - moving along thoracic wall    Passive ROM Lt shoulder flexion; abduction; ER; IR                 PT Education - 01/17/17 1358    Education provided Yes   Education Details HEP   Person(s) Educated Patient   Methods Explanation;Demonstration;Tactile cues;Verbal cues;Handout   Comprehension Verbalized understanding;Returned demonstration;Verbal cues required;Tactile cues required             PT Long Term Goals - 01/15/17 1609      PT LONG TERM GOAL #1   Title I with advanced  HEP (02/21/17)    Status On-going     PT LONG TERM GOAL #2   Title improve Lt shoulder ROM to within 10 degrees of Rt to increase ease of dressing ( 02/21/17)    Status On-going     PT LONG TERM GOAL #3   Title increase strength Lt shoulder =/> 5-/5 to assist with daily activities ( 02/21/17)    Status On-going     PT LONG TERM GOAL #4   Title improve FOTO =/< 36% limited, CJ level ( 02/21/17)    Status On-going     PT LONG TERM GOAL #5   Title report decrease Lt shoulder pain to allow her to sleep per her previous level ( 02/21/17)    Status On-going               Plan - 01/17/17 1553    Clinical Impression Statement Shoulder is improving with less pain today. Patient tolerated all exercise and manual work including PROM without difficulty. She will be out of town next week but will continue with her HEP and return to PT the following week. Progressing gradually toward stated goals of therapy.    Rehab Potential Excellent   PT Frequency 2x / week   PT Duration 6  weeks   PT Treatment/Interventions Moist Heat;Ultrasound;Therapeutic exercise;Dry needling;Taping;Vasopneumatic Device;Manual techniques;Neuromuscular re-education;Cryotherapy;Electrical Stimulation;Iontophoresis 4mg /ml Dexamethasone;Patient/family education   PT Next Visit Plan continue joint mobs; PROM; stretching to tolerance; posterior shoulder girdle strengthening; manual work and modalities as indicated    Consulted and Agree with Plan of Care Patient      Patient will benefit from skilled therapeutic intervention in order to improve the following deficits and impairments:  Decreased range of motion, Impaired UE functional use, Increased muscle spasms, Pain, Decreased strength  Visit Diagnosis: Stiffness of left shoulder, not elsewhere classified  Abnormal posture  Muscle weakness (generalized)  Chronic left shoulder pain     Problem List Patient Active Problem List   Diagnosis Date Noted  . Left shoulder pain 12/09/2016  . OSA and COPD overlap syndrome ([place]) 07/05/2016  . Right foot pain 12/07/2015  . Vitamin D deficiency 09/11/2015  . Prediabetes 09/08/2015  . Osteopenia 09/08/2015  . Ventral hernia 09/08/2015  . COPD (chronic obstructive pulmonary disease) ([place]) 09/04/2015  . Paroxysmal A-fib ([place]) 09/04/2015  . HTN (hypertension) 09/04/2015  . Long-term use of high-risk medication 09/04/2015    Kimberly Jackson PT, MPH  01/17/2017, 3:57 PM  [facility] [place], [place], [address] Phone: [phone]   Fax:  [phone]  Name: Kimberly Jackson MRN: [id number] Date of Birth: 20-Jul-1946

## 2017-01-27 ENCOUNTER — Ambulatory Visit (INDEPENDENT_AMBULATORY_CARE_PROVIDER_SITE_OTHER): Payer: Medicare Other | Admitting: Rehabilitative and Restorative Service Providers"

## 2017-01-27 ENCOUNTER — Encounter: Payer: Self-pay | Admitting: Rehabilitative and Restorative Service Providers"

## 2017-01-27 DIAGNOSIS — R293 Abnormal posture: Secondary | ICD-10-CM | POA: Diagnosis not present

## 2017-01-27 DIAGNOSIS — G8929 Other chronic pain: Secondary | ICD-10-CM

## 2017-01-27 DIAGNOSIS — M25612 Stiffness of left shoulder, not elsewhere classified: Secondary | ICD-10-CM | POA: Diagnosis not present

## 2017-01-27 DIAGNOSIS — M25512 Pain in left shoulder: Secondary | ICD-10-CM

## 2017-01-27 DIAGNOSIS — M6281 Muscle weakness (generalized): Secondary | ICD-10-CM | POA: Diagnosis not present

## 2017-01-27 NOTE — Therapy (Signed)
[place] [facility], [place], [address] Phone: [phone]   Fax:  [phone]  Physical Therapy Treatment  Patient Details  Name: Kimberly Jackson MRN: [id number] Date of Birth: Dec 07, 1945 Referring Provider: Dr Lynne Leader   Encounter Date: 01/27/2017      PT End of Session - 01/27/17 1107    Visit Number 4   Number of Visits 12   Date for PT Re-Evaluation 02/21/17   PT Start Time 1103   PT Stop Time 1157   PT Time Calculation (min) 54 min   Activity Tolerance Patient tolerated treatment well      Past Medical History:  Diagnosis Date  . Alcohol abuse   . COPD (chronic obstructive pulmonary disease) ([place])   . Depression   . Reflux     Past Surgical History:  Procedure Laterality Date  . ABDOMINAL HYSTERECTOMY    . APPENDECTOMY     71 yo  . BLADDER SURGERY  2015  . CHOLECYSTECTOMY    . REPLACEMENT TOTAL KNEE    . ROTATOR CUFF REPAIR    . TONSILLECTOMY      There were no vitals filed for this visit.      Subjective Assessment - 01/27/17 1108    Subjective Kimberly Jackson reports that her shoulder feels bad - not worse than when she was first here but just as bad. She was on vacation and in the car a lot. That may have irritated the shoulder. Worst is at night when she is trying to sleep pain is 8/10 and she has pain at 8/10 sometimes when she is using the arm for dressing and reaching    Currently in Pain? Yes   Pain Score 2    Pain Orientation Left   Pain Descriptors / Indicators Dull;Aching   Pain Type Chronic pain   Pain Onset More than a month ago   Pain Frequency Intermittent            [facility] PT Assessment - 01/27/17 0001      Assessment   Medical Diagnosis chronic Lt shoulder pain   Referring Provider Dr Lynne Leader    Onset Date/Surgical Date 09/12/16   Hand Dominance Right   Next MD Visit 02/14/17   Prior Therapy not for Lt shoulder     AROM   Right/Left Shoulder Right;Left  pain  wit hall motions Lt shoulder   Right Shoulder Extension 47 Degrees   Right Shoulder Flexion 157 Degrees   Right Shoulder ABduction 140 Degrees   Right Shoulder Internal Rotation 58 Degrees  seated arm 90 degrees scaption   Right Shoulder External Rotation 85 Degrees  seated arm 90 degrees scaption   Left Shoulder Extension 42 Degrees   Left Shoulder Flexion 124 Degrees   Left Shoulder ABduction 126 Degrees   Left Shoulder Internal Rotation 25 Degrees  seated arm 90 degree scaption   Left Shoulder External Rotation 68 Degrees  arm 90 degrees scaption     Palpation   Palpation comment tightness Lt pecs; upper trap; teres; medial scapular musculature                     [facility] Adult PT Treatment/Exercise - 01/27/17 0001      Shoulder Exercises: Standing   Extension Strengthening;Both;15 reps;Theraband   Theraband Level (Shoulder Extension) Level 1 (Yellow)   Extension Limitations AAROM extension with cane x 5    Row Strengthening;Both;15 reps;Theraband   Theraband Level (Shoulder Row)  Level 1 (Yellow)   Retraction Strengthening;15 reps;Theraband   Theraband Level (Shoulder Retraction) Level 1 (Yellow)   Other Standing Exercises scap squeeze 10 sec x 10 with noodle    Other Standing Exercises W's with noodle x 10      Shoulder Exercises: Pulleys   Flexion --  10 sec x 10 reps    ABduction --  10 sec x 10      Shoulder Exercises: Stretch   Internal Rotation Stretch 5 reps  20 sec hold with strap    External Rotation Stretch --  10 sec x 10 reps standing w/noodle using cane elbow 90 deg   Other Shoulder Stretches supine snow angel stretch with arms abducted ~ 60 degrees.      Modalities   Modalities Moist Heat     Moist Heat Therapy   Number Minutes Moist Heat 15 Minutes   Moist Heat Location Shoulder  Lt     Manual Therapy   Manual Therapy Soft tissue mobilization   Manual therapy comments pt supine    Joint Mobilization Grade II mobs Lt GH joint with  circumduction of joint for capsular stretching    Soft tissue mobilization upper trap; leveator; medial scapular border; teres/lats; pecs    Scapular Mobilization Lt scap protraction and depression - moving along thoracic wall    Passive ROM Lt shoulder flexion; abduction; ER; IR                 PT Education - 01/27/17 1130    Education provided Yes   Education Details HEP    Person(s) Educated Patient   Methods Explanation;Demonstration;Tactile cues;Verbal cues;Handout   Comprehension Verbalized understanding;Returned demonstration;Verbal cues required;Tactile cues required             PT Long Term Goals - 01/27/17 1145      PT LONG TERM GOAL #1   Title I with advanced HEP (02/21/17)    Time 6   Period Weeks   Status On-going     PT LONG TERM GOAL #2   Title improve Lt shoulder ROM to within 10 degrees of Rt to increase ease of dressing ( 02/21/17)    Time 6   Period Weeks   Status On-going     PT LONG TERM GOAL #3   Title increase strength Lt shoulder =/> 5-/5 to assist with daily activities ( 02/21/17)    Time 6   Period Weeks   Status On-going     PT LONG TERM GOAL #4   Title improve FOTO =/< 36% limited, CJ level ( 02/21/17)    Time 6   Period Weeks   Status On-going     PT LONG TERM GOAL #5   Title report decrease Lt shoulder pain to allow her to sleep per her previous level ( 02/21/17)    Time 6   Period Weeks   Status On-going               Plan - 01/27/17 1113    Clinical Impression Statement Persistent pain in Lt shoulder. Limited ROM and function. Poor posture and alignment. Continued muscular tightness and joint limitations. No goals accomplished. Hope for better resutls with consistent therapy and HEP    Rehab Potential Good   PT Frequency 2x / week   PT Duration 6 weeks   PT Treatment/Interventions Moist Heat;Ultrasound;Therapeutic exercise;Dry needling;Taping;Vasopneumatic Device;Manual techniques;Neuromuscular  re-education;Cryotherapy;Electrical Stimulation;Iontophoresis 4mg /ml Dexamethasone;Patient/family education   PT Next Visit Plan continue joint mobs; PROM; stretching to tolerance;  posterior shoulder girdle strengthening; manual work and modalities as indicated Focus on AAROM-PROM all planes with manual work and passive stretching    Consulted and Agree with Plan of Care Patient      Patient will benefit from skilled therapeutic intervention in order to improve the following deficits and impairments:  Decreased range of motion, Impaired UE functional use, Increased muscle spasms, Pain, Decreased strength  Visit Diagnosis: Stiffness of left shoulder, not elsewhere classified  Abnormal posture  Muscle weakness (generalized)  Chronic left shoulder pain     Problem List Patient Active Problem List   Diagnosis Date Noted  . Left shoulder pain 12/09/2016  . OSA and COPD overlap syndrome ([place]) 07/05/2016  . Right foot pain 12/07/2015  . Vitamin D deficiency 09/11/2015  . Prediabetes 09/08/2015  . Osteopenia 09/08/2015  . Ventral hernia 09/08/2015  . COPD (chronic obstructive pulmonary disease) ([place]) 09/04/2015  . Paroxysmal A-fib ([place]) 09/04/2015  . HTN (hypertension) 09/04/2015  . Long-term use of high-risk medication 09/04/2015    Kimberly Jackson PT, MPH  01/27/2017, 1:10 PM  [facility] [place], [place], [address] Phone: [phone]   Fax:  [phone]  Name: Kimberly Jackson MRN: [id number] Date of Birth: Jan 13, 1946

## 2017-01-27 NOTE — Patient Instructions (Addendum)
Resisted External Rotation: in Neutral - Bilateral   PALMS UP Sit or stand, tubing in both hands, elbows at sides, bent to 90, forearms forward. Pinch shoulder blades together and rotate forearms out. Keep elbows at sides. Repeat __10__ times per set. Do _2-3___ sets per session. Do _2-3___ sessions per day.   Low Row: Standing   Face anchor, feet shoulder width apart. Palms up, pull arms back, squeezing shoulder blades together. Repeat 10__ times per set. Do 2-3__ sets per session. Do 2-3__ sessions per day Anchor Height: Waist     Strengthening: Resisted Extension   Hold tubing in right hand, arm forward. Pull arm back, elbow straight. Repeat _10___ times per set. Do 2-3____ sets per session. Do 2-3____ sessions per day.  External Rotator Cuff Stretch, Supine (Passive)    Standing one elbow against ribs, and bent at 90, dowel in palm, other hand holding dowel up. Use other arm to push forearm toward floor. Keep elbow against side. Hold __10_ seconds. Repeat __10_ times per session. Do _2-3__ sessions per day.   External Rotator Cuff Stretch, Standing    Stand holding club behind body, one arm above head, other arm bent behind back. With lower hand, pull gently downward. Hold __20_ seconds. Repeat ___ times per session. Do _3__ sessions per day. 5

## 2017-01-30 ENCOUNTER — Ambulatory Visit (INDEPENDENT_AMBULATORY_CARE_PROVIDER_SITE_OTHER): Payer: Medicare Other | Admitting: Rehabilitative and Restorative Service Providers"

## 2017-01-30 ENCOUNTER — Encounter: Payer: Self-pay | Admitting: Rehabilitative and Restorative Service Providers"

## 2017-01-30 DIAGNOSIS — R293 Abnormal posture: Secondary | ICD-10-CM

## 2017-01-30 DIAGNOSIS — M6281 Muscle weakness (generalized): Secondary | ICD-10-CM | POA: Diagnosis not present

## 2017-01-30 DIAGNOSIS — G8929 Other chronic pain: Secondary | ICD-10-CM | POA: Diagnosis not present

## 2017-01-30 DIAGNOSIS — M25612 Stiffness of left shoulder, not elsewhere classified: Secondary | ICD-10-CM | POA: Diagnosis not present

## 2017-01-30 DIAGNOSIS — M25512 Pain in left shoulder: Secondary | ICD-10-CM | POA: Diagnosis not present

## 2017-01-30 NOTE — Therapy (Signed)
[facility] [place], [place], [address] Phone: [phone]   Fax:  [phone]  Physical Therapy Treatment  Patient Details  Name: Kimberly Jackson MRN: [id number] Date of Birth: 1946/05/09 Referring Provider: Dr Lynne Leader   Encounter Date: 01/30/2017      PT End of Session - 01/30/17 1055    Visit Number 5   Number of Visits 12   Date for PT Re-Evaluation 02/21/17   PT Start Time 1055   PT Stop Time 1149   PT Time Calculation (min) 54 min   Activity Tolerance Patient tolerated treatment well      Past Medical History:  Diagnosis Date  . Alcohol abuse   . COPD (chronic obstructive pulmonary disease) ([place])   . Depression   . Reflux     Past Surgical History:  Procedure Laterality Date  . ABDOMINAL HYSTERECTOMY    . APPENDECTOMY     71 yo  . BLADDER SURGERY  2015  . CHOLECYSTECTOMY    . REPLACEMENT TOTAL KNEE    . ROTATOR CUFF REPAIR    . TONSILLECTOMY      There were no vitals filed for this visit.      Subjective Assessment - 01/30/17 1056    Subjective Kimberly Jackson reports that her shoudler is feeling a little better. Wants her husband Kimberly Jackson to observe therapy so he can help her at home. Kimberly Jackson states that he will be able to rig [place] up a pulley for ROM at home.  Has had less "catches" and even resting better with lsess pain at night.    Currently in Pain? No/denies                         [facility] Adult PT Treatment/Exercise - 01/30/17 0001      Shoulder Exercises: Standing   Extension Strengthening;Both;15 reps;Theraband   Theraband Level (Shoulder Extension) Level 1 (Yellow)   Extension Limitations AAROM extension with cane x 5    Row Strengthening;Both;15 reps;Theraband   Theraband Level (Shoulder Row) Level 1 (Yellow)   Retraction Strengthening;15 reps;Theraband   Theraband Level (Shoulder Retraction) Level 1 (Yellow)   Other Standing Exercises scap squeeze 10 sec x 10 with  noodle    Other Standing Exercises W's with noodle x 10      Shoulder Exercises: Pulleys   Flexion --  10 sec x 10 reps    ABduction --  10 sec x 10      Shoulder Exercises: Stretch   Internal Rotation Stretch 5 reps  20 sec hold with strap    External Rotation Stretch --  10 sec x 10 reps standing w/noodle using cane elbow 90 deg   Other Shoulder Stretches supine snow angel stretch with arms abducted ~ 60 degrees.    Other Shoulder Stretches doorway ER Lt only turning feet to Rt slowly 30 sec x 3; hands behind head in supine for ER stretch 30 sec x 3      Modalities   Modalities Moist Heat     Moist Heat Therapy   Number Minutes Moist Heat 15 Minutes   Moist Heat Location Shoulder  Lt     Manual Therapy   Manual Therapy Soft tissue mobilization   Manual therapy comments pt supine    Joint Mobilization Grade II mobs Lt GH joint with circumduction of joint for capsular stretching    Soft tissue mobilization upper trap; leveator; medial scapular border; teres/lats;  pecs    Scapular Mobilization Lt scap protraction and depression - moving along thoracic wall    Passive ROM Lt shoulder flexion; abduction; ER; IR; extension; horizontal abductoin                  PT Education - 01/30/17 1118    Education provided Yes   Education Details HEP   Person(s) Educated Patient   Methods Explanation;Demonstration;Tactile cues;Verbal cues;Handout   Comprehension Verbalized understanding;Returned demonstration;Verbal cues required;Tactile cues required             PT Long Term Goals - 01/27/17 1145      PT LONG TERM GOAL #1   Title I with advanced HEP (02/21/17)    Time 6   Period Weeks   Status On-going     PT LONG TERM GOAL #2   Title improve Lt shoulder ROM to within 10 degrees of Rt to increase ease of dressing ( 02/21/17)    Time 6   Period Weeks   Status On-going     PT LONG TERM GOAL #3   Title increase strength Lt shoulder =/> 5-/5 to assist with daily  activities ( 02/21/17)    Time 6   Period Weeks   Status On-going     PT LONG TERM GOAL #4   Title improve FOTO =/< 36% limited, CJ level ( 02/21/17)    Time 6   Period Weeks   Status On-going     PT LONG TERM GOAL #5   Title report decrease Lt shoulder pain to allow her to sleep per her previous level ( 02/21/17)    Time 6   Period Weeks   Status On-going               Plan - 01/30/17 1100    Clinical Impression Statement Good response to last treatment with decreased pain. She is sleeping better and is pleased with her progress. progressing toward stated goals of therapy.    Rehab Potential Good   PT Frequency 2x / week   PT Duration 6 weeks   PT Treatment/Interventions Moist Heat;Ultrasound;Therapeutic exercise;Dry needling;Taping;Vasopneumatic Device;Manual techniques;Neuromuscular re-education;Cryotherapy;Electrical Stimulation;Iontophoresis 4mg /ml Dexamethasone;Patient/family education   PT Next Visit Plan continue joint mobs; PROM; stretching to tolerance; posterior shoulder girdle strengthening; manual work and modalities as indicated Focus on AAROM-PROM all planes with manual work and passive stretching    Consulted and Agree with Plan of Care Patient      Patient will benefit from skilled therapeutic intervention in order to improve the following deficits and impairments:  Decreased range of motion, Impaired UE functional use, Increased muscle spasms, Pain, Decreased strength  Visit Diagnosis: Stiffness of left shoulder, not elsewhere classified  Abnormal posture  Muscle weakness (generalized)  Chronic left shoulder pain     Problem List Patient Active Problem List   Diagnosis Date Noted  . Left shoulder pain 12/09/2016  . OSA and COPD overlap syndrome ([place]) 07/05/2016  . Right foot pain 12/07/2015  . Vitamin D deficiency 09/11/2015  . Prediabetes 09/08/2015  . Osteopenia 09/08/2015  . Ventral hernia 09/08/2015  . COPD (chronic obstructive pulmonary  disease) ([place]) 09/04/2015  . Paroxysmal A-fib ([place]) 09/04/2015  . HTN (hypertension) 09/04/2015  . Long-term use of high-risk medication 09/04/2015    Marquin Patino Nilda Simmer PT, MPH  01/30/2017, 11:40 AM  [facility] [place], [place], [address] Phone: [phone]   Fax:  [phone]  Name: Lei Dower MRN: [id number]  Date of Birth: 1945-11-08

## 2017-01-30 NOTE — Patient Instructions (Addendum)
Strengthening: Resisted Extension   Hold tubing in right hand, arm forward. Pull arm back, elbow straight. Repeat _10___ times per set. Do 2-3____ sets per session. Do 2-3____ sessions per day.  External Rotator Cuff Stretch, Standing    Stand, palm against door frame and elbow bent at 90. Turn body away from fixed hand allowing shoulder to come forward. Hold __30_ seconds.  Repeat _3__ times per session. Do __2-3_ sessions per day.    External Rotator Cuff Stretch, Supine    Lie supine, fingers clasped behind head, elbows close together. Pull elbows backward while pinching shoulder blades. Hold __30-60_ seconds. Repeat _3__ times per session. Do __1-2_ sessions per day.

## 2017-02-06 ENCOUNTER — Other Ambulatory Visit: Payer: Self-pay | Admitting: *Deleted

## 2017-02-06 ENCOUNTER — Encounter: Payer: Self-pay | Admitting: Rehabilitative and Restorative Service Providers"

## 2017-02-06 ENCOUNTER — Ambulatory Visit (INDEPENDENT_AMBULATORY_CARE_PROVIDER_SITE_OTHER): Payer: Medicare Other | Admitting: Rehabilitative and Restorative Service Providers"

## 2017-02-06 DIAGNOSIS — M25612 Stiffness of left shoulder, not elsewhere classified: Secondary | ICD-10-CM

## 2017-02-06 DIAGNOSIS — M25512 Pain in left shoulder: Secondary | ICD-10-CM | POA: Diagnosis not present

## 2017-02-06 DIAGNOSIS — R293 Abnormal posture: Secondary | ICD-10-CM | POA: Diagnosis not present

## 2017-02-06 DIAGNOSIS — G8929 Other chronic pain: Secondary | ICD-10-CM | POA: Diagnosis not present

## 2017-02-06 DIAGNOSIS — M6281 Muscle weakness (generalized): Secondary | ICD-10-CM

## 2017-02-06 MED ORDER — METOPROLOL SUCCINATE ER 100 MG PO TB24: 100.0000 mg | ORAL_TABLET | Freq: Every day | ORAL | 1 refills | Status: DC

## 2017-02-06 NOTE — Therapy (Signed)
[place], [place], [address] Phone: [phone]   Fax:  [phone]  Physical Therapy Treatment  Patient Details  Name: Kimberly Jackson MRN: [id number] Date of Birth: Sep 05, 1945 Referring Provider: Dr Lynne Leader   Encounter Date: 02/06/2017      PT End of Session - 02/06/17 0914    Visit Number 6   Number of Visits 12   Date for PT Re-Evaluation 02/21/17   PT Start Time 0922   PT Stop Time 1020   PT Time Calculation (min) 58 min   Activity Tolerance Patient tolerated treatment well      Past Medical History:  Diagnosis Date  . Alcohol abuse   . COPD (chronic obstructive pulmonary disease) ([place])   . Depression   . Reflux     Past Surgical History:  Procedure Laterality Date  . ABDOMINAL HYSTERECTOMY    . APPENDECTOMY     71 yo  . BLADDER SURGERY  2015  . CHOLECYSTECTOMY    . REPLACEMENT TOTAL KNEE    . ROTATOR CUFF REPAIR    . TONSILLECTOMY      There were no vitals filed for this visit.      Subjective Assessment - 02/06/17 0921    Subjective Zigmund Jackson reports that her Lt shoulder is less painful. She is sleeping some better. Her husband hooked her up a pulley for home and she has been using the pulley daily. She is continuing to work on her HEP.    Currently in Pain? No/denies            [facility] PT Assessment - 02/06/17 0001      Assessment   Medical Diagnosis chronic Lt shoulder pain   Referring Provider Dr Lynne Leader    Onset Date/Surgical Date 09/12/16   Hand Dominance Right   Next MD Visit 02/14/17   Prior Therapy not for Lt shoulder     AROM   Left Shoulder Extension 55 Degrees   Left Shoulder Flexion 130 Degrees   Left Shoulder ABduction 133 Degrees   Left Shoulder Internal Rotation 28 Degrees  seated arm 90 degree scaption   Left Shoulder External Rotation 80 Degrees  arm 90 degrees scaption     Strength   Left Shoulder Extension 5/5   Left Shoulder ABduction  4+/5   Left Shoulder Internal Rotation 5/5   Left Shoulder External Rotation 5/5                     [facility] Adult PT Treatment/Exercise - 02/06/17 0001      Shoulder Exercises: Standing   Extension Strengthening;Both;15 reps;Theraband   Theraband Level (Shoulder Extension) Level 2 (Red)   Extension Limitations AAROM extension with cane x 5    Row Strengthening;Both;15 reps;Theraband   Theraband Level (Shoulder Row) Level 2 (Red)   Row Limitations added step back with red TB x 10    Retraction Strengthening;15 reps;Theraband   Theraband Level (Shoulder Retraction) Level 1 (Yellow)   Other Standing Exercises scap squeeze 10 sec x 10 with noodle    Other Standing Exercises W's with noodle x 10      Shoulder Exercises: Pulleys   Flexion --  10 sec x 10 reps    ABduction --  10 sec x 10      Shoulder Exercises: Therapy Ball   Flexion 10 reps  rolling out with large green ball    Flexion Limitations rolling ball up wall for  flexion stretch x 5    Other Therapy Ball Exercises scapular depression pressing into ball 5 sec x 10 reps x 2 sets      Shoulder Exercises: Stretch   Internal Rotation Stretch 5 reps  20 sec hold with strap    External Rotation Stretch --  10 sec x 10 reps standing w/noodle using cane elbow 90 deg   Other Shoulder Stretches supine snow angel stretch with arms abducted ~ 70 degrees.    Other Shoulder Stretches doorway stretcy lower and mid positions bilat UE's 30 sec x 3 - pt to gradually progress height of UE's      Modalities   Modalities Moist Heat     Moist Heat Therapy   Number Minutes Moist Heat 15 Minutes   Moist Heat Location Shoulder  Lt     Manual Therapy   Manual Therapy Soft tissue mobilization   Manual therapy comments pt supine    Joint Mobilization Grade II mobs Lt GH joint with circumduction of joint for capsular stretching    Soft tissue mobilization upper trap; leveator; medial scapular border; teres/lats; pecs     Scapular Mobilization Lt scap protraction and depression - moving along thoracic wall    Passive ROM Lt shoulder flexion; abduction; ER; IR; extension; horizontal abductoin                  PT Education - 02/06/17 0957    Education provided Yes   Education Details HEP    Person(s) Educated Patient   Methods Explanation;Demonstration;Tactile cues;Verbal cues;Handout   Comprehension Verbalized understanding;Returned demonstration;Verbal cues required;Tactile cues required             PT Long Term Goals - 02/06/17 0914      PT LONG TERM GOAL #1   Title I with advanced HEP (02/21/17)    Time 6   Period Weeks   Status On-going     PT LONG TERM GOAL #2   Title improve Lt shoulder ROM to within 10 degrees of Rt to increase ease of dressing ( 02/21/17)    Time 6   Period Weeks   Status On-going     PT LONG TERM GOAL #3   Title increase strength Lt shoulder =/> 5-/5 to assist with daily activities ( 02/21/17)    Time 6   Period Weeks   Status Partially Met     PT LONG TERM GOAL #4   Title improve FOTO =/< 36% limited, CJ level ( 02/21/17)    Time 6   Period Weeks   Status On-going     PT LONG TERM GOAL #5   Title report decrease Lt shoulder pain to allow her to sleep per her previous level ( 02/21/17)    Time 6   Period Weeks   Status On-going               Plan - 02/06/17 1009    Clinical Impression Statement Patient reports good inmprovement in pain and increase in mobility and function in Lt UE. She demonstrates increase in AROM and strength. Pain is improved and she is sleeping well at night. Zigmund Jackson is progressing well toward stated goals of therapy. She will be out of town for the next few weeks but has scheduled additional appointments for her return.    Rehab Potential Good   PT Frequency 2x / week   PT Duration 6 weeks   PT Treatment/Interventions Moist Heat;Ultrasound;Therapeutic exercise;Dry needling;Taping;Vasopneumatic Device;Manual  techniques;Neuromuscular re-education;Cryotherapy;Electrical Stimulation;Iontophoresis 50m/ml  Dexamethasone;Patient/family education   PT Next Visit Plan continue joint mobs; PROM; stretching to tolerance; posterior shoulder girdle strengthening; manual work and modalities as indicated Focus on AAROM-PROM all planes with manual work and passive stretching; continue Radiation protection practitioner and Agree with Plan of Care Patient      Patient will benefit from skilled therapeutic intervention in order to improve the following deficits and impairments:  Decreased range of motion, Impaired UE functional use, Increased muscle spasms, Pain, Decreased strength, Postural dysfunction, Improper body mechanics  Visit Diagnosis: Stiffness of left shoulder, not elsewhere classified  Abnormal posture  Muscle weakness (generalized)  Chronic left shoulder pain     Problem List Patient Active Problem List   Diagnosis Date Noted  . Left shoulder pain 12/09/2016  . OSA and COPD overlap syndrome ([place]) 07/05/2016  . Right foot pain 12/07/2015  . Vitamin D deficiency 09/11/2015  . Prediabetes 09/08/2015  . Osteopenia 09/08/2015  . Ventral hernia 09/08/2015  . COPD (chronic obstructive pulmonary disease) ([place]) 09/04/2015  . Paroxysmal A-fib ([place]) 09/04/2015  . HTN (hypertension) 09/04/2015  . Long-term use of high-risk medication 09/04/2015    Jimeka Balan Nilda Simmer PT, MPH  02/06/2017, 10:12 AM  [facility] [place], [place], [address] Phone: [phone]   Fax:  [phone]  Name: Larue Drawdy MRN: [id number] Date of Birth: 1945-10-15

## 2017-02-06 NOTE — Patient Instructions (Addendum)
SUPINE Tips A    Being in the supine position means to be lying on the back. Lying on the back is the position of least compression on the bones and discs of the spine, and helps to re-align the natural curves of the back.   Stepping under beach ball with arms up straight Hold 30 sec x 5 - 10 reps   Press down on the ball at your side in sitting 5 sec hold x 10 for 2 sets   Scapula Adduction With Pectoralis Stretch: Low - Standing   Shoulders at 45 hands even with shoulders, keeping weight through legs, shift weight forward until you feel pull or stretch through the front of your chest. Hold _30__ seconds. Do _3__ times, _2-4__ times per day.   Scapula Adduction With Pectoralis Stretch: Mid-Range - Standing   Shoulders at 90 elbows even with shoulders, keeping weight through legs, shift weight forward until you feel pull or strength through the front of your chest. Hold __30_ seconds. Do _3__ times, __2-4_ times per day.   Scapula Adduction With Pectoralis Stretch: High - Standing   Shoulders at 120 hands up high on the doorway, keeping weight on feet, shift weight forward until you feel pull or stretch through the front of your chest. Hold _30__ seconds. Do _3__ times, _2-3__ times per day.  Step back with theraband - like shooting bow and arrow

## 2017-02-10 ENCOUNTER — Encounter (HOSPITAL_BASED_OUTPATIENT_CLINIC_OR_DEPARTMENT_OTHER): Payer: Medicare Other

## 2017-02-11 ENCOUNTER — Encounter: Payer: Medicare Other | Admitting: Rehabilitative and Restorative Service Providers"

## 2017-02-14 ENCOUNTER — Ambulatory Visit: Payer: Medicare Other | Admitting: Family Medicine

## 2017-02-14 ENCOUNTER — Encounter: Payer: Medicare Other | Admitting: Physical Therapy

## 2017-02-19 ENCOUNTER — Ambulatory Visit (INDEPENDENT_AMBULATORY_CARE_PROVIDER_SITE_OTHER): Payer: Medicare Other | Admitting: Osteopathic Medicine

## 2017-02-19 VITALS — BP 149/77 | HR 56 | Temp 97.6°F | Wt 177.0 lb

## 2017-02-19 DIAGNOSIS — N6320 Unspecified lump in the left breast, unspecified quadrant: Secondary | ICD-10-CM

## 2017-02-19 NOTE — Progress Notes (Signed)
HPI: Kimberly Jackson is a 71 y.o. female  who presents to [facility] today, 02/19/17,  for chief complaint of:  Chief Complaint  Patient presents with  . Breast Mass    left     . Breast concern: L breast x about 6 days. Describes as lump like a golf ball. She states it has "changed" but she is not able to really describe how. No drainage. Did not find this on self breast exam, she seems to call being on the phone and just itching the breast and felt something like a lump  Previous mammogram on file 02/16/2015: Dense breast tissue, recommended follow-up ultrasound. Overall BI-RADS 1 negative findings and recommendation for annual screening. Patient has not had mammograms since that date.    Past medical, surgical, social and family history reviewed: Patient Active Problem List   Diagnosis Date Noted  . Left shoulder pain 12/09/2016  . OSA and COPD overlap syndrome ([place]) 07/05/2016  . Right foot pain 12/07/2015  . Vitamin D deficiency 09/11/2015  . Prediabetes 09/08/2015  . Osteopenia 09/08/2015  . Ventral hernia 09/08/2015  . COPD (chronic obstructive pulmonary disease) ([place]) 09/04/2015  . Paroxysmal A-fib ([place]) 09/04/2015  . HTN (hypertension) 09/04/2015  . Long-term use of high-risk medication 09/04/2015   Past Surgical History:  Procedure Laterality Date  . ABDOMINAL HYSTERECTOMY    . APPENDECTOMY     71 yo  . BLADDER SURGERY  2015  . CHOLECYSTECTOMY    . REPLACEMENT TOTAL KNEE    . ROTATOR CUFF REPAIR    . TONSILLECTOMY     Social History  Substance Use Topics  . Smoking status: Never Smoker  . Smokeless tobacco: Never Used  . Alcohol use No   Family History  Problem Relation Age of Onset  . Alcohol abuse Mother      Current medication list and allergy/intolerance information reviewed:   Current Outpatient Prescriptions  Medication Sig Dispense Refill  . AMBULATORY NON FORMULARY MEDICATION Blood pressure monitor use daily  for hypertension. 1 Device 0  . Cholecalciferol (VITAMIN D3) 5000 units CAPS Take 5,000 Units by mouth daily.    . diclofenac sodium (VOLTAREN) 1 % GEL Apply 2 g topically 4 (four) times daily. To affected joint. 100 g 11  . digoxin (LANOXIN) 0.125 MG tablet Take 1 tablet (125 mcg total) by mouth daily. Due for follow up visit 30 tablet 0  . fluticasone (FLONASE) 50 MCG/ACT nasal spray SPRAY 1 SPRAY IN EACH NOSTRILS BID  0  . fluticasone-salmeterol (ADVAIR HFA) 115-21 MCG/ACT inhaler Inhale 2 puffs into the lungs 2 (two) times daily. 1 Inhaler 6  . Ipratropium-Albuterol (COMBIVENT) 20-100 MCG/ACT AERS respimat Inhale 1 puff into the lungs every 6 (six) hours as needed for wheezing or shortness of breath. 1 Inhaler 5  . ipratropium-albuterol (DUONEB) 0.5-2.5 (3) MG/3ML SOLN Take 3 mLs by nebulization every 6 (six) hours as needed. 360 mL 1  . lisinopril (PRINIVIL,ZESTRIL) 10 MG tablet Take 1 tablet (10 mg total) by mouth daily. 90 tablet 1  . metoprolol succinate (TOPROL-XL) 100 MG 24 hr tablet Take 1 tablet (100 mg total) by mouth daily. Take with or immediately following a meal. 90 tablet 1  . omeprazole (PRILOSEC) 40 MG capsule Take 1 capsule (40 mg total) by mouth daily. 90 capsule 3  . triamterene-hydrochlorothiazide (MAXZIDE-25) 37.5-25 MG tablet Take 1 tablet by mouth daily. 90 tablet 1   No current facility-administered medications for this visit.  Allergies  Allergen Reactions  . Cobalt-Mn [Manganese]   . Compazine [Prochlorperazine]   . Corticosteroids   . Eggs Or Egg-Derived Products   . Penicillins       Review of Systems:  Constitutional:  No  fever, no chills, No recent illness, No unintentional weight changes. No significant fatigue.   Cardiac: No  chest pain  Skin: No  Rash, No other wounds/concerning lesions    Exam:  BP (!) 149/77   Pulse (!) 56   Temp 97.6 F (36.4 C) (Oral)   Wt 177 lb (80.3 kg)   Constitutional: VS see above. General Appearance: alert,  well-developed, well-nourished  Skin: warm, dry, intact. No rash/ulcer. No concerning nevi or subq nodules on limited exam.    Psychiatric: Normal judgment/insight. Normal mood and affect. Oriented x3.   BREAST: No rashes/skin changes, dense fibrous breast tissue, no tenderness, normal nipple without discharge, normal axilla. On left breast at about 4:00 to 5:00 area fairly close to nipple palpable mass is firm but not distinct borders, mobile, nontender. Not really consistent with cyst or calcification, more consistent with dense lobe anatomy but could not say for sure. Right breast normal    ASSESSMENT/PLAN:   Left breast lump - Plan: MM Digital Diagnostic Bilat, US BREAST LTD UNI LEFT INC AXILLA   Patient Instructions  Plan: Just from feeling this, it feels to me like benign dense breast tissue, but I could not rule rule out a more concerning lump or cancer without further imaging. I have placed a referral to the breast center in [facility] for further imaging with mammogram and likely ultrasound. Please let us know if you do not her back about an appointment in the next few days. Any other questions or concerns, please let us know!     Visit summary with medication list and pertinent instructions was printed for patient to review. All questions at time of visit were answered - patient instructed to contact office with any additional concerns. ER/RTC precautions were reviewed with the patient. Follow-up plan: Return if symptoms worsen or fail to improve, and for routine care as directed by PCP, sooner if needed![name]

## 2017-02-19 NOTE — Patient Instructions (Signed)
Plan: Just from feeling this, it feels to me like benign dense breast tissue, but I could not rule rule out a more concerning lump or cancer without further imaging. I have placed a referral to the breast center in [facility] for further imaging with mammogram and likely ultrasound. Please let us know if you do not her back about an appointment in the next few days. Any other questions or concerns, please let us know!

## 2017-02-21 ENCOUNTER — Ambulatory Visit (INDEPENDENT_AMBULATORY_CARE_PROVIDER_SITE_OTHER): Payer: Medicare Other | Admitting: Rehabilitative and Restorative Service Providers"

## 2017-02-21 ENCOUNTER — Encounter: Payer: Self-pay | Admitting: Rehabilitative and Restorative Service Providers"

## 2017-02-21 DIAGNOSIS — G8929 Other chronic pain: Secondary | ICD-10-CM | POA: Diagnosis not present

## 2017-02-21 DIAGNOSIS — M6281 Muscle weakness (generalized): Secondary | ICD-10-CM | POA: Diagnosis not present

## 2017-02-21 DIAGNOSIS — M25612 Stiffness of left shoulder, not elsewhere classified: Secondary | ICD-10-CM

## 2017-02-21 DIAGNOSIS — M25512 Pain in left shoulder: Secondary | ICD-10-CM | POA: Diagnosis not present

## 2017-02-21 DIAGNOSIS — R293 Abnormal posture: Secondary | ICD-10-CM

## 2017-02-21 NOTE — Therapy (Signed)
[place], [place], [address] Phone: [phone]   Fax:  [phone]  Physical Therapy Treatment  Patient Details  Name: Nickie Warwick MRN: [id number] Date of Birth: 12-04-1945 Referring Provider: Dr Lynne Leader   Encounter Date: 02/21/2017      PT End of Session - 02/21/17 0928    Visit Number 7   PT Start Time 8144   PT Stop Time 1024   PT Time Calculation (min) 56 min   Activity Tolerance Patient tolerated treatment well      Past Medical History:  Diagnosis Date  . Alcohol abuse   . COPD (chronic obstructive pulmonary disease) ([place])   . Depression   . Reflux     Past Surgical History:  Procedure Laterality Date  . ABDOMINAL HYSTERECTOMY    . APPENDECTOMY     71 yo  . BLADDER SURGERY  2015  . CHOLECYSTECTOMY    . REPLACEMENT TOTAL KNEE    . ROTATOR CUFF REPAIR    . TONSILLECTOMY      There were no vitals filed for this visit.      Subjective Assessment - 02/21/17 0929    Subjective Patient reports that her shoulder is improving but she continues to have some trouble. She has pain with reaching up and back. She is sleeping better. She did some exercises on vacation but not as much as she should have. Has noticed area of tightness and knot in Lt breast area.She has seen the MD and will be scheduling mamogram.    Currently in Pain? No/denies            [facility] PT Assessment - 02/21/17 0001      Assessment   Medical Diagnosis chronic Lt shoulder pain   Referring Provider Dr Lynne Leader    Onset Date/Surgical Date 09/12/16   Hand Dominance Right   Next MD Visit 02/27/17   Prior Therapy not for Lt shoulder     AROM   Right Shoulder Extension 47 Degrees   Right Shoulder Flexion 157 Degrees   Right Shoulder ABduction 140 Degrees   Right Shoulder Internal Rotation 58 Degrees  seated shd/elbow at 90 deg    Right Shoulder External Rotation 85 Degrees  seated shd/elbow at 90 deg     Left Shoulder Extension 55 Degrees   Left Shoulder Flexion 128 Degrees   Left Shoulder ABduction 133 Degrees   Left Shoulder Internal Rotation 27 Degrees  standing shd/elbow 90 deg   Left Shoulder External Rotation 73 Degrees  standing shd/elbow 90 deg      Strength   Left Shoulder Extension 5/5   Left Shoulder ABduction 4+/5   Left Shoulder Internal Rotation 5/5   Left Shoulder External Rotation 5/5     Palpation   Palpation comment tightness Lt pecs; upper trap; teres; medial scapular musculature                     [facility] Adult PT Treatment/Exercise - 02/21/17 0001      Shoulder Exercises: Standing   Extension Strengthening;Both;15 reps;Theraband   Theraband Level (Shoulder Extension) Level 3 (Green)   Extension Limitations AAROM extension with cane x 5    Row Strengthening;Both;15 reps;Theraband   Theraband Level (Shoulder Row) Level 3 (Green)   Row Limitations step back with red TB x 10    Retraction Strengthening;15 reps;Theraband   Theraband Level (Shoulder Retraction) Level 1 (Yellow)   Other Standing Exercises scap squeeze  10 sec x 10 with noodle    Other Standing Exercises W's with noodle x 10      Shoulder Exercises: Pulleys   Flexion --  10 sec x 10 reps    ABduction --  10 sec x 10      Shoulder Exercises: Stretch   Other Shoulder Stretches supine snow angel stretch with arms abducted ~ 70 degrees.    Other Shoulder Stretches doorway stretcy lower and mid positions bilat UE's 30 sec x 3 - added higher position with minimal step through the doorway 2 reps 30 sec      Modalities   Modalities Moist Heat     Moist Heat Therapy   Number Minutes Moist Heat 15 Minutes   Moist Heat Location Shoulder  Lt     Manual Therapy   Manual Therapy Soft tissue mobilization   Manual therapy comments pt supine    Joint Mobilization Grade II mobs Lt GH joint with circumduction of joint for capsular stretching    Soft tissue mobilization upper trap;  leveator; medial scapular border; teres/lats; pecs    Scapular Mobilization Lt scap protraction and depression - moving along thoracic wall    Passive ROM Lt shoulder flexion; abduction; ER; IR; extension; horizontal abductoin                       PT Long Term Goals - 02/21/17 0954      PT LONG TERM GOAL #1   Title I with advanced HEP (04/04/17)    Time 12   Period Weeks   Status Revised     PT LONG TERM GOAL #2   Title improve Lt shoulder ROM to within 10 degrees of Rt to increase ease of reaching  ( 04/04/17)    Time 12   Period Weeks   Status Revised     PT LONG TERM GOAL #3   Title increase strength Lt shoulder =/> 5-/5 to assist with daily activities ( 04/04/17)    Time 12   Period Weeks   Status Revised     PT LONG TERM GOAL #4   Title improve FOTO =/< 36% limited, CJ level ( 04/04/17)    Time 12   Period Weeks   Status Revised     PT LONG TERM GOAL #5   Title report decrease Lt shoulder pain to allow her to sleep per her previous level ( 02/21/17)    Time 6   Period Weeks   Status Achieved               Plan - 02/21/17 1014    Clinical Impression Statement [name] has been out of town for two weeks and has had less time for exercise. ROM and strength are unchanged compared to two weeks ago. Pain has improved. [name] is now sleeping without awakening due to pain and she only has Lt shoudler pain with reaching activities. [name] has discovered a breast lump in Lt breast and will be scheduled for mamogram. She is progressing well toward stated goals of therapy but will benefit from continued skilled PT to reach maximum rehab potential. She did not continue to progress without therapy.    Rehab Potential Good   PT Frequency 2x / week   PT Duration 12 weeks   PT Treatment/Interventions Moist Heat;Ultrasound;Therapeutic exercise;Dry needling;Taping;Vasopneumatic Device;Manual techniques;Neuromuscular re-education;Cryotherapy;Electrical Stimulation;Iontophoresis  4mg /ml Dexamethasone;Patient/family education   PT Next Visit Plan continue joint mobs; PROM; stretching to tolerance; posterior shoulder girdle strengthening;  manual work and modalities as indicated Focus on [employer] all planes with manual work and passive stretching; continue Insurance claims handler with Plan of Care Patient      Patient will benefit from skilled therapeutic intervention in order to improve the following deficits and impairments:  Decreased range of motion, Impaired UE functional use, Increased muscle spasms, Pain, Decreased strength, Postural dysfunction, Improper body mechanics  Visit Diagnosis: Stiffness of left shoulder, not elsewhere classified - Plan: PT plan of care cert/re-cert  Abnormal posture - Plan: PT plan of care cert/re-cert  Muscle weakness (generalized) - Plan: PT plan of care cert/re-cert  Chronic left shoulder pain - Plan: PT plan of care cert/re-cert     Problem List Patient Active Problem List   Diagnosis Date Noted  . Left shoulder pain 12/09/2016  . OSA and COPD overlap syndrome ([place]) 07/05/2016  . Right foot pain 12/07/2015  . Vitamin D deficiency 09/11/2015  . Prediabetes 09/08/2015  . Osteopenia 09/08/2015  . Ventral hernia 09/08/2015  . COPD (chronic obstructive pulmonary disease) ([place]) 09/04/2015  . Paroxysmal A-fib ([place]) 09/04/2015  . HTN (hypertension) 09/04/2015  . Long-term use of high-risk medication 09/04/2015    Elane Peabody Nilda Simmer PT, MPH  02/21/2017, 10:18 AM  [facility] [place], [place], [address] Phone: [phone]   Fax:  [phone]  Name: Lahoma Constantin MRN: [id number] Date of Birth: 1946/07/08

## 2017-02-25 ENCOUNTER — Other Ambulatory Visit: Payer: Self-pay | Admitting: Osteopathic Medicine

## 2017-02-25 ENCOUNTER — Encounter: Payer: Self-pay | Admitting: Rehabilitative and Restorative Service Providers"

## 2017-02-25 ENCOUNTER — Ambulatory Visit (INDEPENDENT_AMBULATORY_CARE_PROVIDER_SITE_OTHER): Payer: Medicare Other | Admitting: Rehabilitative and Restorative Service Providers"

## 2017-02-25 ENCOUNTER — Other Ambulatory Visit: Payer: Self-pay

## 2017-02-25 DIAGNOSIS — M25612 Stiffness of left shoulder, not elsewhere classified: Secondary | ICD-10-CM

## 2017-02-25 DIAGNOSIS — M6281 Muscle weakness (generalized): Secondary | ICD-10-CM | POA: Diagnosis not present

## 2017-02-25 DIAGNOSIS — R293 Abnormal posture: Secondary | ICD-10-CM

## 2017-02-25 DIAGNOSIS — G8929 Other chronic pain: Secondary | ICD-10-CM | POA: Diagnosis not present

## 2017-02-25 DIAGNOSIS — N6320 Unspecified lump in the left breast, unspecified quadrant: Secondary | ICD-10-CM

## 2017-02-25 DIAGNOSIS — M25512 Pain in left shoulder: Secondary | ICD-10-CM | POA: Diagnosis not present

## 2017-02-25 NOTE — Therapy (Signed)
[place], [place], [address] Phone: [phone]   Fax:  [phone]  Physical Therapy Treatment  Patient Details  Name: Kimberly Jackson MRN: [id number] Date of Birth: 07-06-1946 Referring Provider: Dr Lynne Leader   Encounter Date: 02/25/2017      PT End of Session - 02/25/17 0934    Visit Number 8   Number of Visits 12   Date for PT Re-Evaluation 02/21/17   PT Start Time 0931   PT Stop Time 1025   PT Time Calculation (min) 54 min   Activity Tolerance Patient tolerated treatment well      Past Medical History:  Diagnosis Date  . Alcohol abuse   . COPD (chronic obstructive pulmonary disease) ([place])   . Depression   . Reflux     Past Surgical History:  Procedure Laterality Date  . ABDOMINAL HYSTERECTOMY    . APPENDECTOMY     71 yo  . BLADDER SURGERY  2015  . CHOLECYSTECTOMY    . REPLACEMENT TOTAL KNEE    . ROTATOR CUFF REPAIR    . TONSILLECTOMY      There were no vitals filed for this visit.      Subjective Assessment - 02/25/17 0934    Subjective Patient reports that the shoulder is a lot better but she has pain with using the Lt UE for things like dressing or reaching high or to the Lt too far    Currently in Pain? No/denies            [facility] PT Assessment - 02/25/17 0001      Assessment   Medical Diagnosis chronic Lt shoulder pain   Referring Provider Dr Lynne Leader    Onset Date/Surgical Date 09/12/16   Hand Dominance Right   Next MD Visit 02/27/17   Prior Therapy not for Lt shoulder     AROM   Left Shoulder Extension 55 Degrees   Left Shoulder Flexion 130 Degrees   Left Shoulder ABduction 134 Degrees   Left Shoulder Internal Rotation 27 Degrees  standing shd/elbow 90 deg   Left Shoulder External Rotation 75 Degrees  standing shd/elbow 90 deg      Strength   Left Shoulder Extension 5/5   Left Shoulder ABduction 4+/5   Left Shoulder Internal Rotation 5/5   Left  Shoulder External Rotation 5/5     Palpation   Palpation comment tightness Lt pecs; upper trap; teres; medial scapular musculature                     [facility] Adult PT Treatment/Exercise - 02/25/17 0001      Shoulder Exercises: Standing   Extension Strengthening;Both;15 reps;Theraband   Theraband Level (Shoulder Extension) Level 3 (Green)   Extension Limitations AAROM extension with cane x 5    Row Strengthening;Both;15 reps;Theraband   Theraband Level (Shoulder Row) Level 3 (Green)   Row Limitations step back with red TB x 10    Retraction Strengthening;15 reps;Theraband   Theraband Level (Shoulder Retraction) Level 2 (Red)   Other Standing Exercises scap squeeze 10 sec x 10 with noodle    Other Standing Exercises W's with noodle x 10      Shoulder Exercises: Pulleys   Flexion --  10 sec x 10 reps    ABduction --  10 sec x 10      Shoulder Exercises: Therapy Ball   Other Therapy Ball Exercises scapular depression pressing into ball 5 sec  x 10 reps x 2 sets      Shoulder Exercises: Stretch   Internal Rotation Stretch 5 reps  20 sec    Other Shoulder Stretches supine snow angel stretch with arms abducted ~ 70 degrees.    Other Shoulder Stretches doorway stretcy lower and mid positions bilat UE's 30 sec x 3 - added higher position with minimal step through the doorway 2 reps 30 sec      Modalities   Modalities Moist Heat     Moist Heat Therapy   Number Minutes Moist Heat 15 Minutes   Moist Heat Location Shoulder  Lt     Manual Therapy   Manual Therapy Soft tissue mobilization   Manual therapy comments pt supine    Joint Mobilization Grade II mobs Lt GH joint with circumduction of joint for capsular stretching    Soft tissue mobilization upper trap; leveator; medial scapular border; teres/lats; pecs    Scapular Mobilization Lt scap protraction and depression - moving along thoracic wall    Passive ROM Lt shoulder flexion; abduction; ER; IR; extension;  horizontal abductoin                  PT Education - 02/25/17 0950    Education provided Yes   Education Details HEP    Person(s) Educated Patient   Methods Explanation;Demonstration;Tactile cues;Verbal cues;Handout   Comprehension Verbalized understanding;Returned demonstration;Verbal cues required;Tactile cues required             PT Long Term Goals - 02/25/17 7619      PT LONG TERM GOAL #1   Title I with advanced HEP (04/04/17)    Time 12   Period Weeks   Status On-going     PT LONG TERM GOAL #2   Title improve Lt shoulder ROM to within 10 degrees of Rt to increase ease of reaching  ( 04/04/17)    Time 12   Period Weeks   Status On-going     PT LONG TERM GOAL #3   Title increase strength Lt shoulder =/> 5-/5 to assist with daily activities ( 04/04/17)    Time 12   Period Weeks   Status On-going     PT LONG TERM GOAL #4   Title improve FOTO =/< 36% limited, CJ level ( 04/04/17)    Time 12   Period Weeks   Status On-going     PT LONG TERM GOAL #5   Title report decrease Lt shoulder pain to allow her to sleep per her previous level ( 02/21/17)    Time 6   Period Weeks   Status On-going               Plan - 02/25/17 0935    Clinical Impression Statement Continued progress on a gradual basis. Patient has returned to home and is more consistent with HEP. Functionally she is improving with no pain with cooking or cleaning. She continues to have pain in the shoulder with dressing and reaching up or to the Lt. Continues to benefit from PT.    Rehab Potential Good   PT Frequency 2x / week   PT Duration 12 weeks   PT Treatment/Interventions Moist Heat;Ultrasound;Therapeutic exercise;Dry needling;Taping;Vasopneumatic Device;Manual techniques;Neuromuscular re-education;Cryotherapy;Electrical Stimulation;Iontophoresis 4mg /ml Dexamethasone;Patient/family education   PT Next Visit Plan continue joint mobs; PROM; stretching to tolerance; posterior shoulder girdle  strengthening; manual work and modalities as indicated Focus on AAROM-PROM all planes with manual work and passive stretching; continue Radiation protection practitioner and Agree  with Plan of Care Patient      Patient will benefit from skilled therapeutic intervention in order to improve the following deficits and impairments:  Decreased range of motion, Impaired UE functional use, Increased muscle spasms, Pain, Decreased strength, Postural dysfunction, Improper body mechanics  Visit Diagnosis: Stiffness of left shoulder, not elsewhere classified  Abnormal posture  Muscle weakness (generalized)  Chronic left shoulder pain     Problem List Patient Active Problem List   Diagnosis Date Noted  . Left shoulder pain 12/09/2016  . OSA and COPD overlap syndrome ([place]) 07/05/2016  . Right foot pain 12/07/2015  . Vitamin D deficiency 09/11/2015  . Prediabetes 09/08/2015  . Osteopenia 09/08/2015  . Ventral hernia 09/08/2015  . COPD (chronic obstructive pulmonary disease) ([place]) 09/04/2015  . Paroxysmal A-fib ([place]) 09/04/2015  . HTN (hypertension) 09/04/2015  . Long-term use of high-risk medication 09/04/2015    [name] Nilda Simmer PT, MPH 02/25/2017, 9:52 AM  [facility] [place], [place], [address] Phone: [phone]   Fax:  [phone]  Name: Kimberly Jackson MRN: [id number] Date of Birth: 1946/01/21

## 2017-02-25 NOTE — Patient Instructions (Addendum)
Internal Rotator Cuff Stretch, Standing    Stand holding club behind body, one arm above head, other arm bent behind back. With upper hand, pull gently upward. Hold _20__ seconds. Repeat _3-5__ times per session. Do _2_ sessions per day.

## 2017-02-26 ENCOUNTER — Other Ambulatory Visit: Payer: Self-pay | Admitting: Family Medicine

## 2017-02-27 ENCOUNTER — Encounter: Payer: Self-pay | Admitting: Rehabilitative and Restorative Service Providers"

## 2017-02-27 ENCOUNTER — Ambulatory Visit (INDEPENDENT_AMBULATORY_CARE_PROVIDER_SITE_OTHER): Payer: Medicare Other | Admitting: Rehabilitative and Restorative Service Providers"

## 2017-02-27 ENCOUNTER — Encounter: Payer: Self-pay | Admitting: Family Medicine

## 2017-02-27 ENCOUNTER — Ambulatory Visit (INDEPENDENT_AMBULATORY_CARE_PROVIDER_SITE_OTHER): Payer: Medicare Other | Admitting: Family Medicine

## 2017-02-27 VITALS — BP 133/81 | HR 57 | Wt 177.0 lb

## 2017-02-27 DIAGNOSIS — M25512 Pain in left shoulder: Secondary | ICD-10-CM | POA: Diagnosis not present

## 2017-02-27 DIAGNOSIS — M6281 Muscle weakness (generalized): Secondary | ICD-10-CM | POA: Diagnosis not present

## 2017-02-27 DIAGNOSIS — R293 Abnormal posture: Secondary | ICD-10-CM | POA: Diagnosis not present

## 2017-02-27 DIAGNOSIS — G8929 Other chronic pain: Secondary | ICD-10-CM

## 2017-02-27 DIAGNOSIS — M25612 Stiffness of left shoulder, not elsewhere classified: Secondary | ICD-10-CM

## 2017-02-27 MED ORDER — OMEPRAZOLE 40 MG PO CPDR: 40.0000 mg | DELAYED_RELEASE_CAPSULE | Freq: Every day | ORAL | 3 refills | Status: DC

## 2017-02-27 NOTE — Therapy (Signed)
[place], [place], [address] Phone: [phone]   Fax:  [phone]  Physical Therapy Treatment  Patient Details  Name: Kimberly Jackson MRN: [id number] Date of Birth: 31-Aug-1945 Referring Provider: Dr Lynne Leader   Encounter Date: 02/27/2017      PT End of Session - 02/27/17 0915    Visit Number 9   Number of Visits 12   Date for PT Re-Evaluation 04/04/17   PT Start Time 0925   PT Stop Time 1021   PT Time Calculation (min) 56 min   Activity Tolerance Patient tolerated treatment well      Past Medical History:  Diagnosis Date  . Alcohol abuse   . COPD (chronic obstructive pulmonary disease) ([place])   . Depression   . Reflux     Past Surgical History:  Procedure Laterality Date  . ABDOMINAL HYSTERECTOMY    . APPENDECTOMY     71 yo  . BLADDER SURGERY  2015  . CHOLECYSTECTOMY    . REPLACEMENT TOTAL KNEE    . ROTATOR CUFF REPAIR    . TONSILLECTOMY      There were no vitals filed for this visit.      Subjective Assessment - 02/27/17 0928    Subjective Scheduled for mamogram Tuesday 8/14. Shoulder is doing OK today.    Currently in Pain? No/denies            [facility] PT Assessment - 02/27/17 0001      Assessment   Medical Diagnosis chronic Lt shoulder pain   Referring Provider Dr Lynne Leader    Onset Date/Surgical Date 09/12/16   Hand Dominance Right   Next MD Visit 02/27/17   Prior Therapy not for Lt shoulder     AROM   Right Shoulder Extension 47 Degrees   Right Shoulder Flexion 157 Degrees   Right Shoulder ABduction 140 Degrees   Right Shoulder Internal Rotation 58 Degrees  standing shd/elbow 90 deg   Right Shoulder External Rotation 85 Degrees  standing shd/elbow 90 deg    Left Shoulder Extension 55 Degrees   Left Shoulder Flexion 130 Degrees   Left Shoulder ABduction 135 Degrees   Left Shoulder Internal Rotation 27 Degrees  standing shd/elbow 90 deg   Left Shoulder  External Rotation 75 Degrees  standing shd/elbow 90 deg      Strength   Left Shoulder Extension 5/5   Left Shoulder ABduction 4+/5   Left Shoulder Internal Rotation 5/5   Left Shoulder External Rotation 5/5     Palpation   Palpation comment tightness Lt pecs; upper trap; teres; medial scapular musculature                     [facility] Adult PT Treatment/Exercise - 02/27/17 0001      Shoulder Exercises: Standing   Extension Strengthening;Both;15 reps;Theraband   Theraband Level (Shoulder Extension) Level 3 (Green)   Extension Limitations AAROM extension with cane x 5    Row Strengthening;Both;15 reps;Theraband   Theraband Level (Shoulder Row) Level 3 (Green)   Row Limitations step back with red TB x 10    Retraction Strengthening;15 reps;Theraband   Theraband Level (Shoulder Retraction) Level 2 (Red)   Other Standing Exercises scap squeeze 10 sec x 10 with noodle    Other Standing Exercises W's with noodle x 10      Shoulder Exercises: Pulleys   Flexion --  10 sec x 10 reps    ABduction --  10 sec x 10      Shoulder Exercises: Therapy Ball   Other Therapy Ball Exercises scapular depression pressing into ball 5 sec x 10 reps x 2 sets      Shoulder Exercises: Stretch   Internal Rotation Stretch 5 reps  20 sec    Other Shoulder Stretches supine snow angel stretch with arms abducted ~ 70 degrees.    Other Shoulder Stretches doorway stretcy lower and mid positions bilat UE's 30 sec x 3 - added higher position with minimal step through the doorway 2 reps 30 sec      Modalities   Modalities Moist Heat     Moist Heat Therapy   Number Minutes Moist Heat 15 Minutes   Moist Heat Location Shoulder  Lt     Manual Therapy   Manual Therapy Soft tissue mobilization   Manual therapy comments pt supine    Joint Mobilization Grade II mobs Lt GH joint with circumduction of joint for capsular stretching    Soft tissue mobilization upper trap; leveator; medial scapular border;  teres/lats; pecs    Scapular Mobilization Lt scap protraction and depression - moving along thoracic wall    Passive ROM Lt shoulder flexion; abduction; ER; IR; extension; horizontal abductoin                       PT Long Term Goals - 02/25/17 7741      PT LONG TERM GOAL #1   Title I with advanced HEP (04/04/17)    Time 12   Period Weeks   Status On-going     PT LONG TERM GOAL #2   Title improve Lt shoulder ROM to within 10 degrees of Rt to increase ease of reaching  ( 04/04/17)    Time 12   Period Weeks   Status On-going     PT LONG TERM GOAL #3   Title increase strength Lt shoulder =/> 5-/5 to assist with daily activities ( 04/04/17)    Time 12   Period Weeks   Status On-going     PT LONG TERM GOAL #4   Title improve FOTO =/< 36% limited, CJ level ( 04/04/17)    Time 12   Period Weeks   Status On-going     PT LONG TERM GOAL #5   Title report decrease Lt shoulder pain to allow her to sleep per her previous level ( 02/21/17)    Time 6   Period Weeks   Status On-going               Plan - 02/27/17 0930    Clinical Impression Statement Gradual progress continues. Patient has pain with active movement in end ranges elevation and reaching behind her back. She continues to have end range tightness and limited functional activities with Lt UE. [name] is progressing toward stated goals of therapy and is progressing more now that she is traveling and better able to exercise consistently at home. [name] will benefit from continued therapy to accomplish goals of therapy.    Rehab Potential Good   PT Frequency 2x / week   PT Duration 12 weeks   PT Treatment/Interventions Moist Heat;Ultrasound;Therapeutic exercise;Dry needling;Taping;Vasopneumatic Device;Manual techniques;Neuromuscular re-education;Cryotherapy;Electrical Stimulation;Iontophoresis 4mg /ml Dexamethasone;Patient/family education   PT Next Visit Plan continue joint mobs; PROM; stretching to tolerance; posterior  shoulder girdle strengthening; manual work and modalities as indicated Focus on AAROM-PROM all planes with manual work and passive stretching; continue Radiation protection practitioner and Agree with  Plan of Care Patient      Patient will benefit from skilled therapeutic intervention in order to improve the following deficits and impairments:  Decreased range of motion, Impaired UE functional use, Increased muscle spasms, Pain, Decreased strength, Postural dysfunction, Improper body mechanics, Decreased activity tolerance  Visit Diagnosis: Stiffness of left shoulder, not elsewhere classified  Abnormal posture  Muscle weakness (generalized)  Chronic left shoulder pain     Problem List Patient Active Problem List   Diagnosis Date Noted  . Left shoulder pain 12/09/2016  . OSA and COPD overlap syndrome ([place]) 07/05/2016  . Right foot pain 12/07/2015  . Vitamin D deficiency 09/11/2015  . Prediabetes 09/08/2015  . Osteopenia 09/08/2015  . Ventral hernia 09/08/2015  . COPD (chronic obstructive pulmonary disease) ([place]) 09/04/2015  . Paroxysmal A-fib ([place]) 09/04/2015  . HTN (hypertension) 09/04/2015  . Long-term use of high-risk medication 09/04/2015    Hendrick Pavich Nilda Simmer PT, MPH  02/27/2017, 10:18 AM  [facility] [place], [place], [address] Phone: [phone]   Fax:  [phone]  Name: Marikay Roads MRN: [id number] Date of Birth: 1945-09-03

## 2017-02-27 NOTE — Patient Instructions (Addendum)
Thank you for coming in today. We will get results from [facility] soon Attend PT.  Recheck with me in 6 months or so.   Get the flu shot when they are available.   Recheck with me as needed.

## 2017-02-27 NOTE — Progress Notes (Signed)
Kimberly Jackson is a 71 y.o. female who presents to [facility] today for left shoulder pain. Patient has been seen recently for left shoulder pain thought to be due to adhesive capsulitis or rotator cuff tendinopathy. She has been attending PT and is feeling a lot better. She still is having some pain and limited motion and both Darielle and PT thinks she could use a few more weeks of PT. she is doing quite well overall.  Additionally she notes that she was seen last week for breast mass and has a diagnostic mammogram and ultrasound scheduled for next week.  Past Medical History:  Diagnosis Date  . Alcohol abuse   . COPD (chronic obstructive pulmonary disease) ([place])   . Depression   . Reflux    Past Surgical History:  Procedure Laterality Date  . ABDOMINAL HYSTERECTOMY    . APPENDECTOMY     71 yo  . BLADDER SURGERY  2015  . CHOLECYSTECTOMY    . REPLACEMENT TOTAL KNEE    . ROTATOR CUFF REPAIR    . TONSILLECTOMY     Social History  Substance Use Topics  . Smoking status: Never Smoker  . Smokeless tobacco: Never Used  . Alcohol use No   family history includes Alcohol abuse in her mother.  ROS as above:  Medications: Current Outpatient Prescriptions  Medication Sig Dispense Refill  . AMBULATORY NON FORMULARY MEDICATION Blood pressure monitor use daily for hypertension. 1 Device 0  . Cholecalciferol (VITAMIN D3) 5000 units CAPS Take 5,000 Units by mouth daily.    . diclofenac sodium (VOLTAREN) 1 % GEL Apply 2 g topically 4 (four) times daily. To affected joint. 100 g 11  . DIGOX 125 MCG tablet TAKE 1 TABLET BY MOUTH DAILY 90 tablet 0  . fluticasone (FLONASE) 50 MCG/ACT nasal spray SPRAY 1 SPRAY IN EACH NOSTRILS BID  0  . fluticasone-salmeterol (ADVAIR HFA) 115-21 MCG/ACT inhaler Inhale 2 puffs into the lungs 2 (two) times daily. 1 Inhaler 6  .  Ipratropium-Albuterol (COMBIVENT) 20-100 MCG/ACT AERS respimat Inhale 1 puff into the lungs every 6 (six) hours as needed for wheezing or shortness of breath. 1 Inhaler 5  . ipratropium-albuterol (DUONEB) 0.5-2.5 (3) MG/3ML SOLN Take 3 mLs by nebulization every 6 (six) hours as needed. 360 mL 1  . lisinopril (PRINIVIL,ZESTRIL) 10 MG tablet Take 1 tablet (10 mg total) by mouth daily. 90 tablet 1  . metoprolol succinate (TOPROL-XL) 100 MG 24 hr tablet Take 1 tablet (100 mg total) by mouth daily. Take with or immediately following a meal. 90 tablet 1  . omeprazole (PRILOSEC) 40 MG capsule Take 1 capsule (40 mg total) by mouth daily. 90 capsule 3  . triamterene-hydrochlorothiazide (MAXZIDE-25) 37.5-25 MG tablet Take 1 tablet by mouth daily. 90 tablet 1   No current facility-administered medications for this visit.    Allergies  Allergen Reactions  . Cobalt-Mn [Manganese]   . Compazine [Prochlorperazine]   . Corticosteroids   . Eggs Or Egg-Derived Products   . Penicillins     Health Maintenance Health Maintenance  Topic Date Due  . MAMMOGRAM  02/15/2017  . INFLUENZA VACCINE  02/19/2017  . COLONOSCOPY  07/28/2017  . TETANUS/TDAP  11/19/2024  . DEXA SCAN  Completed  . Hepatitis C Screening  Completed  . PNA vac Low Risk Adult  Completed     Exam:  BP 133/81   Pulse (!) 57   Wt 177  lb (80.3 kg)  Gen: Well NAD HEENT: EOMI,  MMM Lungs: Normal work of breathing. CTABL Heart: RRR no MRG Abd: NABS, Soft. Nondistended, Nontender Exts: Brisk capillary refill, warm and well perfused.  Left shoulder: Normal-appearing nontender decreased internal and external range of motion. Abduction limited by 10.   No results found for this or any previous visit (from the past 72 hour(s)). No results found.    Assessment and Plan: 71 y.o. female with left shoulder adhesive capsulitis doing well. Plan to continue physical therapy.  Await breasts diagnostic ultrasound and mammogram  results.  Recheck 6 months or sooner if needed Orders Placed This Encounter  Procedures  . Ambulatory referral to Physical Therapy    Referral Priority:   Routine    Referral Type:   Physical Medicine    Referral Reason:   Specialty Services Required    Requested Specialty:   Physical Therapy   Meds ordered this encounter  Medications  . omeprazole (PRILOSEC) 40 MG capsule    Sig: Take 1 capsule (40 mg total) by mouth daily.    Dispense:  90 capsule    Refill:  3     Discussed warning signs or symptoms. Please see discharge instructions. Patient expresses understanding.

## 2017-03-04 ENCOUNTER — Other Ambulatory Visit: Payer: Medicare Other

## 2017-03-05 ENCOUNTER — Ambulatory Visit (INDEPENDENT_AMBULATORY_CARE_PROVIDER_SITE_OTHER): Payer: Medicare Other | Admitting: Rehabilitative and Restorative Service Providers"

## 2017-03-05 ENCOUNTER — Encounter: Payer: Self-pay | Admitting: Rehabilitative and Restorative Service Providers"

## 2017-03-05 DIAGNOSIS — R293 Abnormal posture: Secondary | ICD-10-CM | POA: Diagnosis not present

## 2017-03-05 DIAGNOSIS — M25512 Pain in left shoulder: Secondary | ICD-10-CM | POA: Diagnosis not present

## 2017-03-05 DIAGNOSIS — M25612 Stiffness of left shoulder, not elsewhere classified: Secondary | ICD-10-CM | POA: Diagnosis not present

## 2017-03-05 DIAGNOSIS — G8929 Other chronic pain: Secondary | ICD-10-CM

## 2017-03-05 DIAGNOSIS — M6281 Muscle weakness (generalized): Secondary | ICD-10-CM

## 2017-03-05 NOTE — Therapy (Addendum)
[place], [place], [address] Phone: [phone]   Fax:  [phone]  Physical Therapy Treatment  Patient Details  Name: Kimberly Jackson MRN: [id number] Date of Birth: 22-Dec-1945 Referring Provider: Dr Lynne Leader   Encounter Date: 03/05/2017      PT End of Session - 03/05/17 0842    Visit Number 10   Number of Visits 12   Date for PT Re-Evaluation 04/04/17   PT Start Time 0845   PT Stop Time 0941   PT Time Calculation (min) 56 min   Activity Tolerance Patient tolerated treatment well      Past Medical History:  Diagnosis Date  . Alcohol abuse   . COPD (chronic obstructive pulmonary disease) ([place])   . Depression   . Reflux     Past Surgical History:  Procedure Laterality Date  . ABDOMINAL HYSTERECTOMY    . APPENDECTOMY     71 yo  . BLADDER SURGERY  2015  . CHOLECYSTECTOMY    . REPLACEMENT TOTAL KNEE    . ROTATOR CUFF REPAIR    . TONSILLECTOMY      There were no vitals filed for this visit.      Subjective Assessment - 03/05/17 0845    Subjective Kimberly Jackson reports that she still has not had the mamogram. Now scheduled for the [facility] tomorrow. Shoulder is irritated today. 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    Currently in Pain? Yes   Pain Score 2    Pain Location Shoulder   Pain Orientation Left   Pain Type Chronic pain   Pain Onset More than a month ago   Pain Frequency Intermittent            [facility] PT Assessment - 03/05/17 0001      Assessment   Medical Diagnosis chronic Lt shoulder pain   Referring Provider Dr Lynne Leader    Onset Date/Surgical Date 09/12/16   Hand Dominance Right   Next MD Visit 02/27/17   Prior Therapy not for Lt shoulder     AROM   Left Shoulder Extension 52 Degrees   Left Shoulder Flexion 128 Degrees   Left Shoulder ABduction 135 Degrees     Palpation   Palpation comment tightness Lt pecs; upper trap; teres;  medial scapular musculature                     [facility] Adult PT Treatment/Exercise - 03/05/17 0001      Shoulder Exercises: Standing   External Rotation Strengthening;Left;10 reps;Theraband   Theraband Level (Shoulder External Rotation) Level 2 (Red)   Extension Strengthening;Both;15 reps;Theraband   Theraband Level (Shoulder Extension) Level 3 (Green)   Extension Limitations AAROM extension with cane x 5    Row Strengthening;Both;15 reps;Theraband   Theraband Level (Shoulder Row) Level 3 (Green)   Row Limitations step back with red TB x 10    Retraction Strengthening;15 reps;Theraband   Theraband Level (Shoulder Retraction) Level 2 (Red)   Other Standing Exercises scap squeeze 10 sec x 10 with noodle    Other Standing Exercises W's with noodle x 10      Shoulder Exercises: Pulleys   Flexion --  10 sec x 10 reps    ABduction --  10 sec x 10      Shoulder Exercises: Therapy Ball   Other Therapy Ball Exercises scapular depression pressing into ball 5 sec x 10 reps x 2  sets      Moist Heat Therapy   Number Minutes Moist Heat 15 Minutes   Moist Heat Location Shoulder  Lt     Manual Therapy   Manual Therapy Soft tissue mobilization   Manual therapy comments pt supine    Joint Mobilization Grade II/III mobs Lt GH joint with circumduction of joint for capsular stretching    Soft tissue mobilization upper trap; leveator; medial scapular border; teres/lats; pecs    Scapular Mobilization Lt scap protraction and depression - moving along thoracic wall    Passive ROM Lt shoulder flexion; abduction; ER; IR; extension; horizontal abductoin                       PT Long Term Goals - 2017/03/28 0844      PT LONG TERM GOAL #1   Title I with advanced HEP (04/04/17)    Time 12   Period Weeks   Status On-going     PT LONG TERM GOAL #2   Title improve Lt shoulder ROM to within 10 degrees of Rt to increase ease of reaching  ( 04/04/17)    Time 12   Period Weeks    Status On-going     PT LONG TERM GOAL #3   Title increase strength Lt shoulder =/> 5-/5 to assist with daily activities ( 04/04/17)    Time 12   Period Weeks   Status On-going     PT LONG TERM GOAL #4   Title improve FOTO =/< 36% limited, CJ level ( 04/04/17)    Time 12   Period Weeks   Status On-going     PT LONG TERM GOAL #5   Title report decrease Lt shoulder pain to allow her to sleep per her previous level ( 02/21/17)    Time 6   Period Weeks   Status On-going               Plan - 28-Mar-2017 0857    Clinical Impression Statement Patient has flare up of Lt shoudler pain today likely related to increase in activity for Lt UE. Note increased muscular tightness through [employer]. Continued gradual progress.    Rehab Potential Good   PT Frequency 2x / week   PT Duration 12 weeks   PT Treatment/Interventions Moist Heat;Ultrasound;Therapeutic exercise;Dry needling;Taping;Vasopneumatic Device;Manual techniques;Neuromuscular re-education;Cryotherapy;Electrical Stimulation;Iontophoresis 4mg /ml Dexamethasone;Patient/family education   PT Next Visit Plan continue joint mobs; PROM; stretching to tolerance; posterior shoulder girdle strengthening; manual work and modalities as indicated Focus on AAROM-PROM all planes with manual work and passive stretching; continue Radiation protection practitioner and Agree with Plan of Care Patient      Patient will benefit from skilled therapeutic intervention in order to improve the following deficits and impairments:  Decreased range of motion, Impaired UE functional use, Increased muscle spasms, Pain, Decreased strength, Postural dysfunction, Improper body mechanics, Decreased activity tolerance  Visit Diagnosis: Stiffness of left shoulder, not elsewhere classified  Abnormal posture  Muscle weakness (generalized)  Chronic left shoulder pain       [facility] PT PB G-CODES - March 28, 2017 1121    Functional Assessment Tool Used  FOTO and professional  judgement   Functional Limitations Carrying, moving and handling objects   Carrying, Moving and Handling Objects Current Status (L9379) At least 40 percent but less than 60 percent impaired, limited or restricted   Carrying, Moving and Handling Objects Goal Status (K2409) At least 20 percent but less than 40 percent impaired,  limited or restricted      Problem List Patient Active Problem List   Diagnosis Date Noted  . Left shoulder pain 12/09/2016  . OSA and COPD overlap syndrome ([place]) 07/05/2016  . Right foot pain 12/07/2015  . Vitamin D deficiency 09/11/2015  . Prediabetes 09/08/2015  . Osteopenia 09/08/2015  . Ventral hernia 09/08/2015  . COPD (chronic obstructive pulmonary disease) ([place]) 09/04/2015  . Paroxysmal A-fib ([place]) 09/04/2015  . HTN (hypertension) 09/04/2015  . Long-term use of high-risk medication 09/04/2015    Dmauri Rosenow Nilda Simmer PT, MPH  03/05/2017, [place], [place], [address] Phone: [phone]   Fax:  [phone]  Name: Kimberly Jackson MRN: [id number] Date of Birth: Feb 02, 1946

## 2017-03-06 ENCOUNTER — Ambulatory Visit
Admission: RE | Admit: 2017-03-06 | Discharge: 2017-03-06 | Disposition: A | Discharge status: Home / Self Care | Payer: Medicare Other | Source: Ambulatory Visit | Attending: Osteopathic Medicine | Admitting: Osteopathic Medicine

## 2017-03-06 ENCOUNTER — Telehealth: Payer: Self-pay

## 2017-03-06 ENCOUNTER — Other Ambulatory Visit: Payer: Self-pay | Admitting: Osteopathic Medicine

## 2017-03-06 DIAGNOSIS — R922 Inconclusive mammogram: Secondary | ICD-10-CM | POA: Diagnosis not present

## 2017-03-06 DIAGNOSIS — N6312 Unspecified lump in the right breast, upper inner quadrant: Secondary | ICD-10-CM | POA: Diagnosis not present

## 2017-03-06 DIAGNOSIS — N6311 Unspecified lump in the right breast, upper outer quadrant: Secondary | ICD-10-CM | POA: Diagnosis not present

## 2017-03-06 DIAGNOSIS — N6489 Other specified disorders of breast: Secondary | ICD-10-CM | POA: Diagnosis not present

## 2017-03-06 DIAGNOSIS — N6310 Unspecified lump in the right breast, unspecified quadrant: Secondary | ICD-10-CM

## 2017-03-06 NOTE — Telephone Encounter (Signed)
Verbal order given to breast center.  They will need a new order placed for bilateral breast ultrasound. -EH/RMA

## 2017-03-10 ENCOUNTER — Other Ambulatory Visit: Payer: Medicare Other

## 2017-03-10 NOTE — Telephone Encounter (Signed)
Called [facility] and was advised that no alternate or additional orders need to be placed.

## 2017-03-12 ENCOUNTER — Ambulatory Visit (INDEPENDENT_AMBULATORY_CARE_PROVIDER_SITE_OTHER): Payer: Medicare Other | Admitting: Physical Therapy

## 2017-03-12 ENCOUNTER — Encounter: Payer: Self-pay | Admitting: Physical Therapy

## 2017-03-12 DIAGNOSIS — G8929 Other chronic pain: Secondary | ICD-10-CM | POA: Diagnosis not present

## 2017-03-12 DIAGNOSIS — M25512 Pain in left shoulder: Secondary | ICD-10-CM | POA: Diagnosis not present

## 2017-03-12 DIAGNOSIS — M25612 Stiffness of left shoulder, not elsewhere classified: Secondary | ICD-10-CM | POA: Diagnosis not present

## 2017-03-12 DIAGNOSIS — R293 Abnormal posture: Secondary | ICD-10-CM

## 2017-03-12 DIAGNOSIS — M6281 Muscle weakness (generalized): Secondary | ICD-10-CM | POA: Diagnosis not present

## 2017-03-12 NOTE — Therapy (Signed)
[place], [place], [address] Phone: [phone]   Fax:  [phone]  Physical Therapy Treatment  Patient Details  Name: Kimberly Jackson MRN: [id number] Date of Birth: September 04, 1945 Referring Provider: Dr Lynne Leader   Encounter Date: 03/12/2017      PT End of Session - 03/12/17 1144    Visit Number 11   Number of Visits 15   Date for PT Re-Evaluation 04/04/17   PT Start Time 5102   PT Stop Time 1247   PT Time Calculation (min) 62 min   Activity Tolerance Patient tolerated treatment well      Past Medical History:  Diagnosis Date  . Alcohol abuse   . COPD (chronic obstructive pulmonary disease) ([place])   . Depression   . Reflux     Past Surgical History:  Procedure Laterality Date  . ABDOMINAL HYSTERECTOMY    . APPENDECTOMY     71 yo  . BLADDER SURGERY  2015  . CHOLECYSTECTOMY    . REPLACEMENT TOTAL KNEE    . ROTATOR CUFF REPAIR    . TONSILLECTOMY      There were no vitals filed for this visit.      Subjective Assessment - 03/12/17 1147    Subjective Kimberly Jackson reports she woke up sore today.  Last week she helped her daughter move something and thinks this may have flared things up some.  Currently performing doorway stretch, stretching behind her head, and the bands for scapular retractions   Pertinent History osteopenia, RTC repair Rt 5-6 yrs ago, Lt TKA 5-6 yrs , had mamaogram and [place] and the lump in her breast is benign   Diagnostic tests x-rays - no fractures, old h/o clavicle fx   Patient Stated Goals tolerate the pain more and perform daily activities without pain   Currently in Pain? Yes   Pain Score 2   5/10 this AM   Pain Location Shoulder   Pain Orientation Left   Pain Descriptors / Indicators Aching;Dull   Pain Type Chronic pain   Pain Onset More than a month ago   Pain Frequency Intermittent   Aggravating Factors  lifting something heavier, and sleeping on it sometimes.    Pain Relieving Factors resting            [facility] PT Assessment - 03/12/17 0001      Assessment   Medical Diagnosis chronic Lt shoulder pain     AROM   Left Shoulder Extension 47 Degrees   Left Shoulder Flexion 135 Degrees   Left Shoulder ABduction 145 Degrees   Left Shoulder Internal Rotation 38 Degrees   Left Shoulder External Rotation 88 Degrees                     [facility] Adult PT Treatment/Exercise - 03/12/17 0001      Shoulder Exercises: Sidelying   External Rotation Strengthening;Left;15 reps;Weights  2 sets, towel under elbow, focus eccentric   External Rotation Weight (lbs) 2   Other Sidelying Exercises 2x10 empty can in small ROM with 1#     Shoulder Exercises: Pulleys   Flexion --  10x10sec   ABduction --  10x 10sec     Shoulder Exercises: Stretch   Internal Rotation Stretch 10 seconds  10 reps with strap behind back   Other Shoulder Stretches bilat shoulder flex in supine with yoga strap   Other Shoulder Stretches pec stretch in door with strap bilat and single.  Modalities   Modalities Moist Heat     Moist Heat Therapy   Number Minutes Moist Heat 15 Minutes   Moist Heat Location Shoulder     Manual Therapy   Manual Therapy Joint mobilization;Soft tissue mobilization;Passive ROM   Joint Mobilization grade III mobs Lt shoulder into ER   Soft tissue mobilization Lt pecs, sub scap, teres minor/major.    Passive ROM Lt shoulder flexion, ER, abdcution, arm pull                      PT Long Term Goals - 03/12/17 1207      PT LONG TERM GOAL #1   Title I with advanced HEP (04/04/17)    Status On-going     PT LONG TERM GOAL #2   Title improve Lt shoulder ROM to within 10 degrees of Rt to increase ease of reaching  ( 04/04/17)      PT LONG TERM GOAL #3   Title increase strength Lt shoulder =/> 5-/5 to assist with daily activities ( 04/04/17)    Status Partially Met     PT LONG TERM GOAL #4   Title improve FOTO =/< 36%  limited, CJ level ( 04/04/17)    Status On-going     PT LONG TERM GOAL #5   Title report decrease Lt shoulder pain to allow her to sleep per her previous level ( 02/21/17)    Status On-going               Plan - 03/12/17 1242    Clinical Impression Statement Kimberly Jackson demo'd improved Lt shoulder motion today.  She did have some increase in pain this am however has been using the arm more.  She was tight and tender in the Lt subscapularis,this decreased with manual work.  Slow progress to goals.    Rehab Potential Good   PT Frequency 2x / week   PT Duration 12 weeks   PT Treatment/Interventions Moist Heat;Ultrasound;Therapeutic exercise;Dry needling;Taping;Vasopneumatic Device;Manual techniques;Neuromuscular re-education;Cryotherapy;Electrical Stimulation;Iontophoresis 109m/ml Dexamethasone;Patient/family education   PT Next Visit Plan assess response to adding in some RTC strengthening at last visit.    Consulted and Agree with Plan of Care Family member/caregiver;Patient   Family Member Consulted husband      Patient will benefit from skilled therapeutic intervention in order to improve the following deficits and impairments:  Decreased range of motion, Impaired UE functional use, Increased muscle spasms, Pain, Decreased strength, Postural dysfunction, Improper body mechanics, Decreased activity tolerance  Visit Diagnosis: Stiffness of left shoulder, not elsewhere classified  Abnormal posture  Muscle weakness (generalized)  Chronic left shoulder pain     Problem List Patient Active Problem List   Diagnosis Date Noted  . Left shoulder pain 12/09/2016  . OSA and COPD overlap syndrome ([place] 07/05/2016  . Right foot pain 12/07/2015  . Vitamin D deficiency 09/11/2015  . Prediabetes 09/08/2015  . Osteopenia 09/08/2015  . Ventral hernia 09/08/2015  . COPD [place]ch[place] obstructive pulmonary disease) ([place] 09/04/2015  . Paroxysmal A-fib ([place] 09/04/2015  . HTN (hypertension)  09/04/2015  . Long-term use of high-risk medication 09/04/2015    SJeral PinchPT  03/12/2017, 12:44 PM  [facility] [place] [address]Phone: [phone]  Fax:  [phone] Name: Kimberly NeedlesMRN: [id number] of Birth: 405/20/1947

## 2017-03-14 ENCOUNTER — Ambulatory Visit (INDEPENDENT_AMBULATORY_CARE_PROVIDER_SITE_OTHER): Payer: Medicare Other | Admitting: Physical Therapy

## 2017-03-14 ENCOUNTER — Encounter: Payer: Self-pay | Admitting: Physical Therapy

## 2017-03-14 DIAGNOSIS — R293 Abnormal posture: Secondary | ICD-10-CM

## 2017-03-14 DIAGNOSIS — G8929 Other chronic pain: Secondary | ICD-10-CM | POA: Diagnosis not present

## 2017-03-14 DIAGNOSIS — M6281 Muscle weakness (generalized): Secondary | ICD-10-CM

## 2017-03-14 DIAGNOSIS — M25612 Stiffness of left shoulder, not elsewhere classified: Secondary | ICD-10-CM

## 2017-03-14 DIAGNOSIS — M25512 Pain in left shoulder: Secondary | ICD-10-CM | POA: Diagnosis not present

## 2017-03-14 NOTE — Therapy (Signed)
[place], [place], [address] Phone: [phone]   Fax:  [phone]  Physical Therapy Treatment  Patient Details  Name: Kimberly Jackson MRN: [id number] Date of Birth: 1945/08/05 Referring Provider: Dr Lynne Leader   Encounter Date: 03/14/2017      PT End of Session - 03/14/17 1019    Visit Number 12   Number of Visits 15   Date for PT Re-Evaluation 04/04/17   PT Start Time 1020   PT Stop Time 1104  heat at end   PT Time Calculation (min) 44 min      Past Medical History:  Diagnosis Date  . Alcohol abuse   . COPD (chronic obstructive pulmonary disease) ([employer])   . Depression   . Reflux     Past Surgical History:  Procedure Laterality Date  . ABDOMINAL HYSTERECTOMY    . APPENDECTOMY     71 yo  . BLADDER SURGERY  2015  . CHOLECYSTECTOMY    . REPLACEMENT TOTAL KNEE    . ROTATOR CUFF REPAIR    . TONSILLECTOMY      There were no vitals filed for this visit.      Subjective Assessment - 03/14/17 1024    Subjective Kimberly Jackson reports she was a little sore when she left treatment last visit however it didn't last long.  Felt a little pain with upper body dressing this AM, didn't last long.    Patient Stated Goals tolerate the pain more and perform daily activities without pain   Currently in Pain? No/denies                         [facility] Adult PT Treatment/Exercise - 03/14/17 0001      Shoulder Exercises: Seated   External Rotation Strengthening;Both;Theraband  30 reps, physical cues for scap engagement   Other Seated Exercises 3x10 full can to 90 degrees with 1# wts for first set.      Shoulder Exercises: Sidelying   External Rotation Strengthening;Left;20 reps;Weights  towel under arm focus on eccentric control   External Rotation Weight (lbs) 2   Other Sidelying Exercises 2x10 Lt shoulder horizontal abduction with small trunk rotation using red band.      Shoulder  Exercises: Pulleys   Flexion 2 minutes   ABduction 2 minutes     Shoulder Exercises: Stretch   Other Shoulder Stretches 3x20sec chest stretch with arms behind back. posterior shoudler stretch and tricep stretch.      Modalities   Modalities Moist Heat     Moist Heat Therapy   Number Minutes Moist Heat 15 Minutes   Moist Heat Location Shoulder  left                     PT Long Term Goals - 03/12/17 1207      PT LONG TERM GOAL #1   Title I with advanced HEP (04/04/17)    Status On-going     PT LONG TERM GOAL #2   Title improve Lt shoulder ROM to within 10 degrees of Rt to increase ease of reaching  ( 04/04/17)      PT LONG TERM GOAL #3   Title increase strength Lt shoulder =/> 5-/5 to assist with daily activities ( 04/04/17)    Status Partially Met     PT LONG TERM GOAL #4   Title improve FOTO =/< 36% limited, CJ level ( 04/04/17)  Status On-going     PT LONG TERM GOAL #5   Title report decrease Lt shoulder pain to allow her to sleep per her previous level ( 02/21/17)    Status On-going               Plan - 03/14/17 1055    Clinical Impression Statement Kimberly Jackson tolerated  increased strengthening exercise well.  Manual therapy held as she has no pain and is doing well.  Will see how she responds to this over the next week.    Rehab Potential Good   PT Frequency 2x / week   PT Duration 12 weeks   PT Treatment/Interventions Moist Heat;Ultrasound;Therapeutic exercise;Dry needling;Taping;Vasopneumatic Device;Manual techniques;Neuromuscular re-education;Cryotherapy;Electrical Stimulation;Iontophoresis 57m/ml Dexamethasone;Patient/family education   PT Next Visit Plan advance HEP and assess response to not having manual work.    Consulted and Agree with Plan of Care Family member/caregiver;Patient   Family Member Consulted husband      Patient will benefit from skilled therapeutic intervention in order to improve the following deficits and impairments:   Decreased range of motion, Impaired UE functional use, Increased muscle spasms, Pain, Decreased strength, Postural dysfunction, Improper body mechanics, Decreased activity tolerance  Visit Diagnosis: Stiffness of left shoulder, not elsewhere classified  Abnormal posture  Muscle weakness (generalized)  Chronic left shoulder pain     Problem List Patient Active Problem List   Diagnosis Date Noted  . Left shoulder pain 12/09/2016  . OSA and COPD overlap syndrome ([place] 07/05/2016  . Right foot pain 12/07/2015  . Vitamin D deficiency 09/11/2015  . Prediabetes 09/08/2015  . Osteopenia 09/08/2015  . Ventral hernia 09/08/2015  . COPD (chronic obstructive pulmonary disease) ([place] 09/04/2015  . Paroxysmal A-fib ([place] 09/04/2015  . HTN (hypertension) 09/04/2015  . Long-term use of high-risk medication 09/04/2015    SJeral PinchPT  03/14/2017, 11:05 AM  [facility] [place] [address]Phone: [phone]  Fax:  [phone] Name: Kimberly GravattMRN: [id number] of Birth: 4May 01, 1947

## 2017-03-17 ENCOUNTER — Emergency Department (INDEPENDENT_AMBULATORY_CARE_PROVIDER_SITE_OTHER): Payer: Medicare Other

## 2017-03-17 ENCOUNTER — Emergency Department (INDEPENDENT_AMBULATORY_CARE_PROVIDER_SITE_OTHER)
Admission: EM | Admit: 2017-03-17 | Discharge: 2017-03-17 | Disposition: A | Discharge status: Home / Self Care | Payer: Medicare Other | Source: Home / Self Care | Attending: Family Medicine | Admitting: Family Medicine

## 2017-03-17 ENCOUNTER — Encounter: Payer: Self-pay | Admitting: *Deleted

## 2017-03-17 DIAGNOSIS — S9032XA Contusion of left foot, initial encounter: Secondary | ICD-10-CM

## 2017-03-17 DIAGNOSIS — M7732 Calcaneal spur, left foot: Secondary | ICD-10-CM

## 2017-03-17 NOTE — Discharge Instructions (Signed)
Apply ice pack for 20 to 30 minutes, 3 to 4 times daily  Continue until pain and swelling decrease.  Elevate foot.  Wear ace wrap until swelling resolves.  May take Tylenol for pain.  Begin range of motion and stretching exercises as tolerated.

## 2017-03-17 NOTE — ED Triage Notes (Signed)
Patient reports while vacuuming a wooden sign fell and hit her on the back left side of her head, left shoulder and left foot. Bruise to let foot. Applied ice. Denies loss of consciousness.

## 2017-03-17 NOTE — ED Provider Notes (Signed)
[name]    CSN: [id number] Arrival date & time: 03/17/17  1623     History   Chief Complaint Chief Complaint  Patient presents with  . Foot Pain  . Head Injury    HPI Kimberly Jackson is a 71 y.o. female.   While vacuuming 3 hours ago, a light wooden sign over a door fell, striking a glancing blow on patient's left head and shoulder.  The sign then landed on the dorsum of her left foot.  She notes minimal pain on her left head and shoulder, and complains primarily of pain in her left dorsal foot.  No loss of consciousness.   The history is provided by the patient and the spouse.  Foot Injury  Location:  Foot Time since incident:  3 hours Injury: yes   Mechanism of injury comment:  Wooden sign fell on foot Foot location:  L foot Pain details:    Quality:  Aching   Radiates to:  Does not radiate   Severity:  Mild   Onset quality:  Sudden   Duration:  3 hours   Timing:  Constant   Progression:  Unchanged Chronicity:  New Dislocation: no   Foreign body present:  No foreign bodies Prior injury to area:  No Relieved by:  Nothing Worsened by:  Bearing weight Ineffective treatments:  Ice Associated symptoms: stiffness and swelling   Associated symptoms: no decreased ROM, no muscle weakness, no neck pain, no numbness and no tingling   Head Injury  Location:  L parietal and L temporal Time since incident:  3 hours Mechanism of injury comment:  Light wood sign struck head Pain details:    Quality:  Aching   Severity:  Mild   Duration:  3 hours   Timing:  Constant   Progression:  Improving Chronicity:  New Relieved by:  Nothing Exacerbated by: palpation. Ineffective treatments:  None tried Associated symptoms: no blurred vision, no disorientation, no double vision, no focal weakness, no headaches, no hearing loss, no loss of consciousness, no memory loss, no nausea, no neck pain, no numbness, no seizures, no tinnitus and no vomiting   Risk factors:  being elderly     Past Medical History:  Diagnosis Date  . Alcohol abuse   . COPD (chronic obstructive pulmonary disease) ([place])   . Depression   . Reflux     Patient Active Problem List   Diagnosis Date Noted  . Left shoulder pain 12/09/2016  . OSA and COPD overlap syndrome ([place]) 07/05/2016  . Right foot pain 12/07/2015  . Vitamin D deficiency 09/11/2015  . Prediabetes 09/08/2015  . Osteopenia 09/08/2015  . Ventral hernia 09/08/2015  . COPD (chronic obstructive pulmonary disease) ([place]) 09/04/2015  . Paroxysmal A-fib ([place]) 09/04/2015  . HTN (hypertension) 09/04/2015  . Long-term use of high-risk medication 09/04/2015    Past Surgical History:  Procedure Laterality Date  . ABDOMINAL HYSTERECTOMY    . APPENDECTOMY     71 yo  . BLADDER SURGERY  2015  . CHOLECYSTECTOMY    . REPLACEMENT TOTAL KNEE    . ROTATOR CUFF REPAIR    . TONSILLECTOMY      OB History    No data available       Home Medications    Prior to Admission medications   Medication Sig Start Date End Date Taking? Authorizing Provider  AMBULATORY NON FORMULARY MEDICATION Blood pressure monitor use daily for hypertension. 02/19/16   Gregor Hams, MD  Cholecalciferol (VITAMIN  D3) 5000 units CAPS Take 5,000 Units by mouth daily.    [provider]  diclofenac sodium (VOLTAREN) 1 % GEL Apply 2 g topically 4 (four) times daily. To affected joint. 01/19/16   Gregor Hams, MD  DIGOX 125 MCG tablet TAKE 1 TABLET BY MOUTH DAILY 02/26/17   Gregor Hams, MD  fluticasone [facility]) 50 MCG/ACT nasal spray SPRAY 1 SPRAY IN EACH NOSTRILS BID 09/01/15   [provider]  fluticasone-salmeterol (ADVAIR HFA) 115-21 MCG/ACT inhaler Inhale 2 puffs into the lungs 2 (two) times daily. 09/04/15   Gregor Hams, MD  Ipratropium-Albuterol (COMBIVENT) 20-100 MCG/ACT AERS respimat Inhale 1 puff into the lungs every 6 (six) hours as needed for wheezing or shortness of breath. 09/04/15   Gregor Hams, MD    ipratropium-albuterol (DUONEB) 0.5-2.5 (3) MG/3ML SOLN Take 3 mLs by nebulization every 6 (six) hours as needed. 09/04/15   Gregor Hams, MD  lisinopril (PRINIVIL,ZESTRIL) 10 MG tablet Take 1 tablet (10 mg total) by mouth daily. 07/05/16   Gregor Hams, MD  metoprolol succinate (TOPROL-XL) 100 MG 24 hr tablet Take 1 tablet (100 mg total) by mouth daily. Take with or immediately following a meal. 02/06/17   Gregor Hams, MD  omeprazole (PRILOSEC) 40 MG capsule Take 1 capsule (40 mg total) by mouth daily. 02/27/17   Gregor Hams, MD  triamterene-hydrochlorothiazide (MAXZIDE-25) 37.5-25 MG tablet Take 1 tablet by mouth daily. 07/05/16   Gregor Hams, MD    Family History Family History  Problem Relation Age of Onset  . Alcohol abuse Mother     Social History Social History  Substance Use Topics  . Smoking status: Never Smoker  . Smokeless tobacco: Never Used  . Alcohol use No     Allergies   Cobalt-mn [manganese]; Compazine [prochlorperazine]; Corticosteroids; Eggs or egg-derived products; and Penicillins   Review of Systems Review of Systems  HENT: Negative for hearing loss and tinnitus.   Eyes: Negative for blurred vision and double vision.  Gastrointestinal: Negative for nausea and vomiting.  Musculoskeletal: Positive for stiffness. Negative for neck pain.  Neurological: Negative for focal weakness, seizures, loss of consciousness, numbness and headaches.  Psychiatric/Behavioral: Negative for memory loss.     Physical Exam Triage Vital Signs ED Triage Vitals [03/17/17 1704]  Enc Vitals Group     BP (!) 150/79     Pulse Rate (!) 58     Resp      Temp      Temp src      SpO2 99 %     Weight 178 lb (80.7 kg)     Height      Head Circumference      Peak Flow      Pain Score 4     Pain Loc      Pain Edu?      Excl. in [place]?    No data found.   Updated Vital Signs BP (!) 150/79 (BP Location: Left Arm)   Pulse (!) 58   Wt 178 lb (80.7 kg)   SpO2 99%   Visual  Acuity Right Eye Distance:   Left Eye Distance:   Bilateral Distance:    Right Eye Near:   Left Eye Near:    Bilateral Near:     Physical Exam  Constitutional: She appears well-developed and well-nourished. No distress.  HENT:  Head:    Right Ear: External ear normal.  Left Ear: External ear normal.  Nose: Nose normal.  Mouth/Throat: Oropharynx is clear and moist.  Mild tenderness left temporal/parietal area, but no swelling or ecchymosis.  Eyes: Pupils are equal, round, and reactive to light. Conjunctivae and EOM are normal.  Neck: Normal range of motion.  No tenderness to palpation.  Cardiovascular: Normal heart sounds.   Pulmonary/Chest: Breath sounds normal.  Musculoskeletal: She exhibits no edema.       Left shoulder: She exhibits tenderness. She exhibits normal range of motion, no bony tenderness, no swelling, no effusion, no crepitus and no deformity.       Arms:      Left foot: There is tenderness, bony tenderness and swelling. There is normal range of motion, normal capillary refill, no crepitus, no deformity and no laceration.       Feet:  Mild tenderness to palpation over left shoulder as noted on diagram, but no swelling or ecchymosis.  Left shoulder has decreased range of motion (which is normal for patient).   Left foot has swelling, tenderness to palpation, and ecchymosis dorsally over the 4th and 5th metatarsals as noted on diagram.   Neurological: She is alert. No cranial nerve deficit or sensory deficit.  Skin: Skin is warm and dry.  Nursing note and vitals reviewed.    UC Treatments / Results  Labs (all labs ordered are listed, but only abnormal results are displayed) Labs Reviewed - No data to display  EKG  EKG Interpretation None       Radiology Dg Foot Complete Left  Result Date: 03/17/2017 CLINICAL DATA:  LEFT foot pain at the fourth and fifth metatarsals since a wood sign fell on her foot today, bruising and swelling, initial encounter  EXAM: LEFT FOOT - COMPLETE 3+ VIEW COMPARISON:  None FINDINGS: Osseous demineralization. Mild degenerative changes at first MTP joint. Soft tissue swelling at lateral and dorsal borders of foot at the level of the distal metatarsals. No acute fracture, subluxation or bone destruction. Plantar and Achilles insertion calcaneal spur formation. Scattered intertarsal degenerative changes. IMPRESSION: Scattered degenerative changes and calcaneal spurring. No acute bony abnormalities. Electronically Signed   By: Lavonia Dana M.D.   On: 03/17/2017 17:56    Procedures Procedures (including critical care time)  Medications Ordered in UC Medications - No data to display   Initial Impression / Assessment and Plan / UC Course  I have reviewed the triage vital signs and the nursing notes.  Pertinent labs & imaging results that were available during my care of the patient were reviewed by me and considered in my medical decision making (see chart for details).    No significant head injury noted. Ace wrap applied to left foot. Apply ice pack for 20 to 30 minutes, 3 to 4 times daily  Continue until pain and swelling decrease.  Elevate foot.  Wear ace wrap until swelling resolves.  May take Tylenol for pain.  Begin foot range of motion and stretching exercises as tolerated. Followup with Dr. Aundria Mems or Dr. Lynne Leader ([facility]) if not improving about two weeks.     Final Clinical Impressions(s) / UC Diagnoses   Final diagnoses:  Contusion of left foot, initial encounter    New Prescriptions New Prescriptions   No medications on file         Kandra Nicolas, MD 03/17/17 1815

## 2017-03-18 ENCOUNTER — Encounter: Payer: Self-pay | Admitting: Physical Therapy

## 2017-03-18 ENCOUNTER — Ambulatory Visit (INDEPENDENT_AMBULATORY_CARE_PROVIDER_SITE_OTHER): Payer: Medicare Other | Admitting: Physical Therapy

## 2017-03-18 DIAGNOSIS — R293 Abnormal posture: Secondary | ICD-10-CM | POA: Diagnosis not present

## 2017-03-18 DIAGNOSIS — M25512 Pain in left shoulder: Secondary | ICD-10-CM

## 2017-03-18 DIAGNOSIS — M6281 Muscle weakness (generalized): Secondary | ICD-10-CM | POA: Diagnosis not present

## 2017-03-18 DIAGNOSIS — M25612 Stiffness of left shoulder, not elsewhere classified: Secondary | ICD-10-CM

## 2017-03-18 DIAGNOSIS — G8929 Other chronic pain: Secondary | ICD-10-CM | POA: Diagnosis not present

## 2017-03-18 NOTE — Therapy (Signed)
[place], [place], [address] Phone: [phone]   Fax:  [phone]  Physical Therapy Treatment  Patient Details  Name: Kimberly Jackson MRN: [id number] Date of Birth: 02-07-1946 Referring Provider: Dr Steva Colder  Encounter Date: 03/18/2017      PT End of Session - 03/18/17 0921    Visit Number 13   Number of Visits 15   Date for PT Re-Evaluation 04/04/17   PT Start Time 0921   PT Stop Time 1011   PT Time Calculation (min) 50 min   Activity Tolerance Patient tolerated treatment well      Past Medical History:  Diagnosis Date  . Alcohol abuse   . COPD (chronic obstructive pulmonary disease) ([place])   . Depression   . Reflux     Past Surgical History:  Procedure Laterality Date  . ABDOMINAL HYSTERECTOMY    . APPENDECTOMY     71 yo  . BLADDER SURGERY  2015  . CHOLECYSTECTOMY    . REPLACEMENT TOTAL KNEE    . ROTATOR CUFF REPAIR    . TONSILLECTOMY      There were no vitals filed for this visit.      Subjective Assessment - 03/18/17 0921    Subjective Zigmund Jackson reports she was doing some work at home and a board fell down and hit her on the head, LT shoulder and foot.  WEnt to the urgent care and was dx with a foot contusion, she has it wrapped right now. She did well after the last visit however now is sore through her shoulder from the accident.   Patient Stated Goals tolerate the pain more and perform daily activities without pain   Currently in Pain? Yes   Pain Score 3    Pain Location Shoulder   Pain Orientation Right   Pain Descriptors / Indicators Aching   Pain Type Chronic pain   Pain Onset More than a month ago   Pain Frequency Intermittent   Aggravating Factors  being hit by the item at home   Pain Relieving Factors rest            [facility] PT Assessment - 03/18/17 0001      Assessment   Medical Diagnosis chronic Lt shoulder pain   Referring Provider Dr Steva Colder   Onset  Date/Surgical Date 09/12/16   Hand Dominance Right   Next MD Visit PRN     Strength   Left Shoulder Extension 5/5   Left Shoulder ABduction 4+/5   Left Shoulder Internal Rotation 5/5   Left Shoulder External Rotation 5/5                     [facility] Adult PT Treatment/Exercise - 03/18/17 0001      Posture/Postural Control   Posture/Postural Control --  some swelling into the Lt shoulder      Shoulder Exercises: Standing   Other Standing Exercises 10 reps BWD shoulder shrugs     Shoulder Exercises: Pulleys   Flexion 2 minutes   ABduction 2 minutes     Shoulder Exercises: Stretch   Other Shoulder Stretches 3x30 sec upper trap pressure stretch with strap.    Other Shoulder Stretches neck rotation stretches 3x30sec biceps and chest stretch with arm straight bilat.      Modalities   Modalities Cryotherapy     Moist Heat Therapy   Number Minutes Moist Heat 12 Minutes   Moist Heat Location Shoulder  Lt and Lt foot      Manual Therapy   Manual Therapy Soft tissue mobilization   Soft tissue mobilization STM to Lt upper trap, levator, supraspinatus and pecs.  Tight with trigger points in the levator.                      PT Long Term Goals - 03/12/17 1207      PT LONG TERM GOAL #1   Title I with advanced HEP (04/04/17)    Status On-going     PT LONG TERM GOAL #2   Title improve Lt shoulder ROM to within 10 degrees of Rt to increase ease of reaching  ( 04/04/17)      PT LONG TERM GOAL #3   Title increase strength Lt shoulder =/> 5-/5 to assist with daily activities ( 04/04/17)    Status Partially Met     PT LONG TERM GOAL #4   Title improve FOTO =/< 36% limited, CJ level ( 04/04/17)    Status On-going     PT LONG TERM GOAL #5   Title report decrease Lt shoulder pain to allow her to sleep per her previous level ( 02/21/17)    Status On-going               Plan - 03/18/17 0929    Clinical Impression Statement Zigmund Jackson is sore today from her  accident over the weekend. She is achy all over from being jarred and hit on the head.  Tolerated lighter exercise and manual work to help loosen her back up.  No new goals met.    Rehab Potential Good   PT Frequency 2x / week   PT Duration 12 weeks   PT Treatment/Interventions Moist Heat;Ultrasound;Therapeutic exercise;Dry needling;Taping;Vasopneumatic Device;Manual techniques;Neuromuscular re-education;Cryotherapy;Electrical Stimulation;Iontophoresis 9m/ml Dexamethasone;Patient/family education   PT Next Visit Plan if patient is doing better, return to ther ex.  She will be out of town next week and return after Labor day.    Consulted and Agree with Plan of Care Family member/caregiver;Patient   Family Member Consulted husband      Patient will benefit from skilled therapeutic intervention in order to improve the following deficits and impairments:  Decreased range of motion, Impaired UE functional use, Increased muscle spasms, Pain, Decreased strength, Postural dysfunction, Improper body mechanics, Decreased activity tolerance  Visit Diagnosis: Stiffness of left shoulder, not elsewhere classified  Abnormal posture  Muscle weakness (generalized)  Chronic left shoulder pain     Problem List Patient Active Problem List   Diagnosis Date Noted  . Left shoulder pain 12/09/2016  . OSA and COPD overlap syndrome ([place] 07/05/2016  . Right foot pain 12/07/2015  . Vitamin D deficiency 09/11/2015  . Prediabetes 09/08/2015  . Osteopenia 09/08/2015  . Ventral hernia 09/08/2015  . COPD (chronic obstructive pulmonary disease) ([place] 09/04/2015  . Paroxysmal A-fib ([place] 09/04/2015  . HTN (hypertension) 09/04/2015  . Long-term use of high-risk medication 09/04/2015    SJeral Jackson  03/18/2017, 10:07 AM  [facility] [place] [address]Phone: [phone]  Fax:  [phone] Name: Kimberly CorriherMRN:  [id number] of Birth: [birth year]

## 2017-03-20 ENCOUNTER — Ambulatory Visit (INDEPENDENT_AMBULATORY_CARE_PROVIDER_SITE_OTHER): Payer: Medicare Other | Admitting: Physical Therapy

## 2017-03-20 DIAGNOSIS — R293 Abnormal posture: Secondary | ICD-10-CM

## 2017-03-20 DIAGNOSIS — G8929 Other chronic pain: Secondary | ICD-10-CM

## 2017-03-20 DIAGNOSIS — M25512 Pain in left shoulder: Secondary | ICD-10-CM

## 2017-03-20 DIAGNOSIS — M25612 Stiffness of left shoulder, not elsewhere classified: Secondary | ICD-10-CM

## 2017-03-20 DIAGNOSIS — M6281 Muscle weakness (generalized): Secondary | ICD-10-CM

## 2017-03-20 NOTE — Therapy (Signed)
[place], [place], [address] Phone: [phone]   Fax:  [phone]  Physical Therapy Treatment  Patient Details  Name: Kimberly Jackson MRN: [id number] Date of Birth: 1946-02-08 Referring Provider: Dr Steva Colder  Encounter Date: 03/20/2017      PT End of Session - 03/20/17 1054    Visit Number 14   Number of Visits 15   Date for PT Re-Evaluation 04/04/17   PT Start Time 8466   PT Stop Time 1110   PT Time Calculation (min) 55 min   Activity Tolerance Patient tolerated treatment well   Behavior During Therapy [facility] for tasks assessed/performed      Past Medical History:  Diagnosis Date  . Alcohol abuse   . COPD (chronic obstructive pulmonary disease) ([place])   . Depression   . Reflux     Past Surgical History:  Procedure Laterality Date  . ABDOMINAL HYSTERECTOMY    . APPENDECTOMY     71 yo  . BLADDER SURGERY  2015  . CHOLECYSTECTOMY    . REPLACEMENT TOTAL KNEE    . ROTATOR CUFF REPAIR    . TONSILLECTOMY      There were no vitals filed for this visit.      Subjective Assessment - 03/20/17 1017    Subjective Lt shoulder is feeling better from last session.     Patient Stated Goals tolerate the pain more and perform daily activities without pain   Currently in Pain? No/denies                         [facility] Adult PT Treatment/Exercise - 03/20/17 1018      Shoulder Exercises: Supine   Protraction Left;20 reps;Weights   Protraction Weight (lbs) 2#   Flexion Left;20 reps   Shoulder Flexion Weight (lbs) 2#     Shoulder Exercises: Sidelying   External Rotation Strengthening;Left;20 reps;Weights   External Rotation Weight (lbs) 2   External Rotation Limitations cues to decreased elbow extension   ABduction Left;20 reps;Weights   ABduction Weight (lbs) 2#   ABduction Limitations to 90 degrees only     Shoulder Exercises: Standing   External Rotation  Strengthening;Left;20 reps;Theraband   Theraband Level (Shoulder External Rotation) Level 2 (Red)   Internal Rotation Left;20 reps;Theraband   Theraband Level (Shoulder Internal Rotation) Level 2 (Red)   Retraction Strengthening;20 reps;Theraband   Theraband Level (Shoulder Retraction) Level 2 (Red)   Retraction Limitations 5 second hold     Shoulder Exercises: Pulleys   Flexion 2 minutes   ABduction 2 minutes     Modalities   Modalities Cryotherapy     Cryotherapy   Number Minutes Cryotherapy 15 Minutes   Cryotherapy Location Shoulder   Type of Cryotherapy Ice pack     Manual Therapy   Manual Therapy Soft tissue mobilization;Passive ROM   Manual therapy comments pt supine    Soft tissue mobilization STM to Lt upper trap, levator, supraspinatus and pecs.  Tight with trigger points in the levator.    Passive ROM Lt shoulder flexion, ER, abdcution, arm pull                      PT Long Term Goals - 03/12/17 1207      PT LONG TERM GOAL #1   Title I with advanced HEP (04/04/17)    Status On-going     PT LONG TERM GOAL #2  Title improve Lt shoulder ROM to within 10 degrees of Rt to increase ease of reaching  ( 04/04/17)      PT LONG TERM GOAL #3   Title increase strength Lt shoulder =/> 5-/5 to assist with daily activities ( 04/04/17)    Status Partially Met     PT LONG TERM GOAL #4   Title improve FOTO =/< 36% limited, CJ level ( 04/04/17)    Status On-going     PT LONG TERM GOAL #5   Title report decrease Lt shoulder pain to allow her to sleep per her previous level ( 02/21/17)    Status On-going               Plan - 03/20/17 1055    Clinical Impression Statement Pt reports decreased pain from last session and tolerated strengthening exercises well today.  Progressing towards goals.   PT Treatment/Interventions Moist Heat;Ultrasound;Therapeutic exercise;Dry needling;Taping;Vasopneumatic Device;Manual techniques;Neuromuscular  re-education;Cryotherapy;Electrical Stimulation;Iontophoresis 34m/ml Dexamethasone;Patient/family education   PT Next Visit Plan continue strengthening, will need renewal v/s d/c next 1-2 visits   Consulted and Agree with Plan of Care Family member/caregiver;Patient   Family Member Consulted husband      Patient will benefit from skilled therapeutic intervention in order to improve the following deficits and impairments:  Decreased range of motion, Impaired UE functional use, Increased muscle spasms, Pain, Decreased strength, Postural dysfunction, Improper body mechanics, Decreased activity tolerance  Visit Diagnosis: Stiffness of left shoulder, not elsewhere classified  Abnormal posture  Muscle weakness (generalized)  Chronic left shoulder pain     Problem List Patient Active Problem List   Diagnosis Date Noted  . Left shoulder pain 12/09/2016  . OSA and COPD overlap syndrome ([place] 07/05/2016  . Right foot pain 12/07/2015  . Vitamin D deficiency 09/11/2015  . Prediabetes 09/08/2015  . Osteopenia 09/08/2015  . Ventral hernia 09/08/2015  . COPD (chronic obstructive pulmonary disease) ([place] 09/04/2015  . Paroxysmal A-fib ([place] 09/04/2015  . HTN (hypertension) 09/04/2015  . Long-term use of high-risk medication 09/04/2015      SLaureen Abrahams PT, DPT 03/20/17 10:57 AM    [facility] [address]Phone: [phone]  Fax:  [phone] Name: Kimberly PoundsMRN: [id number] of Birth: 4April 25, 1947

## 2017-03-25 ENCOUNTER — Other Ambulatory Visit: Payer: Self-pay | Admitting: Family Medicine

## 2017-03-27 ENCOUNTER — Encounter: Payer: Self-pay | Admitting: Rehabilitative and Restorative Service Providers"

## 2017-03-27 ENCOUNTER — Ambulatory Visit (INDEPENDENT_AMBULATORY_CARE_PROVIDER_SITE_OTHER): Payer: Medicare Other | Admitting: Rehabilitative and Restorative Service Providers"

## 2017-03-27 DIAGNOSIS — G8929 Other chronic pain: Secondary | ICD-10-CM | POA: Diagnosis not present

## 2017-03-27 DIAGNOSIS — R293 Abnormal posture: Secondary | ICD-10-CM | POA: Diagnosis not present

## 2017-03-27 DIAGNOSIS — M25512 Pain in left shoulder: Secondary | ICD-10-CM

## 2017-03-27 DIAGNOSIS — M6281 Muscle weakness (generalized): Secondary | ICD-10-CM | POA: Diagnosis not present

## 2017-03-27 DIAGNOSIS — M25612 Stiffness of left shoulder, not elsewhere classified: Secondary | ICD-10-CM

## 2017-03-27 NOTE — Therapy (Signed)
[place], [place], [address] Phone: [phone]   Fax:  [phone]  Physical Therapy Treatment  Patient Details  Name: Kimberly Jackson MRN: [id number] Date of Birth: Sep 19, 1945 Referring Provider: Dr Lynne Leader  Encounter Date: 03/27/2017      PT End of Session - 03/27/17 1022    Visit Number 15   Number of Visits 18   Date for PT Re-Evaluation 05/02/17   PT Start Time 1016   PT Stop Time 1105   PT Time Calculation (min) 49 min   Activity Tolerance Patient tolerated treatment well      Past Medical History:  Diagnosis Date  . Alcohol abuse   . COPD (chronic obstructive pulmonary disease) ([place])   . Depression   . Reflux     Past Surgical History:  Procedure Laterality Date  . ABDOMINAL HYSTERECTOMY    . APPENDECTOMY     71 yo  . BLADDER SURGERY  2015  . CHOLECYSTECTOMY    . REPLACEMENT TOTAL KNEE    . ROTATOR CUFF REPAIR    . TONSILLECTOMY      There were no vitals filed for this visit.      Subjective Assessment - 03/27/17 1030    Subjective Lt shoulder is feeling tight and hurting more. She was out of town for several days and has not done exercises as consistently. She also feels that sitting in the car riding pushes her shoulders forward and irritates the shoulder    Currently in Pain? Yes   Pain Score 2    Pain Location Shoulder   Pain Orientation Left   Pain Descriptors / Indicators Aching   Pain Type Chronic pain   Pain Onset More than a month ago   Pain Frequency Intermittent   Aggravating Factors  lifting arm    Pain Relieving Factors rest             [facility] PT Assessment - 03/27/17 0001      Assessment   Medical Diagnosis chronic Lt shoulder pain   Referring Provider Dr Lynne Leader   Onset Date/Surgical Date 09/12/16   Hand Dominance Right   Next MD Visit PRN     AROM   Left Shoulder Extension 52 Degrees   Left Shoulder Flexion 135 Degrees   Left  Shoulder ABduction 137 Degrees   Left Shoulder Internal Rotation 36 Degrees   Left Shoulder External Rotation 87 Degrees     Strength   Left Shoulder Extension 5/5   Left Shoulder ABduction --  5-/5   Left Shoulder Internal Rotation 5/5   Left Shoulder External Rotation --  5-/5                     [facility] Adult PT Treatment/Exercise - 03/27/17 0001      Shoulder Exercises: Sidelying   External Rotation Strengthening;Left;20 reps;Weights   External Rotation Weight (lbs) 2   ABduction Left;20 reps;Weights   ABduction Weight (lbs) 2#   ABduction Limitations to 90 degrees only     Shoulder Exercises: Pulleys   Flexion 2 minutes   ABduction 2 minutes     Moist Heat Therapy   Number Minutes Moist Heat 15 Minutes   Moist Heat Location Shoulder  Lt     Manual Therapy   Manual therapy comments pt supine    Joint Mobilization grade III mobs Lt shoulder into ER   Soft tissue mobilization STM to Lt upper trap,  levator, supraspinatus and pecs.  Tight with trigger points in the levator.    Scapular Mobilization Lt scap protraction and depression - moving along thoracic wall    Passive ROM Lt shoulder flexion, abdcution, ER, IR - UE traction with arm pull                      PT Long Term Goals - 03/27/17 1023      PT LONG TERM GOAL #1   Title I with advanced HEP (05/02/17)    Time 16   Period Weeks   Status Revised     PT LONG TERM GOAL #2   Title improve Lt shoulder ROM to within 10 degrees of Rt to increase ease of reaching  (04/1217)    Time 16   Period Weeks   Status Revised     PT LONG TERM GOAL #3   Title increase strength Lt shoulder =/> 5-/5 to assist with daily activities ( 05/02/17)    Time 16   Period Weeks   Status Revised     PT LONG TERM GOAL #4   Title improve FOTO =/< 36% limited, CJ level ( 05/02/17)    Time 16   Period Weeks   Status Revised     PT LONG TERM GOAL #5   Title report decrease Lt shoulder pain to allow her to  sleep per her previous level (05/02/17)    Time 16   Period Weeks   Status Revised               Plan - 03/27/17 1026    Clinical Impression Statement [name] continues to report improvement in Lt shoulder following re-injury. She demonstrates improving posture and alignment; gradual increase in ROM, strength and function. She will benefit from continued PT to achieve maximum rehab potential and accomplish goals of therapy.    Rehab Potential Good   PT Frequency 1x / week   PT Duration Other (comment)  16 weeks    PT Treatment/Interventions Moist Heat;Ultrasound;Therapeutic exercise;Dry needling;Taping;Vasopneumatic Device;Manual techniques;Neuromuscular re-education;Cryotherapy;Electrical Stimulation;Iontophoresis 4mg /ml Dexamethasone;Patient/family education   PT Next Visit Plan continue strengthening, stretching; manual wor; ROM 1x/wk x 3 weeks    Consulted and Agree with Plan of Care Patient      Patient will benefit from skilled therapeutic intervention in order to improve the following deficits and impairments:  Decreased range of motion, Impaired UE functional use, Increased muscle spasms, Pain, Decreased strength, Postural dysfunction, Improper body mechanics, Decreased activity tolerance  Visit Diagnosis: Stiffness of left shoulder, not elsewhere classified  Abnormal posture  Muscle weakness (generalized)  Chronic left shoulder pain     Problem List Patient Active Problem List   Diagnosis Date Noted  . Left shoulder pain 12/09/2016  . OSA and COPD overlap syndrome ([place]) 07/05/2016  . Right foot pain 12/07/2015  . Vitamin D deficiency 09/11/2015  . Prediabetes 09/08/2015  . Osteopenia 09/08/2015  . Ventral hernia 09/08/2015  . COPD (chronic obstructive pulmonary disease) ([place]) 09/04/2015  . Paroxysmal A-fib ([place]) 09/04/2015  . HTN (hypertension) 09/04/2015  . Long-term use of high-risk medication 09/04/2015    Ryian Lynde Nilda Simmer PT, MPH  03/27/2017, 12:00  PM  [facility] [place], [place], [address] Phone: [phone]   Fax:  [phone]  Name: Calyssa Zobrist MRN: [id number] Date of Birth: Dec 26, 1945

## 2017-04-03 ENCOUNTER — Encounter: Payer: Self-pay | Admitting: Rehabilitative and Restorative Service Providers"

## 2017-04-03 ENCOUNTER — Ambulatory Visit (INDEPENDENT_AMBULATORY_CARE_PROVIDER_SITE_OTHER): Payer: Medicare Other | Admitting: Rehabilitative and Restorative Service Providers"

## 2017-04-03 DIAGNOSIS — M6281 Muscle weakness (generalized): Secondary | ICD-10-CM

## 2017-04-03 DIAGNOSIS — G8929 Other chronic pain: Secondary | ICD-10-CM | POA: Diagnosis not present

## 2017-04-03 DIAGNOSIS — R293 Abnormal posture: Secondary | ICD-10-CM

## 2017-04-03 DIAGNOSIS — M25512 Pain in left shoulder: Secondary | ICD-10-CM | POA: Diagnosis not present

## 2017-04-03 DIAGNOSIS — M25612 Stiffness of left shoulder, not elsewhere classified: Secondary | ICD-10-CM | POA: Diagnosis present

## 2017-04-03 NOTE — Therapy (Signed)
[place], [place], [address] Phone: [phone]   Fax:  [phone]  Physical Therapy Treatment  Patient Details  Name: Kimberly Jackson MRN: [id number] Date of Birth: 06-03-1946 Referring Provider: Dr Lynne Leader  Encounter Date: 04/03/2017      PT End of Session - 04/03/17 1059    Visit Number 16   Number of Visits 18   Date for PT Re-Evaluation 05/02/17   PT Start Time 1058   PT Stop Time 1145   PT Time Calculation (min) 47 min   Activity Tolerance Patient tolerated treatment well      Past Medical History:  Diagnosis Date  . Alcohol abuse   . COPD (chronic obstructive pulmonary disease) ([place])   . Depression   . Reflux     Past Surgical History:  Procedure Laterality Date  . ABDOMINAL HYSTERECTOMY    . APPENDECTOMY     71 yo  . BLADDER SURGERY  2015  . CHOLECYSTECTOMY    . REPLACEMENT TOTAL KNEE    . ROTATOR CUFF REPAIR    . TONSILLECTOMY      There were no vitals filed for this visit.      Subjective Assessment - 04/03/17 1141    Subjective Doing some "deep cleaning" at home in the past couple of days. Shoulder is sore and hurting some this week - kept her awake some Tuesday night.    Currently in Pain? Yes   Pain Score 4    Pain Location Shoulder   Pain Orientation Left   Pain Descriptors / Indicators Aching;Nagging   Pain Type Chronic pain   Pain Onset More than a month ago                         [facility] Adult PT Treatment/Exercise - 04/03/17 0001      Shoulder Exercises: Seated   Other Seated Exercises working non scapular retraction sliding hand along the thigh      Shoulder Exercises: Sidelying   External Rotation Strengthening;Left;20 reps;Weights   External Rotation Weight (lbs) 2     Shoulder Exercises: Standing   External Rotation Strengthening;Left;20 reps;Theraband   Theraband Level (Shoulder External Rotation) Level 2 (Red)   Internal  Rotation Left;20 reps;Theraband   Theraband Level (Shoulder Internal Rotation) Level 2 (Red)   Retraction Strengthening;20 reps;Theraband   Theraband Level (Shoulder Retraction) Level 2 (Red)   Other Standing Exercises step back with TB row      Shoulder Exercises: Therapy Ball   Other Therapy Ball Exercises scapular depression pressing into ball 5 sec x 10 reps x 2 sets      Moist Heat Therapy   Number Minutes Moist Heat 15 Minutes   Moist Heat Location Shoulder  Lt     Manual Therapy   Manual therapy comments pt supine    Joint Mobilization grade III mobs Lt shoulder into ER   Soft tissue mobilization STM to Lt upper trap, levator, supraspinatus and pecs.  Tight with trigger points in the levator.    Scapular Mobilization Lt scap protraction and depression - moving along thoracic wall    Passive ROM Lt shoulder flexion, abdcution, ER, IR - UE traction with arm pull                      PT Long Term Goals - 04/03/17 1059      PT LONG TERM GOAL #1  Title I with advanced HEP (05/02/17)    Time 16   Period Weeks   Status On-going     PT LONG TERM GOAL #2   Title improve Lt shoulder ROM to within 10 degrees of Rt to increase ease of reaching  (04/1217)    Time 16   Period Weeks   Status On-going     PT LONG TERM GOAL #3   Title increase strength Lt shoulder =/> 5-/5 to assist with daily activities ( 05/02/17)    Time 16   Period Weeks   Status On-going     PT LONG TERM GOAL #4   Title improve FOTO =/< 36% limited, CJ level ( 05/02/17)    Time 16   Period Weeks   Status On-going     PT LONG TERM GOAL #5   Title report decrease Lt shoulder pain to allow her to sleep per her previous level (05/02/17)    Time 16   Period Weeks   Status On-going               Plan - 04/03/17 1145    Clinical Impression Statement Flare up of symptoms with increased activity. Increased palpable tightness Lt shoulder girdle. Some improvement with manual work.    Rehab Potential Good   PT Frequency 1x / week   PT Duration Other (comment)  16 weeks    PT Treatment/Interventions Moist Heat;Ultrasound;Therapeutic exercise;Dry needling;Taping;Vasopneumatic Device;Manual techniques;Neuromuscular re-education;Cryotherapy;Electrical Stimulation;Iontophoresis 4mg /ml Dexamethasone;Patient/family education   PT Next Visit Plan continue strengthening, stretching; manual wor; ROM 1x/wk x 3 weeks    Consulted and Agree with Plan of Care Patient      Patient will benefit from skilled therapeutic intervention in order to improve the following deficits and impairments:  Decreased range of motion, Impaired UE functional use, Increased muscle spasms, Pain, Decreased strength, Postural dysfunction, Improper body mechanics, Decreased activity tolerance  Visit Diagnosis: Stiffness of left shoulder, not elsewhere classified  Abnormal posture  Muscle weakness (generalized)  Chronic left shoulder pain     Problem List Patient Active Problem List   Diagnosis Date Noted  . Left shoulder pain 12/09/2016  . OSA and COPD overlap syndrome ([place]) 07/05/2016  . Right foot pain 12/07/2015  . Vitamin D deficiency 09/11/2015  . Prediabetes 09/08/2015  . Osteopenia 09/08/2015  . Ventral hernia 09/08/2015  . COPD (chronic obstructive pulmonary disease) ([place]) 09/04/2015  . Paroxysmal A-fib ([place]) 09/04/2015  . HTN (hypertension) 09/04/2015  . Long-term use of high-risk medication 09/04/2015    Celyn Nilda Simmer PT, MPH  04/03/2017, 11:47 AM  [facility] [place], [place], [address] Phone: [phone]   Fax:  [phone]  Name: Kimberly Jackson MRN: [id number] Date of Birth: [birth year]

## 2017-04-10 ENCOUNTER — Encounter: Payer: Self-pay | Admitting: Rehabilitative and Restorative Service Providers"

## 2017-04-10 ENCOUNTER — Ambulatory Visit (INDEPENDENT_AMBULATORY_CARE_PROVIDER_SITE_OTHER): Payer: Medicare Other | Admitting: Rehabilitative and Restorative Service Providers"

## 2017-04-10 DIAGNOSIS — M25512 Pain in left shoulder: Secondary | ICD-10-CM

## 2017-04-10 DIAGNOSIS — G8929 Other chronic pain: Secondary | ICD-10-CM

## 2017-04-10 DIAGNOSIS — M6281 Muscle weakness (generalized): Secondary | ICD-10-CM

## 2017-04-10 DIAGNOSIS — M25612 Stiffness of left shoulder, not elsewhere classified: Secondary | ICD-10-CM | POA: Diagnosis not present

## 2017-04-10 DIAGNOSIS — R293 Abnormal posture: Secondary | ICD-10-CM | POA: Diagnosis not present

## 2017-04-10 NOTE — Therapy (Signed)
[place], [place], [address] Phone: [phone]   Fax:  [phone]  Physical Therapy Treatment  Patient Details  Name: Kimberly Jackson MRN: [id number] Date of Birth: 1946-05-29 Referring Provider: Dr Lynne Leader  Encounter Date: 04/10/2017      PT End of Session - 04/10/17 1452    Visit Number 17   Number of Visits 18   Date for PT Re-Evaluation 05/02/17   PT Start Time 5631   PT Stop Time 1533   PT Time Calculation (min) 42 min   Activity Tolerance Patient tolerated treatment well      Past Medical History:  Diagnosis Date  . Alcohol abuse   . COPD (chronic obstructive pulmonary disease) ([place])   . Depression   . Reflux     Past Surgical History:  Procedure Laterality Date  . ABDOMINAL HYSTERECTOMY    . APPENDECTOMY     71 yo  . BLADDER SURGERY  2015  . CHOLECYSTECTOMY    . REPLACEMENT TOTAL KNEE    . ROTATOR CUFF REPAIR    . TONSILLECTOMY      There were no vitals filed for this visit.      Subjective Assessment - 04/10/17 1454    Subjective Continues to do "deep cleaning" this week. shoyulder is doing pretty good.    Currently in Pain? No/denies                         [facility] Adult PT Treatment/Exercise - 04/10/17 0001      Neuro Re-ed    Neuro Re-ed Details  working on posture and alignment engaging posterior shoudler girdle      Shoulder Exercises: Seated   Other Seated Exercises working on scapular retraction sliding hand along the thigh      Shoulder Exercises: Sidelying   External Rotation Strengthening;Left;20 reps;Weights   External Rotation Weight (lbs) 2     Shoulder Exercises: Standing   External Rotation Strengthening;Left;20 reps;Theraband   Theraband Level (Shoulder External Rotation) Level 2 (Red)   Internal Rotation Left;20 reps;Theraband   Theraband Level (Shoulder Internal Rotation) Level 2 (Red)   Row Strengthening;Both;15  reps;Theraband   Theraband Level (Shoulder Row) Level 3 (Green)   Retraction Strengthening;20 reps;Theraband   Theraband Level (Shoulder Retraction) Level 2 (Red)   Other Standing Exercises step back with TB row      Shoulder Exercises: Pulleys   Flexion 2 minutes   ABduction 2 minutes     Moist Heat Therapy   Number Minutes Moist Heat 15 Minutes   Moist Heat Location Shoulder  Lt     Manual Therapy   Manual therapy comments pt supine    Joint Mobilization grade III mobs Lt shoulder into ER   Soft tissue mobilization STM to Lt upper trap, levator, supraspinatus and pecs.  Tight with trigger points in the levator.    Scapular Mobilization Lt scap protraction and depression - moving along thoracic wall    Passive ROM Lt shoulder flexion, abdcution, ER, IR - UE traction with arm pull       Running late for appt due to traffic problems.                PT Long Term Goals - 04/10/17 1501      PT LONG TERM GOAL #1   Title I with advanced HEP (05/02/17)    Time 16   Period Weeks   Status  On-going     PT LONG TERM GOAL #2   Title improve Lt shoulder ROM to within 10 degrees of Rt to increase ease of reaching  (04/1217)    Time 16   Period Weeks   Status On-going     PT LONG TERM GOAL #3   Title increase strength Lt shoulder =/> 5-/5 to assist with daily activities ( 05/02/17)    Time 16   Period Weeks   Status On-going     PT LONG TERM GOAL #4   Title improve FOTO =/< 36% limited, CJ level ( 05/02/17)    Time 16   Period Weeks   Status On-going     PT LONG TERM GOAL #5   Title report decrease Lt shoulder pain to allow her to sleep per her previous level (05/02/17)    Time 16   Period Weeks   Status On-going               Plan - 04/10/17 1459    Clinical Impression Statement Good progress with shoulder this week even through she has incresed the demand on shoulder with her "deep cleaning". There continues to be increased palpable tightness through  the anterior Lt shoulder girdle. Good ROM.    Rehab Potential Good   PT Frequency 1x / week   PT Duration Other (comment)   PT Treatment/Interventions Moist Heat;Ultrasound;Therapeutic exercise;Dry needling;Taping;Vasopneumatic Device;Manual techniques;Neuromuscular re-education;Cryotherapy;Electrical Stimulation;Iontophoresis 4mg /ml Dexamethasone;Patient/family education   PT Next Visit Plan continue strengthening, stretching; manual wor; ROM 1x/wk x1 week   Consulted and Agree with Plan of Care Patient      Patient will benefit from skilled therapeutic intervention in order to improve the following deficits and impairments:  Decreased range of motion, Impaired UE functional use, Increased muscle spasms, Pain, Decreased strength, Postural dysfunction, Improper body mechanics, Decreased activity tolerance  Visit Diagnosis: Stiffness of left shoulder, not elsewhere classified  Abnormal posture  Muscle weakness (generalized)  Chronic left shoulder pain     Problem List Patient Active Problem List   Diagnosis Date Noted  . Left shoulder pain 12/09/2016  . OSA and COPD overlap syndrome ([place]) 07/05/2016  . Right foot pain 12/07/2015  . Vitamin D deficiency 09/11/2015  . Prediabetes 09/08/2015  . Osteopenia 09/08/2015  . Ventral hernia 09/08/2015  . COPD (chronic obstructive pulmonary disease) ([place]) 09/04/2015  . Paroxysmal A-fib ([place]) 09/04/2015  . HTN (hypertension) 09/04/2015  . Long-term use of high-risk medication 09/04/2015    Anokhi Shannon Nilda Simmer 04/10/2017, 4:31 PM  [facility] [place], [place], [address] Phone: [phone]   Fax:  [phone]  Name: Amillia Biffle MRN: [id number] Date of Birth: August 14, 1945

## 2017-04-18 ENCOUNTER — Encounter: Payer: Self-pay | Admitting: Rehabilitative and Restorative Service Providers"

## 2017-04-18 ENCOUNTER — Ambulatory Visit (INDEPENDENT_AMBULATORY_CARE_PROVIDER_SITE_OTHER): Payer: Medicare Other | Admitting: Rehabilitative and Restorative Service Providers"

## 2017-04-18 DIAGNOSIS — M25512 Pain in left shoulder: Secondary | ICD-10-CM | POA: Diagnosis not present

## 2017-04-18 DIAGNOSIS — M6281 Muscle weakness (generalized): Secondary | ICD-10-CM

## 2017-04-18 DIAGNOSIS — R293 Abnormal posture: Secondary | ICD-10-CM

## 2017-04-18 DIAGNOSIS — G8929 Other chronic pain: Secondary | ICD-10-CM

## 2017-04-18 DIAGNOSIS — M25612 Stiffness of left shoulder, not elsewhere classified: Secondary | ICD-10-CM | POA: Diagnosis not present

## 2017-04-18 NOTE — Patient Instructions (Signed)
ELBOW: Biceps - Standing    Standing in doorway, place one hand on wall, elbow straight. Lean forward. Hold __20-30_ seconds. _3__ reps per set, _2__ sets per day

## 2017-04-18 NOTE — Therapy (Signed)
[facility] [place], [place], [address] Phone: [phone]   Fax:  [phone]  Physical Therapy Treatment  Patient Details  Name: Kimberly Jackson MRN: [id number] Date of Birth: [birth year] Referring Provider: Dr Lynne Leader   Encounter Date: 04/18/2017      PT End of Session - 04/18/17 1015    Visit Number 18   Number of Visits 18   Date for PT Re-Evaluation 05/02/17   PT Start Time 3818   PT Stop Time 1056   PT Time Calculation (min) 41 min   Activity Tolerance Patient tolerated treatment well      Past Medical History:  Diagnosis Date  . Alcohol abuse   . COPD (chronic obstructive pulmonary disease) ([place])   . Depression   . Reflux     Past Surgical History:  Procedure Laterality Date  . ABDOMINAL HYSTERECTOMY    . APPENDECTOMY     71 yo  . BLADDER SURGERY  2015  . CHOLECYSTECTOMY    . REPLACEMENT TOTAL KNEE    . ROTATOR CUFF REPAIR    . TONSILLECTOMY      There were no vitals filed for this visit.      Subjective Assessment - 04/18/17 1016    Subjective Shoulder pain is worse today. Thinks it may be from caring for her Edd Arbour more in the past week. More lifting. Hot shoulder helps.    Currently in Pain? Yes   Pain Score 3    Pain Location Shoulder   Pain Orientation Left   Pain Descriptors / Indicators Aching;Nagging   Pain Type Chronic pain   Pain Onset More than a month ago   Pain Frequency Intermittent            [facility] PT Assessment - 04/18/17 0001      Assessment   Medical Diagnosis chronic Lt shoulder pain   Referring Provider Dr Lynne Leader    Onset Date/Surgical Date 09/12/16   Hand Dominance Right   Next MD Visit PRN     Observation/Other Assessments   Focus on Therapeutic Outcomes (FOTO)  45% limitation      AROM   Left Shoulder Extension 49 Degrees   Left Shoulder Flexion 133 Degrees   Left Shoulder ABduction 135 Degrees  pain   Left Shoulder Internal  Rotation 25 Degrees   Left Shoulder External Rotation 88 Degrees     Strength   Left Shoulder Extension 5/5   Left Shoulder ABduction --  5-/5   Left Shoulder Internal Rotation 5/5   Left Shoulder External Rotation --  5-/5     Palpation   Palpation comment some persistent tightness Lt pecs; upper trap; teres; medial scapular musculature                     [facility] Adult PT Treatment/Exercise - 04/18/17 0001      Neuro Re-ed    Neuro Re-ed Details  working on posture and alignment engaging posterior shoudler girdle      Shoulder Exercises: Sidelying   External Rotation Strengthening;Left;20 reps;Weights   External Rotation Weight (lbs) 2     Shoulder Exercises: Standing   External Rotation Strengthening;Left;20 reps;Theraband   Theraband Level (Shoulder External Rotation) Level 3 (Green)   Internal Rotation Left;20 reps;Theraband   Theraband Level (Shoulder Internal Rotation) Level 2 (Red)   Row Strengthening;Both;15 reps;Theraband   Theraband Level (Shoulder Row) Level 3 (Green)   Retraction Strengthening;20 reps;Theraband   Theraband  Level (Shoulder Retraction) Level 2 (Red)   Other Standing Exercises step back with TB row    Other Standing Exercises scapsqueeze with noodle x 10      Shoulder Exercises: Pulleys   Flexion 2 minutes     Shoulder Exercises: Stretch   Other Shoulder Stretches three way doorway stretch 30 sec x 3 each position     Moist Heat Therapy   Number Minutes Moist Heat 15 Minutes   Moist Heat Location Shoulder  Lt     Manual Therapy   Manual therapy comments pt supine    Joint Mobilization grade III mobs Lt shoulder into ER   Soft tissue mobilization STM to Lt upper trap, levator, supraspinatus and pecs.  Tight with trigger points in the levator.    Scapular Mobilization Lt scap protraction and depression - moving along thoracic wall    Passive ROM Lt shoulder flexion, abdcution, ER, IR - UE traction with arm pull                  PT Education - 04/18/17 1052    Education provided Yes   Education Details HEP   Person(s) Educated Patient   Methods Explanation;Demonstration;Tactile cues;Verbal cues;Handout   Comprehension Verbalized understanding;Returned demonstration;Verbal cues required;Tactile cues required             PT Long Term Goals - 04/18/17 1056      PT LONG TERM GOAL #1   Title I with advanced HEP (05/02/17)    Time 16   Period Weeks   Status Achieved     PT LONG TERM GOAL #2   Title improve Lt shoulder ROM to within 10 degrees of Rt to increase ease of reaching  (04/1217)    Time 16   Period Weeks   Status Partially Met     PT LONG TERM GOAL #3   Title increase strength Lt shoulder =/> 5-/5 to assist with daily activities ( 05/02/17)    Time 16   Period Weeks   Status Achieved     PT LONG TERM GOAL #4   Title improve FOTO =/< 36% limited, CJ level ( 05/02/17)    Time 16   Period Weeks   Status Not Met     PT LONG TERM GOAL #5   Title report decrease Lt shoulder pain to allow her to sleep per her previous level (05/02/17)    Time 16   Period Weeks   Status Partially Met               Plan - 04/18/17 1057    Clinical Impression Statement Shoulder continues to have some pain and flare ups related to activities. She is independent in HEP - needs to continue with exercise on an ongoing basis. Goals of therapy are partially accomplished.    Rehab Potential Good   PT Frequency 1x / week   PT Duration Other (comment)   PT Treatment/Interventions Moist Heat;Ultrasound;Therapeutic exercise;Dry needling;Taping;Vasopneumatic Device;Manual techniques;Neuromuscular re-education;Cryotherapy;Electrical Stimulation;Iontophoresis 9m/ml Dexamethasone;Patient/family education   PT Next Visit Plan D/C to independent HEP patient will call with any questions or concerns    Consulted and Agree with Plan of Care Patient   Family Member Consulted husband       Patient will benefit from skilled therapeutic intervention in order to improve the following deficits and impairments:  Decreased range of motion, Impaired UE functional use, Increased muscle spasms, Pain, Decreased strength, Postural dysfunction, Improper body mechanics, Decreased activity tolerance  Visit Diagnosis: Stiffness  of left shoulder, not elsewhere classified  Abnormal posture  Muscle weakness (generalized)  Chronic left shoulder pain     Problem List Patient Active Problem List   Diagnosis Date Noted  . Left shoulder pain 12/09/2016  . OSA and COPD overlap syndrome ([place]) 07/05/2016  . Right foot pain 12/07/2015  . Vitamin D deficiency 09/11/2015  . Prediabetes 09/08/2015  . Osteopenia 09/08/2015  . Ventral hernia 09/08/2015  . COPD (chronic obstructive pulmonary disease) ([place]) 09/04/2015  . Paroxysmal A-fib ([place]) 09/04/2015  . HTN (hypertension) 09/04/2015  . Long-term use of high-risk medication 09/04/2015    Griffith Santilli Nilda Simmer PT, MPH  04/18/2017, 11:01 AM  [facility] [place], [place], [address] Phone: [phone]   Fax:  [phone]  Name: Kimberly Jackson MRN: [id number] Date of Birth: 1946/02/19  PHYSICAL THERAPY DISCHARGE SUMMARY  Visits from Start of Care: 18  Current functional level related to goals / functional outcomes: See progress note for discharge status    Remaining deficits: Persistent intermittent pain and limitations    Education / Equipment: HEP  Plan: Patient agrees to discharge.  Patient goals were partially met. Patient is being discharged due to                                                     ?????    Goals are partially accomplished.   Chandon Lazcano P. Helene Kelp PT, MPH 04/18/17 11:02 AM

## 2017-06-09 ENCOUNTER — Ambulatory Visit (INDEPENDENT_AMBULATORY_CARE_PROVIDER_SITE_OTHER): Payer: Medicare Other | Admitting: Family Medicine

## 2017-06-09 ENCOUNTER — Telehealth: Payer: Self-pay | Admitting: Family Medicine

## 2017-06-09 DIAGNOSIS — I10 Essential (primary) hypertension: Secondary | ICD-10-CM

## 2017-06-09 DIAGNOSIS — Z23 Encounter for immunization: Secondary | ICD-10-CM

## 2017-06-09 MED ORDER — TRIAMTERENE-HCTZ 37.5-25 MG PO TABS: 1.0000 | ORAL_TABLET | Freq: Every day | ORAL | 3 refills | Status: DC

## 2017-06-09 MED ORDER — LISINOPRIL 10 MG PO TABS: 10.0000 mg | ORAL_TABLET | Freq: Every day | ORAL | 3 refills | Status: DC

## 2017-06-09 MED ORDER — DICLOFENAC SODIUM 1 % TD GEL: 2.0000 g | Freq: Four times a day (QID) | TRANSDERMAL | 11 refills | Status: DC

## 2017-06-09 MED ORDER — DIGOXIN 125 MCG PO TABS
125.0000 ug | ORAL_TABLET | Freq: Every day | ORAL | 2 refills | Status: DC
Start: 2017-06-09 — End: 2017-10-08

## 2017-06-09 NOTE — Progress Notes (Signed)
Influenza vaccine given

## 2017-06-09 NOTE — Telephone Encounter (Signed)
Meds refilled.  Follow up Feb 2019

## 2017-06-26 ENCOUNTER — Other Ambulatory Visit: Payer: Self-pay

## 2017-08-06 ENCOUNTER — Other Ambulatory Visit: Payer: Self-pay | Admitting: Family Medicine

## 2017-09-01 ENCOUNTER — Ambulatory Visit (INDEPENDENT_AMBULATORY_CARE_PROVIDER_SITE_OTHER): Payer: Medicare Other | Admitting: Family Medicine

## 2017-09-01 ENCOUNTER — Encounter: Payer: Self-pay | Admitting: Family Medicine

## 2017-09-01 ENCOUNTER — Ambulatory Visit (INDEPENDENT_AMBULATORY_CARE_PROVIDER_SITE_OTHER): Payer: Medicare Other

## 2017-09-01 VITALS — BP 139/78 | HR 64 | Ht 64.0 in | Wt 182.0 lb

## 2017-09-01 DIAGNOSIS — M1711 Unilateral primary osteoarthritis, right knee: Secondary | ICD-10-CM | POA: Diagnosis not present

## 2017-09-01 DIAGNOSIS — G8929 Other chronic pain: Secondary | ICD-10-CM | POA: Diagnosis not present

## 2017-09-01 DIAGNOSIS — M25561 Pain in right knee: Secondary | ICD-10-CM

## 2017-09-01 DIAGNOSIS — Z96652 Presence of left artificial knee joint: Secondary | ICD-10-CM | POA: Diagnosis not present

## 2017-09-01 DIAGNOSIS — Z1211 Encounter for screening for malignant neoplasm of colon: Secondary | ICD-10-CM | POA: Diagnosis not present

## 2017-09-01 NOTE — Progress Notes (Signed)
Kimberly Jackson is a 72 y.o. female who presents to [facility] [place] today for new knee pain and follow up left shoulder pain.   Kimberly Jackson was seen last year for shoulder pain. In the interim she has done well and notes that the shoulder pain has completely resolved.  She notes right-sided knee pain. She is a pertinent surgical history for left total knee replacement.  The right knee pain is diffuse and present for at least 6 months worsening with activity. The pain is limiting her ability to exercise normally. She denies locking or catching but does note grinding. She's tried over-the-counter medications for pain which helps some. She does not tolerate steroids well. She has not had much treatment recently.    Past Medical History:  Diagnosis Date  . Alcohol abuse   . COPD (chronic obstructive pulmonary disease) ([place])   . Depression   . Reflux    Past Surgical History:  Procedure Laterality Date  . ABDOMINAL HYSTERECTOMY    . APPENDECTOMY     72 yo  . BLADDER SURGERY  2015  . CHOLECYSTECTOMY    . REPLACEMENT TOTAL KNEE    . ROTATOR CUFF REPAIR    . TONSILLECTOMY     Social History   Tobacco Use  . Smoking status: Never Smoker  . Smokeless tobacco: Never Used  Substance Use Topics  . Alcohol use: No    Alcohol/week: 0.0 oz   family history includes Alcohol abuse in her mother.  ROS as above:  Medications: Current Outpatient Medications  Medication Sig Dispense Refill  . AMBULATORY NON FORMULARY MEDICATION Blood pressure monitor use daily for hypertension. 1 Device 0  . Cholecalciferol (VITAMIN D3) 5000 units CAPS Take 5,000 Units by mouth daily.    . diclofenac sodium (VOLTAREN) 1 % GEL Apply 2 g 4 (four) times daily topically. To affected joint. 100 g 11  . digoxin (DIGOX) 0.125 MG tablet Take 1 tablet (125 mcg total) daily by mouth. 30 tablet 2  .  fluticasone (FLONASE) 50 MCG/ACT nasal spray SPRAY 1 SPRAY IN EACH NOSTRILS BID  0  . fluticasone-salmeterol (ADVAIR HFA) 115-21 MCG/ACT inhaler Inhale 2 puffs into the lungs 2 (two) times daily. 1 Inhaler 6  . Ipratropium-Albuterol (COMBIVENT) 20-100 MCG/ACT AERS respimat Inhale 1 puff into the lungs every 6 (six) hours as needed for wheezing or shortness of breath. 1 Inhaler 5  . ipratropium-albuterol (DUONEB) 0.5-2.5 (3) MG/3ML SOLN Take 3 mLs by nebulization every 6 (six) hours as needed. 360 mL 1  . lisinopril (PRINIVIL,ZESTRIL) 10 MG tablet Take 1 tablet (10 mg total) daily by mouth. 30 tablet 3  . metoprolol succinate (TOPROL-XL) 100 MG 24 hr tablet TAKE 1 TABLET BY MOUTH DAILY. TAKE WITH OR IMMEDIATELY FOLLOWING A MEAL. 90 tablet 1  . omeprazole (PRILOSEC) 40 MG capsule Take 1 capsule (40 mg total) by mouth daily. 90 capsule 3  . triamterene-hydrochlorothiazide (MAXZIDE-25) 37.5-25 MG tablet Take 1 tablet daily by mouth. 30 tablet 3   No current facility-administered medications for this visit.    Allergies  Allergen Reactions  . Cobalt-Mn [Manganese]   . Compazine [Prochlorperazine]   . Corticosteroids   . Eggs Or Egg-Derived Products   . Penicillins     Health Maintenance Health Maintenance  Topic Date Due  . COLONOSCOPY  07/28/2017  . MAMMOGRAM  03/07/2019  . TETANUS/TDAP  11/19/2024  . INFLUENZA VACCINE  Completed  . DEXA SCAN  Completed  . Hepatitis C Screening  Completed  . PNA vac Low Risk Adult  Completed     Exam:  BP 139/78   Pulse 64   Ht 5\' 4"  (1.626 m)   Wt 182 lb (82.6 kg)   BMI 31.24 kg/m  Gen: Well NAD HEENT: EOMI,  MMM Lungs: Normal work of breathing. CTABL Heart: RRR no MRG Abd: NABS, Soft. Nondistended, Nontender Exts: Brisk capillary refill, warm and well perfused.  Right Knee: Mild effusion no erythema Range of motion 0-120. Retropatellar crepitations present with range of motion Tender to palpation lateral joint line. Stable ligamentous  exam  intact flexion and extension strength Antalgic gait   x-ray independently reviewed by myself.  No results found for this or any previous visit (from the past 72 hour(s)). Dg Knee 1-2 Views Left  Result Date: 09/01/2017 CLINICAL DATA:  72 year old female with chronic right knee pain. Left knee for comparison. Initial encounter. EXAM: LEFT KNEE - 1-2 VIEW COMPARISON:  None. FINDINGS: Two frontal views of the left knee reveal prior left knee replacement. Calcification left lateral capsule/ligament incidentally noted. IMPRESSION: Post total left knee replacement. Electronically Signed   By: Kimberly Jackson M.D.   On: 09/01/2017 12:38   Dg Knee Complete 4 Views Right  Result Date: 09/01/2017 CLINICAL DATA:  72 year old female with chronic right knee pain. Left knee for comparison. Initial encounter. EXAM: RIGHT KNEE - COMPLETE 4+ VIEW COMPARISON:  None. FINDINGS: Mild to moderate patellofemoral joint degenerative changes. No fracture or dislocation. Soft tissues unremarkable. IMPRESSION: Mild to moderate patellofemoral joint degenerative changes. Electronically Signed   By: Kimberly Jackson M.D.   On: 09/01/2017 12:40      Assessment and Plan: 72 y.o. female with  Right knee pain likely due to DJD.  Plan for orthovisc or equivalent Hyaluronic acid injection series. Continue diclofenac gel and NSAIDs as needed. Avoid injectable steroids given history of intolerance. Recheck if not improving.  Referral to gastroenterology for colon cancer screening obtained as well.   Orders Placed This Encounter  Procedures  . DG Knee Complete 4 Views Right    Please include patellar sunrise, lateral, and weightbearing bilateral AP and bilateral rosenberg views    Standing Status:   Future    Number of Occurrences:   1    Standing Expiration Date:   11/01/2018    Order Specific Question:   Reason for exam:    Answer:   Please include patellar sunrise, lateral, and weightbearing bilateral AP and bilateral  rosenberg views    Comments:   Please include patellar sunrise, lateral, and weightbearing bilateral AP and bilateral rosenberg views    Order Specific Question:   Preferred imaging location?    Answer:   [facility]  . DG Knee 1-2 Views Left    Standing Status:   Future    Number of Occurrences:   1    Standing Expiration Date:   11/02/2018    Order Specific Question:   Reason for Exam (SYMPTOM  OR DIAGNOSIS REQUIRED)    Answer:   For use with right knee x-ray, bilateral AP and Rosenberg standing.    Order Specific Question:   Preferred imaging location?    Answer:   [facility]  . Ambulatory referral to Gastroenterology    Referral Priority:   Routine    Referral Type:   Consultation    Referral Reason:   Specialty Services Required    Number of Visits Requested:   1  No orders of the defined types were placed in this encounter.    Discussed warning signs or symptoms. Please see discharge instructions. Patient expresses understanding.

## 2017-09-01 NOTE — Patient Instructions (Addendum)
Thank you for coming in today. Get xray now.  We will arrange for the gel shots.  You should hear from [facility] about colon cancer screening.  Follow up in May for well adult medicare visit.

## 2017-09-02 ENCOUNTER — Telehealth: Payer: Self-pay | Admitting: Family Medicine

## 2017-09-02 NOTE — Telephone Encounter (Signed)
Submitted information via OV web site. Awaiting determination.

## 2017-09-02 NOTE — Telephone Encounter (Signed)
-----   Message from Huel Cote, LPN sent at 03/24/2839  3:39 PM EST -----   ----- Message ----- From: Gregor Hams, MD Sent: 09/01/2017   1:30 PM To: Huel Cote, LPN  Prior Margaretann Loveless please

## 2017-09-02 NOTE — Progress Notes (Signed)
Orthovisc

## 2017-09-10 ENCOUNTER — Ambulatory Visit (INDEPENDENT_AMBULATORY_CARE_PROVIDER_SITE_OTHER): Payer: Medicare Other | Admitting: Family Medicine

## 2017-09-10 ENCOUNTER — Encounter: Payer: Self-pay | Admitting: Family Medicine

## 2017-09-10 ENCOUNTER — Telehealth: Payer: Self-pay | Admitting: Family Medicine

## 2017-09-10 VITALS — BP 117/70 | HR 63 | Temp 97.5°F | Wt 179.0 lb

## 2017-09-10 DIAGNOSIS — J0100 Acute maxillary sinusitis, unspecified: Secondary | ICD-10-CM | POA: Diagnosis not present

## 2017-09-10 DIAGNOSIS — M1711 Unilateral primary osteoarthritis, right knee: Secondary | ICD-10-CM | POA: Diagnosis not present

## 2017-09-10 MED ORDER — LEVOFLOXACIN 500 MG PO TABS: 500.0000 mg | ORAL_TABLET | Freq: Every day | ORAL | 0 refills | Status: DC

## 2017-09-10 MED ORDER — IPRATROPIUM BROMIDE 0.06 % NA SOLN: 2.0000 | NASAL | 6 refills | Status: DC | PRN

## 2017-09-10 MED ORDER — GUAIFENESIN-CODEINE 100-10 MG/5ML PO SOLN: 5.0000 mL | Freq: Every evening | ORAL | 0 refills | Status: DC | PRN

## 2017-09-10 MED ORDER — BENZONATATE 200 MG PO CAPS: 200.0000 mg | ORAL_CAPSULE | Freq: Three times a day (TID) | ORAL | 0 refills | Status: DC | PRN

## 2017-09-10 NOTE — Progress Notes (Signed)
Routing to [place] for update.

## 2017-09-10 NOTE — Telephone Encounter (Signed)
Received message from [employer] that Orthovisc was undermined at this time. Please advise.

## 2017-09-10 NOTE — Progress Notes (Deleted)
I just received a denial from [place] for the [place].

## 2017-09-10 NOTE — Patient Instructions (Signed)
Thank you for coming in today. Use the nasal spray and cough medicines as needed.  Fill the Levaquin antibiotics if needed if worse.    Sinusitis, Adult Sinusitis is soreness and inflammation of your sinuses. Sinuses are hollow spaces in the bones around your face. They are located:  Around your eyes.  In the middle of your forehead.  Behind your nose.  In your cheekbones.  Your sinuses and nasal passages are lined with a stringy fluid (mucus). Mucus normally drains out of your sinuses. When your nasal tissues get inflamed or swollen, the mucus can get trapped or blocked so air cannot flow through your sinuses. This lets bacteria, viruses, and funguses grow, and that leads to infection. Follow these instructions at home: Medicines  Take, use, or apply over-the-counter and prescription medicines only as told by your doctor. These may include nasal sprays.  If you were prescribed an antibiotic medicine, take it as told by your doctor. Do not stop taking the antibiotic even if you start to feel better. Hydrate and Humidify  Drink enough water to keep your pee (urine) clear or pale yellow.  Use a cool mist humidifier to keep the humidity level in your home above 50%.  Breathe in steam for 10-15 minutes, 3-4 times a day or as told by your doctor. You can do this in the bathroom while a hot shower is running.  Try not to spend time in cool or dry air. Rest  Rest as much as possible.  Sleep with your head raised (elevated).  Make sure to get enough sleep each night. General instructions  Put a warm, moist washcloth on your face 3-4 times a day or as told by your doctor. This will help with discomfort.  Wash your hands often with soap and water. If there is no soap and water, use hand sanitizer.  Do not smoke. Avoid being around people who are smoking (secondhand smoke).  Keep all follow-up visits as told by your doctor. This is important. Contact a doctor if:  You have a  fever.  Your symptoms get worse.  Your symptoms do not get better within 10 days. Get help right away if:  You have a very bad headache.  You cannot stop throwing up (vomiting).  You have pain or swelling around your face or eyes.  You have trouble seeing.  You feel confused.  Your neck is stiff.  You have trouble breathing. This information is not intended to replace advice given to you by your health care provider. Make sure you discuss any questions you have with your health care provider. Document Released: 12/25/2007 Document Revised: 03/03/2016 Document Reviewed: 05/03/2015 Elsevier Interactive Patient Education  [employer].

## 2017-09-10 NOTE — Progress Notes (Signed)
Kimberly Jackson is a 72 y.o. female who presents to [facility] [place] [facility] today for cough and congestion.  Kimberly Jackson has had symptoms for about a week now.  She notes some nasal discharge as well.  Her husband is sick with a similar illness and her grandson was sick with a similar illness.  She is tried some over-the-counter medicines which have helped.  She denies current fevers or chills.  Knee pain: Kimberly Jackson has right knee arthritis and is trying to get gel shots in the right knee.  We started the process of prior authorizing this at the last visit but she is not heard anything yet. She notes continued knee pain is interfering with her ability to walk normally. Past Medical History:  Diagnosis Date  . Alcohol abuse   . COPD (chronic obstructive pulmonary disease) ([place])   . Depression   . Reflux    Past Surgical History:  Procedure Laterality Date  . ABDOMINAL HYSTERECTOMY    . APPENDECTOMY     72 yo  . BLADDER SURGERY  2015  . CHOLECYSTECTOMY    . REPLACEMENT TOTAL KNEE    . ROTATOR CUFF REPAIR    . TONSILLECTOMY     Social History   Tobacco Use  . Smoking status: Never Smoker  . Smokeless tobacco: Never Used  Substance Use Topics  . Alcohol use: No    Alcohol/week: 0.0 oz   family history includes Alcohol abuse in her mother.  ROS as above:  Medications: Current Outpatient Medications  Medication Sig Dispense Refill  . AMBULATORY NON FORMULARY MEDICATION Blood pressure monitor use daily for hypertension. 1 Device 0  . Cholecalciferol (VITAMIN D3) 5000 units CAPS Take 5,000 Units by mouth daily.    . diclofenac sodium (VOLTAREN) 1 % GEL Apply 2 g 4 (four) times daily topically. To affected joint. 100 g 11  . digoxin (DIGOX) 0.125 MG tablet Take 1 tablet (125 mcg total) daily by mouth. 30 tablet 2  . fluticasone (FLONASE) 50 MCG/ACT nasal spray SPRAY 1 SPRAY IN EACH  NOSTRILS BID  0  . fluticasone-salmeterol (ADVAIR HFA) 115-21 MCG/ACT inhaler Inhale 2 puffs into the lungs 2 (two) times daily. 1 Inhaler 6  . Ipratropium-Albuterol (COMBIVENT) 20-100 MCG/ACT AERS respimat Inhale 1 puff into the lungs every 6 (six) hours as needed for wheezing or shortness of breath. 1 Inhaler 5  . ipratropium-albuterol (DUONEB) 0.5-2.5 (3) MG/3ML SOLN Take 3 mLs by nebulization every 6 (six) hours as needed. 360 mL 1  . lisinopril (PRINIVIL,ZESTRIL) 10 MG tablet Take 1 tablet (10 mg total) daily by mouth. 30 tablet 3  . metoprolol succinate (TOPROL-XL) 100 MG 24 hr tablet TAKE 1 TABLET BY MOUTH DAILY. TAKE WITH OR IMMEDIATELY FOLLOWING A MEAL. 90 tablet 1  . omeprazole (PRILOSEC) 40 MG capsule Take 1 capsule (40 mg total) by mouth daily. 90 capsule 3  . triamterene-hydrochlorothiazide (MAXZIDE-25) 37.5-25 MG tablet Take 1 tablet daily by mouth. 30 tablet 3  . benzonatate (TESSALON) 200 MG capsule Take 1 capsule (200 mg total) by mouth 3 (three) times daily as needed for cough. 45 capsule 0  . guaiFENesin-codeine 100-10 MG/5ML syrup Take 5 mLs by mouth at bedtime as needed for cough. 120 mL 0  . ipratropium (ATROVENT) 0.06 % nasal spray Place 2 sprays into both nostrils every 4 (four) hours as needed for rhinitis. 10 mL 6  . levofloxacin (LEVAQUIN) 500 MG tablet Take 1 tablet (500  mg total) by mouth daily. 7 tablet 0   No current facility-administered medications for this visit.    Allergies  Allergen Reactions  . Cobalt-Mn [Manganese]   . Compazine [Prochlorperazine]   . Corticosteroids   . Eggs Or Egg-Derived Products   . Penicillins     Health Maintenance Health Maintenance  Topic Date Due  . COLONOSCOPY  07/28/2017  . MAMMOGRAM  03/07/2019  . TETANUS/TDAP  11/19/2024  . INFLUENZA VACCINE  Completed  . DEXA SCAN  Completed  . Hepatitis C Screening  Completed  . PNA vac Low Risk Adult  Completed     Exam:  BP 117/70   Pulse 63   Temp (!) 97.5 F (36.4 C)  (Oral)   Wt 179 lb (81.2 kg)   BMI 30.73 kg/m  Gen: Well NAD HEENT: EOMI,  MMM clear nasal discharge.  Normal tympanic membranes bilaterally.  No conjunctival injection.  Posterior pharynx with cobblestoning.  Nontender maxillary and frontal sinuses.  Minimal cervical lymphadenopathy present. Lungs: Normal work of breathing. CTABL Heart: RRR no MRG Abd: NABS, Soft. Nondistended, Nontender Exts: Brisk capillary refill, warm and well perfused.  MSK: Right knee normal motion mild antalgic gait.   Assessment and Plan: 72 y.o. female with viral URI with cough.  Plan on symptomatic management with Atrovent nasal spray codeine cough syrup and Tessalon Perles.  Backup Levaquin printed in case she worsens.  Recheck sooner if needed. New problem uncertain prognosis.  Right knee pain ongoing due to DJD.  Continue Tylenol.  Will inquire about Orthovisc.  Health maintenance: Colonoscopy scheduled for next week.  No orders of the defined types were placed in this encounter.  Meds ordered this encounter  Medications  . ipratropium (ATROVENT) 0.06 % nasal spray    Sig: Place 2 sprays into both nostrils every 4 (four) hours as needed for rhinitis.    Dispense:  10 mL    Refill:  6  . guaiFENesin-codeine 100-10 MG/5ML syrup    Sig: Take 5 mLs by mouth at bedtime as needed for cough.    Dispense:  120 mL    Refill:  0  . benzonatate (TESSALON) 200 MG capsule    Sig: Take 1 capsule (200 mg total) by mouth 3 (three) times daily as needed for cough.    Dispense:  45 capsule    Refill:  0  . levofloxacin (LEVAQUIN) 500 MG tablet    Sig: Take 1 tablet (500 mg total) by mouth daily.    Dispense:  7 tablet    Refill:  0     Discussed warning signs or symptoms. Please see discharge instructions. Patient expresses understanding.

## 2017-09-10 NOTE — Telephone Encounter (Signed)
-----   Message from Huel Cote, LPN sent at 6/77/0340 11:38 AM EST -----   ----- Message ----- From: Gregor Hams, MD Sent: 09/10/2017  11:20 AM To: Huel Cote, LPN  Whats going on with orthovisc?

## 2017-09-10 NOTE — Telephone Encounter (Signed)
I don't understand this message.  

## 2017-09-10 NOTE — Telephone Encounter (Signed)
I am sorry it was supposed to say undetermined at this time. I will check with the insurance and see what he hold up is at this point.

## 2017-09-16 DIAGNOSIS — K5730 Diverticulosis of large intestine without perforation or abscess without bleeding: Secondary | ICD-10-CM | POA: Diagnosis not present

## 2017-09-16 DIAGNOSIS — Z86010 Personal history of colonic polyps: Secondary | ICD-10-CM | POA: Diagnosis not present

## 2017-09-16 DIAGNOSIS — D122 Benign neoplasm of ascending colon: Secondary | ICD-10-CM | POA: Diagnosis not present

## 2017-09-16 LAB — HM COLONOSCOPY

## 2017-09-24 ENCOUNTER — Encounter: Payer: Self-pay | Admitting: Family Medicine

## 2017-09-30 ENCOUNTER — Encounter: Payer: Self-pay | Admitting: Family Medicine

## 2017-09-30 ENCOUNTER — Ambulatory Visit (INDEPENDENT_AMBULATORY_CARE_PROVIDER_SITE_OTHER): Payer: Medicare Other | Admitting: Family Medicine

## 2017-09-30 VITALS — BP 103/61 | HR 90 | Temp 97.8°F | Ht 64.0 in | Wt 168.0 lb

## 2017-09-30 DIAGNOSIS — R1013 Epigastric pain: Secondary | ICD-10-CM | POA: Diagnosis not present

## 2017-09-30 DIAGNOSIS — R1110 Vomiting, unspecified: Secondary | ICD-10-CM | POA: Diagnosis not present

## 2017-09-30 DIAGNOSIS — I878 Other specified disorders of veins: Secondary | ICD-10-CM | POA: Diagnosis not present

## 2017-09-30 LAB — COMPLETE METABOLIC PANEL WITH GFR
AG Ratio: 1.3 (calc) (ref 1.0–2.5)
ALT: 18 U/L (ref 6–29)
AST: 22 U/L (ref 10–35)
Albumin: 4.4 g/dL (ref 3.6–5.1)
Alkaline phosphatase (APISO): 117 U/L (ref 33–130)
BUN/Creatinine Ratio: 14 (calc) (ref 6–22)
BUN: 47 mg/dL — ABNORMAL HIGH (ref 7–25)
CO2: 19 mmol/L — ABNORMAL LOW (ref 20–32)
Calcium: 9.1 mg/dL (ref 8.6–10.4)
Chloride: 100 mmol/L (ref 98–110)
Creat: 3.43 mg/dL — ABNORMAL HIGH (ref 0.60–0.93)
GFR, Est African American: 15 mL/min/{1.73_m2} — ABNORMAL LOW (ref >=60)
GFR, Est Non African American: 13 mL/min/{1.73_m2} — ABNORMAL LOW (ref >=60)
Globulin: 3.5 g/dL (calc) (ref 1.9–3.7)
Glucose, Bld: 101 mg/dL — ABNORMAL HIGH (ref 65–99)
Potassium: 4.2 mmol/L (ref 3.5–5.3)
Sodium: 134 mmol/L — ABNORMAL LOW (ref 135–146)
Total Bilirubin: 0.7 mg/dL (ref 0.2–1.2)
Total Protein: 7.9 g/dL (ref 6.1–8.1)

## 2017-09-30 LAB — CBC
HCT: 45.4 % — ABNORMAL HIGH (ref 35.0–45.0)
Hemoglobin: 15.7 g/dL — ABNORMAL HIGH (ref 11.7–15.5)
MCH: 30 pg (ref 27.0–33.0)
MCHC: 34.6 g/dL (ref 32.0–36.0)
MCV: 86.8 fL (ref 80.0–100.0)
MPV: 10.1 fL (ref 7.5–12.5)
Platelets: 403 10*3/uL — ABNORMAL HIGH (ref 140–400)
RBC: 5.23 10*6/uL — ABNORMAL HIGH (ref 3.80–5.10)
RDW: 13.1 % (ref 11.0–15.0)
WBC: 10.9 10*3/uL — ABNORMAL HIGH (ref 3.8–10.8)

## 2017-09-30 LAB — LIPASE: Lipase: 31 U/L (ref 7–60)

## 2017-09-30 MED ORDER — SODIUM CHLORIDE 0.9 % IV SOLN: 4.0000 mg | Freq: Once | INTRAVENOUS | Status: DC

## 2017-09-30 MED ORDER — ONDANSETRON HCL 4 MG/2ML IJ SOLN
4.0000 mg | Freq: Once | INTRAMUSCULAR | Status: AC
Start: 2017-09-30 — End: 2017-09-30
  Administered 2017-09-30: 4 mg via INTRAVENOUS

## 2017-09-30 NOTE — Progress Notes (Signed)
Kimberly Jackson is a 72 y.o. female who presents to [facility] today for vomiting and diarrhea.  Symptoms have been present now for 3 days.  She feels lightheaded and dizzy.  She notes that she did take her blood pressure medications yesterday but did not today.  She is having trouble keeping fluids down.  She denies any blood in her diarrhea or vomitus.  She notes that she is been putting out less urine than normal.  She has not tried any antivomiting medications.  She notes some mild abdominal soreness as well.  She notes positive sick contacts with similar illness.   Past Medical History:  Diagnosis Date  . Alcohol abuse   . COPD (chronic obstructive pulmonary disease) ([place])   . Depression   . Reflux    Past Surgical History:  Procedure Laterality Date  . ABDOMINAL HYSTERECTOMY    . APPENDECTOMY     72 yo  . BLADDER SURGERY  2015  . CHOLECYSTECTOMY    . REPLACEMENT TOTAL KNEE    . ROTATOR CUFF REPAIR    . TONSILLECTOMY     Social History   Tobacco Use  . Smoking status: Never Smoker  . Smokeless tobacco: Never Used  Substance Use Topics  . Alcohol use: No    Alcohol/week: 0.0 oz   family history includes Alcohol abuse in her mother.  ROS as above: No headache, visual changes, , constipation, dizziness,  skin rash, fevers, chills, night sweats, weight loss, swollen lymph nodes, body aches, joint swelling, muscle aches, chest pain, shortness of breath, mood changes, visual or auditory hallucinations.   Medications: Current Outpatient Medications  Medication Sig Dispense Refill  . AMBULATORY NON FORMULARY MEDICATION Blood pressure monitor use daily for hypertension. 1 Device 0  . Cholecalciferol (VITAMIN D3) 5000 units CAPS Take 5,000 Units by mouth daily.    . diclofenac sodium (VOLTAREN) 1 % GEL Apply 2 g 4 (four) times daily topically. To affected  joint. 100 g 11  . digoxin (DIGOX) 0.125 MG tablet Take 1 tablet (125 mcg total) daily by mouth. 30 tablet 2  . fluticasone (FLONASE) 50 MCG/ACT nasal spray SPRAY 1 SPRAY IN EACH NOSTRILS BID  0  . fluticasone-salmeterol (ADVAIR HFA) 115-21 MCG/ACT inhaler Inhale 2 puffs into the lungs 2 (two) times daily. 1 Inhaler 6  . ipratropium (ATROVENT) 0.06 % nasal spray Place 2 sprays into both nostrils every 4 (four) hours as needed for rhinitis. 10 mL 6  . Ipratropium-Albuterol (COMBIVENT) 20-100 MCG/ACT AERS respimat Inhale 1 puff into the lungs every 6 (six) hours as needed for wheezing or shortness of breath. 1 Inhaler 5  . ipratropium-albuterol (DUONEB) 0.5-2.5 (3) MG/3ML SOLN Take 3 mLs by nebulization every 6 (six) hours as needed. 360 mL 1  . lisinopril (PRINIVIL,ZESTRIL) 10 MG tablet Take 1 tablet (10 mg total) daily by mouth. 30 tablet 3  . metoprolol succinate (TOPROL-XL) 100 MG 24 hr tablet TAKE 1 TABLET BY MOUTH DAILY. TAKE WITH OR IMMEDIATELY FOLLOWING A MEAL. 90 tablet 1  . omeprazole (PRILOSEC) 40 MG capsule Take 1 capsule (40 mg total) by mouth daily. 90 capsule 3  . triamterene-hydrochlorothiazide (MAXZIDE-25) 37.5-25 MG tablet Take 1 tablet daily by mouth. 30 tablet 3   No current facility-administered medications for this visit.    Allergies  Allergen Reactions  . Cobalt-Mn [Manganese]   . Compazine [Prochlorperazine]   . Corticosteroids   . Eggs Or Egg-Derived  Products   . Penicillins     Health Maintenance Health Maintenance  Topic Date Due  . MAMMOGRAM  03/07/2019  . COLONOSCOPY  09/16/2022  . TETANUS/TDAP  11/19/2024  . INFLUENZA VACCINE  Completed  . DEXA SCAN  Completed  . Hepatitis C Screening  Completed  . PNA vac Low Risk Adult  Completed     Exam:  BP 103/61   Pulse 90   Temp 97.8 F (36.6 C) (Oral)   Ht 5\' 4"  (1.626 m)   Wt 168 lb (76.2 kg)   BMI 28.84 kg/m   Post normal saline orthostatic vital signs: Lying 118/72 heart rate 71 Sitting 121/77  heart rate 80 Standing 108/67 heart rate 79  Gen: Well NAD fatigued appearing but nontoxic appearing HEENT: EOMI, his membranes dry Lungs: Normal work of breathing. CTABL Heart: RRR no MRG Abd: NABS, Soft. Nondistended, minimally tender epigastric area with no rebound or guarding.  No masses palpated. Exts: Brisk capillary refill, warm and well perfused.   Patient was given 1 L normal saline IV, and 4 mL of IV Zofran and felt much better   Results for orders placed or performed in visit on 09/30/17 (from the past 72 hour(s))  CBC     Status: Abnormal   Collection Time: 09/30/17 12:00 AM  Result Value Ref Range   WBC 10.9 (H) 3.8 - 10.8 Thousand/uL   RBC 5.23 (H) 3.80 - 5.10 Million/uL   Hemoglobin 15.7 (H) 11.7 - 15.5 g/dL   HCT 45.4 (H) 35.0 - 45.0 %   MCV 86.8 80.0 - 100.0 fL   MCH 30.0 27.0 - 33.0 pg   MCHC 34.6 32.0 - 36.0 g/dL   RDW 13.1 11.0 - 15.0 %   Platelets 403 (H) 140 - 400 Thousand/uL   MPV 10.1 7.5 - 12.5 fL   No results found.    Assessment and Plan: 72 y.o. female with  Vomiting and diarrhea very likely due to viral gastroenteritis.  Differential is broad however in workup proceed as below.  Recheck tomorrow.  Zofran dispensed.  Dehydration: Improved status post 1 L normal saline.  Check metabolic panel and lipase as noted below.  Recheck tomorrow. Present to the emergency room if worsening  Symptoms in the severe range.   Orders Placed This Encounter  Procedures  . CBC  . COMPLETE METABOLIC PANEL WITH GFR  . Lipase   Meds ordered this encounter  Medications  . DISCONTD: ondansetron (ZOFRAN) 4 mg in sodium chloride 0.9 % 50 mL IVPB  . ondansetron (ZOFRAN) injection 4 mg     Discussed warning signs or symptoms. Please see discharge instructions. Patient expresses understanding.

## 2017-09-30 NOTE — Progress Notes (Signed)
  Subjective:    CC: Poor venous access  HPI: This is a pleasant 72 year old female, I am called for help in establishing venous access in a dehydrated patient.  I reviewed the past medical history, family history, social history, surgical history, and allergies today and no changes were needed.  Please see the problem list section below in epic for further details.  Past Medical History: Past Medical History:  Diagnosis Date  . Alcohol abuse   . COPD (chronic obstructive pulmonary disease) ([place])   . Depression   . Reflux    Past Surgical History: Past Surgical History:  Procedure Laterality Date  . ABDOMINAL HYSTERECTOMY    . APPENDECTOMY     72 yo  . BLADDER SURGERY  2015  . CHOLECYSTECTOMY    . REPLACEMENT TOTAL KNEE    . ROTATOR CUFF REPAIR    . TONSILLECTOMY     Social History: Social History   Socioeconomic History  . Marital status: Married    Spouse name: None  . Number of children: None  . Years of education: None  . Highest education level: None  Social Needs  . Financial resource strain: None  . Food insecurity - worry: None  . Food insecurity - inability: None  . Transportation needs - medical: None  . Transportation needs - non-medical: None  Occupational History  . None  Tobacco Use  . Smoking status: Never Smoker  . Smokeless tobacco: Never Used  Substance and Sexual Activity  . Alcohol use: No    Alcohol/week: 0.0 oz  . Drug use: No  . Sexual activity: None  Other Topics Concern  . None  Social History Narrative  . None   Family History: Family History  Problem Relation Age of Onset  . Alcohol abuse Mother    Allergies: Allergies  Allergen Reactions  . Cobalt-Mn [Manganese]   . Compazine [Prochlorperazine]   . Corticosteroids   . Eggs Or Egg-Derived Products   . Penicillins    Medications: See med rec.  Review of Systems: No fevers, chills, night sweats, weight loss, chest pain, or shortness of breath.   Objective:     General: Well Developed, well nourished, and in no acute distress.  Neuro: Alert and oriented x3, extra-ocular muscles intact, sensation grossly intact.  HEENT: Normocephalic, atraumatic, pupils equal round reactive to light, neck supple, no masses, no lymphadenopathy, thyroid nonpalpable.  Skin: Warm and dry, no rashes. Cardiac: Regular rate and rhythm, no murmurs rubs or gallops, no lower extremity edema.  Respiratory: Clear to auscultation bilaterally. Not using accessory muscles, speaking in full sentences.  I placed a 20-gauge angiocatheter in the right cephalic vein at the level of the wrist.  We started normal saline drip IV, I also pushed 8 mg of ondansetron slowly through her IV.  We also drew 2 tubes of blood from her left cubital vein.  Impression and Recommendations:    Poor venous access Angiocatheter in the right cephalic vein at the level of the wrist, pushed ondansetron through IV, we also drew some blood from her left cubital vein.   I spent 25 minutes with this patient, greater than 50% was face-to-face time counseling regarding the above diagnoses ___________________________________________ Gwen Her. Dianah Field, M.D., ABFM., CAQSM. Primary Care and [place] [employer] of [place] [facility]

## 2017-09-30 NOTE — Patient Instructions (Signed)
Thank you for coming in today. Recheck tomorrow.  I will contact you  With results.  Do not take your blood pressure medicine, lisinopril, Maxzide or Metoprolol.    Dehydration, Adult Dehydration is a condition in which there is not enough fluid or water in the body. This happens when you lose more fluids than you take in. Important organs, such as the kidneys, brain, and heart, cannot function without a proper amount of fluids. Any loss of fluids from the body can lead to dehydration. Dehydration can range from mild to severe. This condition should be treated right away to prevent it from becoming severe. What are the causes? This condition may be caused by:  Vomiting.  Diarrhea.  Excessive sweating, such as from heat exposure or exercise.  Not drinking enough fluid, especially: ? When ill. ? While doing activity that requires a lot of energy.  Excessive urination.  Fever.  Infection.  Certain medicines, such as medicines that cause the body to lose excess fluid (diuretics).  Inability to access safe drinking water.  Reduced physical ability to get adequate water and food.  What increases the risk? This condition is more likely to develop in people:  Who have a poorly controlled long-term (chronic) illness, such as diabetes, heart disease, or kidney disease.  Who are age 98 or older.  Who are disabled.  Who live in a place with high altitude.  Who play endurance sports.  What are the signs or symptoms? Symptoms of mild dehydration may include:  Thirst.  Dry lips.  Slightly dry mouth.  Dry, warm skin.  Dizziness. Symptoms of moderate dehydration may include:  Very dry mouth.  Muscle cramps.  Dark urine. Urine may be the color of tea.  Decreased urine production.  Decreased tear production.  Heartbeat that is irregular or faster than normal (palpitations).  Headache.  Light-headedness, especially when you stand up from a sitting  position.  Fainting (syncope). Symptoms of severe dehydration may include:  Changes in skin, such as: ? Cold and clammy skin. ? Blotchy (mottled) or pale skin. ? Skin that does not quickly return to normal after being lightly pinched and released (poor skin turgor).  Changes in body fluids, such as: ? Extreme thirst. ? No tear production. ? Inability to sweat when body temperature is high, such as in hot weather. ? Very little urine production.  Changes in vital signs, such as: ? Weak pulse. ? Pulse that is more than 100 beats a minute when sitting still. ? Rapid breathing. ? Low blood pressure.  Other changes, such as: ? Sunken eyes. ? Cold hands and feet. ? Confusion. ? Lack of energy (lethargy). ? Difficulty waking up from sleep. ? Short-term weight loss. ? Unconsciousness. How is this diagnosed? This condition is diagnosed based on your symptoms and a physical exam. Blood and urine tests may be done to help confirm the diagnosis. How is this treated? Treatment for this condition depends on the severity. Mild or moderate dehydration can often be treated at home. Treatment should be started right away. Do not wait until dehydration becomes severe. Severe dehydration is an emergency and it needs to be treated in a hospital. Treatment for mild dehydration may include:  Drinking more fluids.  Replacing salts and minerals in your blood (electrolytes) that you may have lost. Treatment for moderate dehydration may include:  Drinking an oral rehydration solution (ORS). This is a drink that helps you replace fluids and electrolytes (rehydrate). It can be found at pharmacies  and retail stores. Treatment for severe dehydration may include:  Receiving fluids through an IV tube.  Receiving an electrolyte solution through a feeding tube that is passed through your nose and into your stomach (nasogastric tube, or NG tube).  Correcting any abnormalities in electrolytes.  Treating  the underlying cause of dehydration. Follow these instructions at home:  If directed by your health care provider, drink an ORS: ? Make an ORS by following instructions on the package. ? Start by drinking small amounts, about  cup (120 mL) every 5-10 minutes. ? Slowly increase how much you drink until you have taken the amount recommended by your health care provider.  Drink enough clear fluid to keep your urine clear or pale yellow. If you were told to drink an ORS, finish the ORS first, then start slowly drinking other clear fluids. Drink fluids such as: ? Water. Do not drink only water. Doing that can lead to having too little salt (sodium) in the body (hyponatremia). ? Ice chips. ? Fruit juice that you have added water to (diluted fruit juice). ? Low-calorie sports drinks.  Avoid: ? Alcohol. ? Drinks that contain a lot of sugar. These include high-calorie sports drinks, fruit juice that is not diluted, and soda. ? Caffeine. ? Foods that are greasy or contain a lot of fat or sugar.  Take over-the-counter and prescription medicines only as told by your health care provider.  Do not take sodium tablets. This can lead to having too much sodium in the body (hypernatremia).  Eat foods that contain a healthy balance of electrolytes, such as bananas, oranges, potatoes, tomatoes, and spinach.  Keep all follow-up visits as told by your health care provider. This is important. Contact a health care provider if:  You have abdominal pain that: ? Gets worse. ? Stays in one area (localizes).  You have a rash.  You have a stiff neck.  You are more irritable than usual.  You are sleepier or more difficult to wake up than usual.  You feel weak or dizzy.  You feel very thirsty.  You have urinated only a small amount of very dark urine over 6-8 hours. Get help right away if:  You have symptoms of severe dehydration.  You cannot drink fluids without vomiting.  Your symptoms get  worse with treatment.  You have a fever.  You have a severe headache.  You have vomiting or diarrhea that: ? Gets worse. ? Does not go away.  You have blood or green matter (bile) in your vomit.  You have blood in your stool. This may cause stool to look black and tarry.  You have not urinated in 6-8 hours.  You faint.  Your heart rate while sitting still is over 100 beats a minute.  You have trouble breathing. This information is not intended to replace advice given to you by your health care provider. Make sure you discuss any questions you have with your health care provider. Document Released: 07/08/2005 Document Revised: 02/02/2016 Document Reviewed: 09/01/2015 Elsevier Interactive Patient Education  [employer].

## 2017-09-30 NOTE — Assessment & Plan Note (Signed)
Angiocatheter in the right cephalic vein at the level of the wrist, pushed ondansetron through IV, we also drew some blood from her left cubital vein.

## 2017-10-01 ENCOUNTER — Encounter: Payer: Self-pay | Admitting: Family Medicine

## 2017-10-01 ENCOUNTER — Ambulatory Visit (INDEPENDENT_AMBULATORY_CARE_PROVIDER_SITE_OTHER): Payer: Medicare Other | Admitting: Family Medicine

## 2017-10-01 VITALS — BP 135/73 | HR 121 | Wt 171.0 lb

## 2017-10-01 DIAGNOSIS — E860 Dehydration: Secondary | ICD-10-CM | POA: Diagnosis not present

## 2017-10-01 DIAGNOSIS — A084 Viral intestinal infection, unspecified: Secondary | ICD-10-CM | POA: Diagnosis not present

## 2017-10-01 DIAGNOSIS — N179 Acute kidney failure, unspecified: Secondary | ICD-10-CM

## 2017-10-01 DIAGNOSIS — I480 Paroxysmal atrial fibrillation: Secondary | ICD-10-CM

## 2017-10-01 MED ORDER — ONDANSETRON HCL 4 MG PO TABS: 4.0000 mg | ORAL_TABLET | Freq: Three times a day (TID) | ORAL | 0 refills | Status: DC | PRN

## 2017-10-01 NOTE — Progress Notes (Signed)
Kimberly Jackson is a 72 y.o. female who presents to [facility]: [facility] today for follow up dehydration, NVD and AKI.   Kimberly Jackson was seen yesterday for vomiting and diarrhea with significant dehydration. She was treated with 1L iv NS and 4mg  IV zofran. She felt a lot better and was sent home with oral zofran. She had a CMP done at discharge which came back showing AKI with Creatine at 3.43 up from her baseline of <1.  She had stopped her BP medications (metoprolol, Lisinopril and Maxzide) yesterday and was asked to not take them today.  She notes a history of pA.Fib and notes palpitations today.   She however feels a lot better. She is able to keep food and drink down and is urinating. She denies any chest pain or light headedness.    Past Medical History:  Diagnosis Date  . Alcohol abuse   . COPD (chronic obstructive pulmonary disease) ([place])   . Depression   . Reflux    Past Surgical History:  Procedure Laterality Date  . ABDOMINAL HYSTERECTOMY    . APPENDECTOMY     72 yo  . BLADDER SURGERY  2015  . CHOLECYSTECTOMY    . REPLACEMENT TOTAL KNEE    . ROTATOR CUFF REPAIR    . TONSILLECTOMY     Social History   Tobacco Use  . Smoking status: Never Smoker  . Smokeless tobacco: Never Used  Substance Use Topics  . Alcohol use: No    Alcohol/week: 0.0 oz   family history includes Alcohol abuse in her mother.  ROS as above:  Medications: Current Outpatient Medications  Medication Sig Dispense Refill  . AMBULATORY NON FORMULARY MEDICATION Blood pressure monitor use daily for hypertension. 1 Device 0  . Cholecalciferol (VITAMIN D3) 5000 units CAPS Take 5,000 Units by mouth daily.    . diclofenac sodium (VOLTAREN) 1 % GEL Apply 2 g 4 (four) times daily topically. To affected joint. 100 g 11  . digoxin (DIGOX) 0.125 MG tablet Take 1 tablet (125 mcg total) daily by mouth. 30  tablet 2  . fluticasone (FLONASE) 50 MCG/ACT nasal spray SPRAY 1 SPRAY IN EACH NOSTRILS BID  0  . fluticasone-salmeterol (ADVAIR HFA) 115-21 MCG/ACT inhaler Inhale 2 puffs into the lungs 2 (two) times daily. 1 Inhaler 6  . ipratropium (ATROVENT) 0.06 % nasal spray Place 2 sprays into both nostrils every 4 (four) hours as needed for rhinitis. 10 mL 6  . Ipratropium-Albuterol (COMBIVENT) 20-100 MCG/ACT AERS respimat Inhale 1 puff into the lungs every 6 (six) hours as needed for wheezing or shortness of breath. 1 Inhaler 5  . ipratropium-albuterol (DUONEB) 0.5-2.5 (3) MG/3ML SOLN Take 3 mLs by nebulization every 6 (six) hours as needed. 360 mL 1  . lisinopril (PRINIVIL,ZESTRIL) 10 MG tablet Take 1 tablet (10 mg total) daily by mouth. 30 tablet 3  . metoprolol succinate (TOPROL-XL) 100 MG 24 hr tablet TAKE 1 TABLET BY MOUTH DAILY. TAKE WITH OR IMMEDIATELY FOLLOWING A MEAL. 90 tablet 1  . omeprazole (PRILOSEC) 40 MG capsule Take 1 capsule (40 mg total) by mouth daily. 90 capsule 3  . triamterene-hydrochlorothiazide (MAXZIDE-25) 37.5-25 MG tablet Take 1 tablet daily by mouth. 30 tablet 3  . ondansetron (ZOFRAN) 4 MG tablet Take 1 tablet (4 mg total) by mouth every 8 (eight) hours as needed for nausea or vomiting. 20 tablet 0   No current facility-administered medications for this visit.  Allergies  Allergen Reactions  . Cobalt-Mn [Manganese]   . Compazine [Prochlorperazine]   . Corticosteroids   . Eggs Or Egg-Derived Products   . Penicillins     Health Maintenance Health Maintenance  Topic Date Due  . MAMMOGRAM  03/07/2019  . COLONOSCOPY  09/16/2022  . TETANUS/TDAP  11/19/2024  . INFLUENZA VACCINE  Completed  . DEXA SCAN  Completed  . Hepatitis C Screening  Completed  . PNA vac Low Risk Adult  Completed     Exam:  BP 135/73   Pulse (!) 121   Wt 171 lb (77.6 kg)   BMI 29.35 kg/m   Orthostatic VS for the past 24 hrs:  BP- Lying Pulse- Lying BP- Sitting Pulse- Sitting  10/01/17  1047 128/80 111 115/78 128     Gen: Well NAD non-toxic appearing.  HEENT: EOMI,  MMM Lungs: Normal work of breathing. CTABL Heart: Mild tachy around 110 bpm with irrag irreg no MRG Abd: NABS, Soft. Nondistended, Nontender Exts: Brisk capillary refill, warm and well perfused. Normal skin turgor.    Results for orders placed or performed in visit on 09/30/17 (from the past 72 hour(s))  CBC     Status: Abnormal   Collection Time: 09/30/17 12:00 AM  Result Value Ref Range   WBC 10.9 (H) 3.8 - 10.8 Thousand/uL   RBC 5.23 (H) 3.80 - 5.10 Million/uL   Hemoglobin 15.7 (H) 11.7 - 15.5 g/dL   HCT 45.4 (H) 35.0 - 45.0 %   MCV 86.8 80.0 - 100.0 fL   MCH 30.0 27.0 - 33.0 pg   MCHC 34.6 32.0 - 36.0 g/dL   RDW 13.1 11.0 - 15.0 %   Platelets 403 (H) 140 - 400 Thousand/uL   MPV 10.1 7.5 - 12.5 fL  COMPLETE METABOLIC PANEL WITH GFR     Status: Abnormal   Collection Time: 09/30/17 12:00 AM  Result Value Ref Range   Glucose, Bld 101 (H) 65 - 99 mg/dL    Comment: .            Fasting reference interval . For someone without known diabetes, a glucose value between 100 and 125 mg/dL is consistent with prediabetes and should be confirmed with a follow-up test. .    BUN 47 (H) 7 - 25 mg/dL   Creat 3.43 (H) 0.60 - 0.93 mg/dL    Comment: For patients >74 years of age, the reference limit for Creatinine is approximately 13% higher for people identified as African-American. .    GFR, Est Non African American 13 (L) > OR = 60 mL/min/1.26m2   GFR, Est African American 15 (L) > OR = 60 mL/min/1.69m2   BUN/Creatinine Ratio 14 6 - 22 (calc)   Sodium 134 (L) 135 - 146 mmol/L   Potassium 4.2 3.5 - 5.3 mmol/L   Chloride 100 98 - 110 mmol/L   CO2 19 (L) 20 - 32 mmol/L   Calcium 9.1 8.6 - 10.4 mg/dL   Total Protein 7.9 6.1 - 8.1 g/dL   Albumin 4.4 3.6 - 5.1 g/dL   Globulin 3.5 1.9 - 3.7 g/dL (calc)   AG Ratio 1.3 1.0 - 2.5 (calc)   Total Bilirubin 0.7 0.2 - 1.2 mg/dL   Alkaline phosphatase  (APISO) 117 33 - 130 U/L   AST 22 10 - 35 U/L   ALT 18 6 - 29 U/L  Lipase     Status: None   Collection Time: 09/30/17 12:00 AM  Result Value Ref Range  Lipase 31 7 - 60 U/L   No results found.    Assessment and Plan: 72 y.o. female with  Dehydration: Resolved or nearly resolved. Continue oral hydration. Follow up in a few days.   NVD: Resolved or nearly resolved. Use Zofran PRN and advance diet as tolerated.   AKI: Likely due to dehydration and Lisinopril and Maxzide. Plan to recheck creatine today for trend. If worsening may try to admit. Will trend over the next few days.  Continue to hold Lisinopril and Maxzide for now.   Tachycardia. Very likely pAfib due to stopping metoprolol. Plan to restart and recheck. If not better return tomorrow.   Overall Kimberly Jackson is much better today than yesterday.    Orders Placed This Encounter  Procedures  . COMPLETE METABOLIC PANEL WITH GFR   Meds ordered this encounter  Medications  . ondansetron (ZOFRAN) 4 MG tablet    Sig: Take 1 tablet (4 mg total) by mouth every 8 (eight) hours as needed for nausea or vomiting.    Dispense:  20 tablet    Refill:  0     Discussed warning signs or symptoms. Please see discharge instructions. Patient expresses understanding.

## 2017-10-01 NOTE — Patient Instructions (Signed)
Thank you for coming in today. Restart Metoprolol.  Continue to hold Maxzide and Lisinopril.  Recheck Friday.  Drink plenty of fluids.    Acute Kidney Injury, Adult Acute kidney injury is a sudden worsening of kidney function. The kidneys are organs that have several jobs. They filter the blood to remove waste products and extra fluid. They also maintain a healthy balance of minerals and hormones in the body, which helps control blood pressure and keep bones strong. With this condition, your kidneys do not do their jobs as well as they should. This condition ranges from mild to severe. Over time it may develop into long-lasting (chronic) kidney disease. Early detection and treatment may prevent acute kidney injury from developing into a chronic condition. What are the causes? Common causes of this condition include:  A problem with blood flow to the kidneys. This may be caused by: ? Low blood pressure (hypotension) or shock. ? Blood loss. ? Heart and blood vessel (cardiovascular) disease. ? Severe burns. ? Liver disease.  Direct damage to the kidneys. This may be caused by: ? Certain medicines. ? A kidney infection. ? Poisoning. ? Being around or in contact with toxic substances. ? A surgical wound. ? A hard, direct hit to the kidney area.  A sudden blockage of urine flow. This may be caused by: ? Cancer. ? Kidney stones. ? An enlarged prostate in males.  What are the signs or symptoms? Symptoms of this condition may not be obvious until the condition becomes severe. Symptoms of this condition can include:  Tiredness (lethargy), or difficulty staying awake.  Nausea or vomiting.  Swelling (edema) of the face, legs, ankles, or feet.  Problems with urination, such as: ? Abdominal pain, or pain along the side of your stomach (flank). ? Decreased urine production. ? Decrease in the force of urine flow.  Muscle twitches and cramps, especially in the legs.  Confusion or  trouble concentrating.  Loss of appetite.  Fever.  How is this diagnosed? This condition may be diagnosed with tests, including:  Blood tests.  Urine tests.  Imaging tests.  A test in which a sample of tissue is removed from the kidneys to be examined under a microscope (kidney biopsy).  How is this treated? Treatment for this condition depends on the cause and how severe the condition is. In mild cases, treatment may not be needed. The kidneys may heal on their own. In more severe cases, treatment will involve:  Treating the cause of the kidney injury. This may involve changing any medicines you are taking or adjusting your dosage.  Fluids. You may need specialized IV fluids to balance your body's needs.  Having a catheter placed to drain urine and prevent blockages.  Preventing problems from occurring. This may mean avoiding certain medicines or procedures that can cause further injury to the kidneys.  In some cases treatment may also require:  A procedure to remove toxic wastes from the body (dialysis or continuous renal replacement therapy - CRRT).  Surgery. This may be done to repair a torn kidney, or to remove the blockage from the urinary system.  Follow these instructions at home: Medicines  Take over-the-counter and prescription medicines only as told by your health care provider.  Do not take any new medicines without your health care provider's approval. Many medicines can worsen your kidney damage.  Do not take any vitamin and mineral supplements without your health care provider's approval. Many nutritional supplements can worsen your kidney damage. Lifestyle  If your health care provider prescribed changes to your diet, follow them. You may need to decrease the amount of protein you eat.  Achieve and maintain a healthy weight. If you need help with this, ask your health care provider.  Start or continue an exercise plan. Try to exercise at least 30  minutes a day, 5 days a week.  Do not use any tobacco products, such as cigarettes, chewing tobacco, and e-cigarettes. If you need help quitting, ask your health care provider. General instructions  Keep track of your blood pressure. Report changes in your blood pressure as told by your health care provider.  Stay up to date with immunizations. Ask your health care provider which immunizations you need.  Keep all follow-up visits as told by your health care provider. This is important. Where to find more information:  American Association of Kidney Patients: BombTimer.gl  National Kidney Foundation: www.kidney.[place]: https://mathis.com/  Life [facility]: ? www.lifeoptions.org ? www.kidneyschool.org Contact a health care provider if:  Your symptoms get worse.  You develop new symptoms. Get help right away if:  You develop symptoms of worsening kidney disease, which include: ? Headaches. ? Abnormally dark or light skin. ? Easy bruising. ? Frequent hiccups. ? Chest pain. ? Shortness of breath. ? End of menstruation in women. ? Seizures. ? Confusion or altered mental status. ? Abdominal or back pain. ? Itchiness.  You have a fever.  Your body is producing less urine.  You have pain or bleeding when you urinate. Summary  Acute kidney injury is a sudden worsening of kidney function.  Acute kidney injury can be caused by problems with blood flow to the kidneys, direct damage to the kidneys, and sudden blockage of urine flow.  Symptoms of this condition may not be obvious until it becomes severe. Symptoms may include edema, lethargy, confusion, nausea or vomiting, and problems passing urine.  This condition can usually be diagnosed with blood tests, urine tests, and imaging tests. Sometimes a kidney biopsy is done to diagnose this condition.  Treatment for this condition often involves treating the underlying cause. It is treated with  fluids, medicines, dialysis, diet changes, or surgery. This information is not intended to replace advice given to you by your health care provider. Make sure you discuss any questions you have with your health care provider. Document Released: 01/21/2011 Document Revised: 11/07/2016 Document Reviewed: 06/28/2016 Elsevier Interactive Patient Education  [employer].

## 2017-10-03 ENCOUNTER — Telehealth: Payer: Self-pay

## 2017-10-03 NOTE — Telephone Encounter (Signed)
Yes please continue digoxin

## 2017-10-03 NOTE — Telephone Encounter (Signed)
Patient called stated that she has stopped taking her blood pressure medication, she wants to know if she can still take her Digoxin. Please advise. Rhonda Cunningham,CMA

## 2017-10-06 ENCOUNTER — Other Ambulatory Visit: Payer: Self-pay | Admitting: Family Medicine

## 2017-10-06 ENCOUNTER — Encounter: Payer: Self-pay | Admitting: Family Medicine

## 2017-10-06 ENCOUNTER — Telehealth: Payer: Self-pay | Admitting: Family Medicine

## 2017-10-06 ENCOUNTER — Ambulatory Visit (INDEPENDENT_AMBULATORY_CARE_PROVIDER_SITE_OTHER): Payer: Medicare Other | Admitting: Family Medicine

## 2017-10-06 VITALS — BP 146/76 | HR 68 | Wt 184.0 lb

## 2017-10-06 DIAGNOSIS — E612 Magnesium deficiency: Secondary | ICD-10-CM

## 2017-10-06 DIAGNOSIS — N179 Acute kidney failure, unspecified: Secondary | ICD-10-CM

## 2017-10-06 DIAGNOSIS — I480 Paroxysmal atrial fibrillation: Secondary | ICD-10-CM

## 2017-10-06 DIAGNOSIS — R790 Abnormal level of blood mineral: Secondary | ICD-10-CM

## 2017-10-06 DIAGNOSIS — R251 Tremor, unspecified: Secondary | ICD-10-CM

## 2017-10-06 NOTE — Progress Notes (Signed)
Kimberly Jackson is a 72 y.o. female who presents to [place]: [place] today for follow-up acute kidney injury dehydration and atrial fibrillation.  Kimberly Jackson was seen last week where she had an episode of gastroenteritis with resulting acute kidney injury and dehydration.  She was seen the following day and had improved considerably but continued to have reduced kidney function improved from 3.4 down to 1.7 closer to her baseline of 0.8.  However the last visit she had stopped her metoprolol and was in atrial fibrillation with a heart rate of about 120.  In the interim she has resumed all of her medications except her lisinopril and Maxide.  Continues metoprolol and digoxin.  She notes that she is feeling a lot better but does feel jittery at that.  She notes that she is regained she is eating and drinking normally.   Past Medical History:  Diagnosis Date  . Alcohol abuse   . COPD (chronic obstructive pulmonary disease) ([place])   . Depression   . Reflux    Past Surgical History:  Procedure Laterality Date  . ABDOMINAL HYSTERECTOMY    . APPENDECTOMY     72 yo  . BLADDER SURGERY  2015  . CHOLECYSTECTOMY    . REPLACEMENT TOTAL KNEE    . ROTATOR CUFF REPAIR    . TONSILLECTOMY     Social History   Tobacco Use  . Smoking status: Never Smoker  . Smokeless tobacco: Never Used  Substance Use Topics  . Alcohol use: No    Alcohol/week: 0.0 oz   family history includes Alcohol abuse in her mother.  ROS as above:  Medications: Current Outpatient Medications  Medication Sig Dispense Refill  . AMBULATORY NON FORMULARY MEDICATION Blood pressure monitor use daily for hypertension. 1 Device 0  . Cholecalciferol (VITAMIN D3) 5000 units CAPS Take 5,000 Units by mouth daily.    . diclofenac sodium (VOLTAREN) 1 % GEL Apply 2 g 4 (four) times daily topically. To affected joint. 100 g 11  .  digoxin (DIGOX) 0.125 MG tablet Take 1 tablet (125 mcg total) daily by mouth. 30 tablet 2  . fluticasone (FLONASE) 50 MCG/ACT nasal spray SPRAY 1 SPRAY IN EACH NOSTRILS BID  0  . fluticasone-salmeterol (ADVAIR HFA) 115-21 MCG/ACT inhaler Inhale 2 puffs into the lungs 2 (two) times daily. 1 Inhaler 6  . ipratropium (ATROVENT) 0.06 % nasal spray Place 2 sprays into both nostrils every 4 (four) hours as needed for rhinitis. 10 mL 6  . Ipratropium-Albuterol (COMBIVENT) 20-100 MCG/ACT AERS respimat Inhale 1 puff into the lungs every 6 (six) hours as needed for wheezing or shortness of breath. 1 Inhaler 5  . ipratropium-albuterol (DUONEB) 0.5-2.5 (3) MG/3ML SOLN Take 3 mLs by nebulization every 6 (six) hours as needed. 360 mL 1  . lisinopril (PRINIVIL,ZESTRIL) 10 MG tablet Take 1 tablet (10 mg total) daily by mouth. 30 tablet 3  . metoprolol succinate (TOPROL-XL) 100 MG 24 hr tablet TAKE 1 TABLET BY MOUTH DAILY. TAKE WITH OR IMMEDIATELY FOLLOWING A MEAL. 90 tablet 1  . omeprazole (PRILOSEC) 40 MG capsule Take 1 capsule (40 mg total) by mouth daily. 90 capsule 3  . ondansetron (ZOFRAN) 4 MG tablet Take 1 tablet (4 mg total) by mouth every 8 (eight) hours as needed for nausea or vomiting. 20 tablet 0  . triamterene-hydrochlorothiazide (MAXZIDE-25) 37.5-25 MG tablet Take 1 tablet daily by mouth. 30 tablet 3   No current facility-administered  medications for this visit.    Allergies  Allergen Reactions  . Cobalt-Mn [Manganese]   . Compazine [Prochlorperazine]   . Corticosteroids   . Eggs Or Egg-Derived Products   . Penicillins     Health Maintenance Health Maintenance  Topic Date Due  . MAMMOGRAM  03/07/2019  . COLONOSCOPY  09/16/2022  . TETANUS/TDAP  11/19/2024  . INFLUENZA VACCINE  Completed  . DEXA SCAN  Completed  . Hepatitis C Screening  Completed  . PNA vac Low Risk Adult  Completed     Exam:  BP (!) 146/76   Pulse 68   Wt 184 lb (83.5 kg)   BMI 31.58 kg/m  Gen: Well NAD HEENT:  EOMI,  MMM Lungs: Normal work of breathing. CTABL Heart: RRR no MRG Abd: NABS, Soft. Nondistended, Nontender Exts: Brisk capillary refill, warm and well perfused.  Neuro: Slight tremor normal speech balance and gait.   No results found for this or any previous visit (from the past 72 hour(s)). No results found.    Assessment and Plan: 72 y.o. female with  Acute kidney injury: Plan to recheck metabolic panel is normalized to restart Maxide.  Plan to recheck in 1 week and will likely restart lisinopril at that time.  Atrial fibrillation: In normal rhythm today.  Continue current regimen.  Shakiness: Unclear etiology.  Likely simple recovery from illness however digoxin could be a factor here.  If she does not improve plan to check a digoxin level in the near future.  Will check magnesium as well as part of metabolic panel.   Orders Placed This Encounter  Procedures  . COMPLETE METABOLIC PANEL WITH GFR  . Magnesium   No orders of the defined types were placed in this encounter.    Discussed warning signs or symptoms. Please see discharge instructions. Patient expresses understanding.

## 2017-10-06 NOTE — Telephone Encounter (Signed)
Calcium evel decreased on recent labs.  See if we can add these labs on to the ones obtained on 10/06/17.If not we will draw them at the next visit.

## 2017-10-06 NOTE — Telephone Encounter (Signed)
Patient has been informed to continue taking Digoxin. Rhonda Cunningham,CMA

## 2017-10-06 NOTE — Patient Instructions (Signed)
Thank you for coming in today. Get labs today.  We will likely restart the Maxzide fluid pill first.  I anticipate a recheck in 1 week but we may do it sooner if we get surprised.

## 2017-10-07 LAB — COMPLETE METABOLIC PANEL WITH GFR
AG Ratio: 1.3 (calc) (ref 1.0–2.5)
AG Ratio: 1.5 (calc) (ref 1.0–2.5)
ALKALINE PHOSPHATASE (APISO): 90 U/L (ref 33–130)
ALT: 17 U/L (ref 6–29)
ALT: 15 U/L (ref 6–29)
AST: 19 U/L (ref 10–35)
AST: 17 U/L (ref 10–35)
Albumin: 4.3 g/dL (ref 3.6–5.1)
Albumin: 3.6 g/dL (ref 3.6–5.1)
Alkaline phosphatase (APISO): 117 U/L (ref 33–130)
BILIRUBIN TOTAL: 0.4 mg/dL (ref 0.2–1.2)
BUN/Creatinine Ratio: 25 (calc) — ABNORMAL HIGH (ref 6–22)
BUN: 43 mg/dL — AB (ref 7–25)
BUN: 10 mg/dL (ref 7–25)
CALCIUM: 9.2 mg/dL (ref 8.6–10.4)
CHLORIDE: 100 mmol/L (ref 98–110)
CHLORIDE: 109 mmol/L (ref 98–110)
CO2: 21 mmol/L (ref 20–32)
CO2: 26 mmol/L (ref 20–32)
Calcium: 8.2 mg/dL — ABNORMAL LOW (ref 8.6–10.4)
Creat: 1.7 mg/dL — ABNORMAL HIGH (ref 0.60–0.93)
Creat: 0.7 mg/dL (ref 0.60–0.93)
GFR, EST AFRICAN AMERICAN: 34 mL/min/{1.73_m2} — AB (ref >=60)
GFR, EST NON AFRICAN AMERICAN: 29 mL/min/{1.73_m2} — AB (ref >=60)
GFR, Est African American: 100 mL/min/{1.73_m2} (ref >=60)
GFR, Est Non African American: 86 mL/min/{1.73_m2} (ref >=60)
GLUCOSE: 109 mg/dL (ref 65–139)
GLUCOSE: 96 mg/dL (ref 65–99)
Globulin: 3.2 g/dL (calc) (ref 1.9–3.7)
Globulin: 2.4 g/dL (calc) (ref 1.9–3.7)
POTASSIUM: 3.3 mmol/L — AB (ref 3.5–5.3)
POTASSIUM: 3.9 mmol/L (ref 3.5–5.3)
Sodium: 133 mmol/L — ABNORMAL LOW (ref 135–146)
Sodium: 142 mmol/L (ref 135–146)
TOTAL PROTEIN: 7.5 g/dL (ref 6.1–8.1)
TOTAL PROTEIN: 6 g/dL — AB (ref 6.1–8.1)
Total Bilirubin: 0.9 mg/dL (ref 0.2–1.2)

## 2017-10-07 LAB — PTH, INTACT AND CALCIUM

## 2017-10-07 LAB — MAGNESIUM: MAGNESIUM: 1.2 mg/dL — AB (ref 1.5–2.5)

## 2017-10-07 NOTE — Telephone Encounter (Signed)
Patient advised. Kimberly Jackson,CMA  

## 2017-10-08 ENCOUNTER — Other Ambulatory Visit: Payer: Self-pay | Admitting: Family Medicine

## 2017-10-13 ENCOUNTER — Encounter: Payer: Self-pay | Admitting: Family Medicine

## 2017-10-13 ENCOUNTER — Ambulatory Visit (INDEPENDENT_AMBULATORY_CARE_PROVIDER_SITE_OTHER): Payer: Medicare Other | Admitting: Family Medicine

## 2017-10-13 VITALS — BP 161/75 | HR 64 | Ht 64.0 in | Wt 177.0 lb

## 2017-10-13 DIAGNOSIS — Z5181 Encounter for therapeutic drug level monitoring: Secondary | ICD-10-CM | POA: Diagnosis not present

## 2017-10-13 DIAGNOSIS — I10 Essential (primary) hypertension: Secondary | ICD-10-CM

## 2017-10-13 DIAGNOSIS — E612 Magnesium deficiency: Secondary | ICD-10-CM

## 2017-10-13 NOTE — Patient Instructions (Addendum)
Thank you for coming in today. Get labs today.  I will likely restart blood pressure medicine tomorrow and recheck in 1 month.  Return sooner if needed.

## 2017-10-13 NOTE — Progress Notes (Signed)
Kimberly Jackson is a 72 y.o. female who presents to [place]: [place] today for follow up acute kidney injury dehydration, edema, and atrial fibrillation. At last visit, the patient's creatinine was close to her baseline, and her heart was regular rate and rhythm. Patient's has been taking Maxide, Digoxin, and Metroprolol with no new reported symptoms. She states her edema has improved and she is feeling normal. She denies palpitations and shortness of breath. Patient's calcium and magnesium were low last visit, but she reports no signs of hypocalcemia or magnesium. Patient states she is taking over the counter magnesium supplements.     Past Medical History:  Diagnosis Date  . Alcohol abuse   . COPD (chronic obstructive pulmonary disease) ([place])   . Depression   . Reflux    Past Surgical History:  Procedure Laterality Date  . ABDOMINAL HYSTERECTOMY    . APPENDECTOMY     72 yo  . BLADDER SURGERY  2015  . CHOLECYSTECTOMY    . REPLACEMENT TOTAL KNEE    . ROTATOR CUFF REPAIR    . TONSILLECTOMY     Social History   Tobacco Use  . Smoking status: Never Smoker  . Smokeless tobacco: Never Used  Substance Use Topics  . Alcohol use: No    Alcohol/week: 0.0 oz   family history includes Alcohol abuse in her mother.  ROS as above:  Medications: Current Outpatient Medications  Medication Sig Dispense Refill  . AMBULATORY NON FORMULARY MEDICATION Blood pressure monitor use daily for hypertension. 1 Device 0  . Cholecalciferol (VITAMIN D3) 5000 units CAPS Take 5,000 Units by mouth daily.    . diclofenac sodium (VOLTAREN) 1 % GEL Apply 2 g 4 (four) times daily topically. To affected joint. 100 g 11  . DIGOX 125 MCG tablet TAKE 1 TABLET BY MOUTH EVERY DAY 90 tablet 1  . fluticasone (FLONASE) 50 MCG/ACT nasal spray SPRAY 1 SPRAY IN EACH NOSTRILS BID  0  .  fluticasone-salmeterol (ADVAIR HFA) 115-21 MCG/ACT inhaler Inhale 2 puffs into the lungs 2 (two) times daily. 1 Inhaler 6  . ipratropium (ATROVENT) 0.06 % nasal spray Place 2 sprays into both nostrils every 4 (four) hours as needed for rhinitis. 10 mL 6  . Ipratropium-Albuterol (COMBIVENT) 20-100 MCG/ACT AERS respimat Inhale 1 puff into the lungs every 6 (six) hours as needed for wheezing or shortness of breath. 1 Inhaler 5  . ipratropium-albuterol (DUONEB) 0.5-2.5 (3) MG/3ML SOLN Take 3 mLs by nebulization every 6 (six) hours as needed. 360 mL 1  . lisinopril (PRINIVIL,ZESTRIL) 10 MG tablet Take 1 tablet (10 mg total) daily by mouth. 30 tablet 3  . metoprolol succinate (TOPROL-XL) 100 MG 24 hr tablet TAKE 1 TABLET BY MOUTH DAILY. TAKE WITH OR IMMEDIATELY FOLLOWING A MEAL. 90 tablet 1  . omeprazole (PRILOSEC) 40 MG capsule Take 1 capsule (40 mg total) by mouth daily. 90 capsule 3  . ondansetron (ZOFRAN) 4 MG tablet Take 1 tablet (4 mg total) by mouth every 8 (eight) hours as needed for nausea or vomiting. 20 tablet 0  . triamterene-hydrochlorothiazide (MAXZIDE-25) 37.5-25 MG tablet Take 1 tablet daily by mouth. 30 tablet 3   No current facility-administered medications for this visit.    Allergies  Allergen Reactions  . Cobalt-Mn [Manganese]   . Compazine [Prochlorperazine]   . Corticosteroids   . Eggs Or Egg-Derived Products   . Penicillins     Health Maintenance Health Maintenance  Topic Date Due  . MAMMOGRAM  03/07/2019  . COLONOSCOPY  09/16/2022  . TETANUS/TDAP  11/19/2024  . INFLUENZA VACCINE  Completed  . DEXA SCAN  Completed  . Hepatitis C Screening  Completed  . PNA vac Low Risk Adult  Completed     Exam:  BP (!) 161/75   Pulse 64   Ht 5\' 4"  (1.626 m)   Wt 177 lb (80.3 kg)   BMI 30.38 kg/m  Gen: Well NAD HEENT: EOMI,  MMM Lungs: Normal work of breathing. CTABL Heart: RRR no MRG Abd: NABS, Soft. Nondistended, Nontender Exts: Brisk capillary refill, warm and well  perfused.    No results found for this or any previous visit (from the past 72 hour(s)). No results found.    Assessment and Plan: 72 y.o. female with  Acute Kidney injury: Patient is asymptomatic and her baseline creatinine has remained normal. Maxide has reduced the edema effectively. Con  Hypertension: The patient's kidney function has returned to baseline. She may restart her blood pressure medications.   Digoxin: Measuring Digoxin level today after not taking her medications this morning. If normal, continue current regimen.   Hypocalcemia: Rechecking calcium level and PTH levels. Calcium levels will be helped with the restarting of her thiazide diuretic.  Orders Placed This Encounter  Procedures  . PTH, Intact and Calcium  . Magnesium  . Renal Function Panel  . Digoxin level   No orders of the defined types were placed in this encounter.    Discussed warning signs or symptoms. Please see discharge instructions. Patient expresses understanding.

## 2017-10-14 LAB — RENAL FUNCTION PANEL
Albumin: 4.2 g/dL (ref 3.6–5.1)
BUN: 14 mg/dL (ref 7–25)
CO2: 29 mmol/L (ref 20–32)
CREATININE: 0.85 mg/dL (ref 0.60–0.93)
Calcium: 9.4 mg/dL (ref 8.6–10.4)
Chloride: 103 mmol/L (ref 98–110)
Glucose, Bld: 106 mg/dL — ABNORMAL HIGH (ref 65–99)
POTASSIUM: 4.3 mmol/L (ref 3.5–5.3)
Phosphorus: 2.9 mg/dL (ref 2.1–4.3)
SODIUM: 138 mmol/L (ref 135–146)

## 2017-10-14 LAB — MAGNESIUM: Magnesium: 1.7 mg/dL (ref 1.5–2.5)

## 2017-10-14 LAB — PTH, INTACT AND CALCIUM
Calcium: 9.4 mg/dL (ref 8.6–10.4)
PTH: 87 pg/mL — ABNORMAL HIGH (ref 14–64)

## 2017-10-14 LAB — DIGOXIN LEVEL: Digoxin Level: <0.5 mcg/L — ABNORMAL LOW (ref 0.8–2.0)

## 2017-10-24 ENCOUNTER — Emergency Department (INDEPENDENT_AMBULATORY_CARE_PROVIDER_SITE_OTHER)
Admission: EM | Admit: 2017-10-24 | Discharge: 2017-10-24 | Disposition: A | Discharge status: Home / Self Care | Payer: Medicare Other | Source: Home / Self Care | Attending: Emergency Medicine | Admitting: Emergency Medicine

## 2017-10-24 ENCOUNTER — Other Ambulatory Visit: Payer: Self-pay

## 2017-10-24 ENCOUNTER — Encounter: Payer: Self-pay | Admitting: Emergency Medicine

## 2017-10-24 ENCOUNTER — Other Ambulatory Visit: Payer: Self-pay | Admitting: Family Medicine

## 2017-10-24 ENCOUNTER — Telehealth: Payer: Self-pay | Admitting: Family Medicine

## 2017-10-24 DIAGNOSIS — R110 Nausea: Secondary | ICD-10-CM | POA: Diagnosis not present

## 2017-10-24 DIAGNOSIS — J441 Chronic obstructive pulmonary disease with (acute) exacerbation: Secondary | ICD-10-CM

## 2017-10-24 MED ORDER — IPRATROPIUM-ALBUTEROL 0.5-2.5 (3) MG/3ML IN SOLN: 3.0000 mL | Freq: Four times a day (QID) | RESPIRATORY_TRACT | 1 refills | Status: DC | PRN

## 2017-10-24 MED ORDER — IPRATROPIUM-ALBUTEROL 0.5-2.5 (3) MG/3ML IN SOLN
3.0000 mL | Freq: Once | RESPIRATORY_TRACT | Status: AC
  Administered 2017-10-24: 3 mL via RESPIRATORY_TRACT

## 2017-10-24 MED ORDER — IPRATROPIUM-ALBUTEROL 0.5-2.5 (3) MG/3ML IN SOLN: RESPIRATORY_TRACT | 1 refills | Status: DC

## 2017-10-24 NOTE — ED Provider Notes (Signed)
[name]    CSN: [id number] Arrival date & time: 10/24/17  7253     History   Chief Complaint Chief Complaint  Patient presents with  . Nasal Congestion  . Cough    HPI Kimberly Jackson is a 72 y.o. female.  Patient has a history of COPD.  She is not a smoker.  She started feeling bad the first of the week with head congestion scratchy throat and cough which she thought was allergy related.  She ran out of her DuoNeb's which she keeps for COPD.  She enters today with chest tightness.  She recently recovered from a kidney  injury associated with atrial fib triggered by dehydration. HPI  Past Medical History:  Diagnosis Date  . Alcohol abuse   . COPD (chronic obstructive pulmonary disease) ([place])   . Depression   . Reflux     Patient Active Problem List   Diagnosis Date Noted  . Magnesium deficiency 10/06/2017  . Low calcium levels 10/06/2017  . Poor venous access 09/30/2017  . Right knee DJD 09/10/2017  . Left shoulder pain 12/09/2016  . OSA and COPD overlap syndrome ([place]) 07/05/2016  . Right foot pain 12/07/2015  . Vitamin D deficiency 09/11/2015  . Prediabetes 09/08/2015  . Osteopenia 09/08/2015  . Ventral hernia 09/08/2015  . COPD (chronic obstructive pulmonary disease) ([place]) 09/04/2015  . Paroxysmal A-fib ([place]) 09/04/2015  . HTN (hypertension) 09/04/2015  . Long-term use of high-risk medication 09/04/2015    Past Surgical History:  Procedure Laterality Date  . ABDOMINAL HYSTERECTOMY    . APPENDECTOMY     72 yo  . BLADDER SURGERY  2015  . CHOLECYSTECTOMY    . REPLACEMENT TOTAL KNEE    . ROTATOR CUFF REPAIR    . TONSILLECTOMY      OB History   None      Home Medications    Prior to Admission medications   Medication Sig Start Date End Date Taking? Authorizing Provider  AMBULATORY NON FORMULARY MEDICATION Blood pressure monitor use daily for hypertension. 02/19/16   Gregor Hams, MD  Cholecalciferol (VITAMIN D3) 5000 units CAPS Take  5,000 Units by mouth daily.    [provider]  diclofenac sodium (VOLTAREN) 1 % GEL Apply 2 g 4 (four) times daily topically. To affected joint. 06/09/17   Gregor Hams, MD  DIGOX 125 MCG tablet TAKE 1 TABLET BY MOUTH EVERY DAY 10/08/17   Gregor Hams, MD  fluticasone [facility]) 50 MCG/ACT nasal spray SPRAY 1 SPRAY IN EACH NOSTRILS BID 09/01/15   [provider]  fluticasone-salmeterol (ADVAIR HFA) 115-21 MCG/ACT inhaler Inhale 2 puffs into the lungs 2 (two) times daily. 09/04/15   Gregor Hams, MD  ipratropium (ATROVENT) 0.06 % nasal spray Place 2 sprays into both nostrils every 4 (four) hours as needed for rhinitis. 09/10/17   Gregor Hams, MD  Ipratropium-Albuterol (COMBIVENT) 20-100 MCG/ACT AERS respimat Inhale 1 puff into the lungs every 6 (six) hours as needed for wheezing or shortness of breath. 09/04/15   Gregor Hams, MD  ipratropium-albuterol (DUONEB) 0.5-2.5 (3) MG/3ML SOLN Use 1 every 6 hours as needed for wheezing 10/24/17   Darlyne Russian, MD  lisinopril (PRINIVIL,ZESTRIL) 10 MG tablet Take 1 tablet (10 mg total) daily by mouth. 06/09/17   Gregor Hams, MD  metoprolol succinate (TOPROL-XL) 100 MG 24 hr tablet TAKE 1 TABLET BY MOUTH DAILY. TAKE WITH OR IMMEDIATELY FOLLOWING A MEAL. 08/06/17   Lynne Leader  S, MD  omeprazole (PRILOSEC) 40 MG capsule Take 1 capsule (40 mg total) by mouth daily. 02/27/17   Gregor Hams, MD  ondansetron (ZOFRAN) 4 MG tablet Take 1 tablet (4 mg total) by mouth every 8 (eight) hours as needed for nausea or vomiting. 10/01/17   Gregor Hams, MD  triamterene-hydrochlorothiazide [facility]) 37.5-25 MG tablet Take 1 tablet daily by mouth. 06/09/17   Gregor Hams, MD    Family History Family History  Problem Relation Age of Onset  . Alcohol abuse Mother     Social History Social History   Tobacco Use  . Smoking status: Never Smoker  . Smokeless tobacco: Never Used  Substance Use Topics  . Alcohol use: No    Alcohol/week: 0.0 oz  . Drug  use: No     Allergies   Cobalt-mn [manganese]; Compazine [prochlorperazine]; Corticosteroids; Eggs or egg-derived products; and Penicillins   Review of Systems Review of Systems  Constitutional: Positive for fever.  HENT: Positive for postnasal drip and sore throat.   Eyes: Negative.   Respiratory: Positive for cough, shortness of breath and wheezing.   Cardiovascular: Negative.   Gastrointestinal:       Patient has mild nausea but no vomiting.     Physical Exam Triage Vital Signs ED Triage Vitals  Enc Vitals Group     BP 10/24/17 1013 (!) 142/82     Pulse Rate 10/24/17 1013 85     Resp 10/24/17 1013 18     Temp 10/24/17 1013 98.7 F (37.1 C)     Temp Source 10/24/17 1013 Oral     SpO2 10/24/17 1013 96 %     Weight 10/24/17 1014 171 lb (77.6 kg)     Height 10/24/17 1014 5\' 4"  (1.626 m)     Head Circumference --      Peak Flow --      Pain Score 10/24/17 1014 0     Pain Loc --      Pain Edu? --      Excl. in [place]? --    No data found.  Updated Vital Signs BP (!) 142/82 (BP Location: Right Arm)   Pulse 85   Temp 98.7 F (37.1 C) (Oral)   Resp 18   Ht 5\' 4"  (1.626 m)   Wt 171 lb (77.6 kg)   SpO2 96%   BMI 29.35 kg/m   Visual Acuity Right Eye Distance:   Left Eye Distance:   Bilateral Distance:    Right Eye Near:   Left Eye Near:    Bilateral Near:     Physical Exam  Constitutional:  Alert and cooperative in no distress.  HENT:  Mouth/Throat: No oropharyngeal exudate.  Neck: Normal range of motion.  Cardiovascular: Normal rate and regular rhythm.  Pulmonary/Chest:  Breath sounds are symmetrical.  There are bilateral wheezes noted.  She is in no respiratory distress.  Abdominal: Soft.     UC Treatments / Results  Labs (all labs ordered are listed, but only abnormal results are displayed) Labs Reviewed - No data to display  EKG None Radiology No results found.  Procedures Procedures (including critical care time)  Medications Ordered in  UC Medications  ipratropium-albuterol (DUONEB) 0.5-2.5 (3) MG/3ML nebulizer solution 3 mL (3 mLs Nebulization Given 10/24/17 1137)     Initial Impression / Assessment and Plan / UC Course  I have reviewed the triage vital signs and the nursing notes.  Pertinent labs & imaging results that were available during  my care of the patient were reviewed by me and considered in my medical decision making (see chart for details).     Patient improved after her nebulizer treatment.  I sent in a prescription for duo nebs to use instead of her Combivent inhaler.  I did not place her on antibiotics at the present time.  She has a history of sensitivity to steroids and these were not prescribed.  Final Clinical Impressions(s) / UC Diagnoses   Final diagnoses:  COPD exacerbation ([place])  Nausea without vomiting    ED Discharge Orders        Ordered    ipratropium-albuterol (DUONEB) 0.5-2.5 (3) MG/3ML SOLN    Note to Pharmacy:  **Patient requests 90 days supply**   10/24/17 1221       Controlled Substance Prescriptions [place] Controlled Substance Registry consulted? Not Applicable   Darlyne Russian, MD 10/24/17 1431

## 2017-10-24 NOTE — Telephone Encounter (Signed)
Ok to refill? Last refilled in 2017.

## 2017-10-24 NOTE — Telephone Encounter (Signed)
Refill request for Ipratropium-Albuterol has been sent to [facility]. Rhegan Trunnell,CMA

## 2017-10-24 NOTE — Discharge Instructions (Addendum)
Drink good quantities of fluids. Take your Zofran for nausea as needed. I have called in a prescription for your duo nebs. Do not use your Combivent when you are doing your duo nebs

## 2017-10-24 NOTE — ED Triage Notes (Signed)
Patient states congestion, cough, some nausea and diarrhea for past 5 days; taking cordicidine and suspects a seasonal allergy component.

## 2017-10-24 NOTE — Telephone Encounter (Signed)
Pt called. She is needing refill on her Albuterol.

## 2017-10-28 ENCOUNTER — Ambulatory Visit (INDEPENDENT_AMBULATORY_CARE_PROVIDER_SITE_OTHER): Payer: Medicare Other | Admitting: Family Medicine

## 2017-10-28 ENCOUNTER — Encounter: Payer: Self-pay | Admitting: Family Medicine

## 2017-10-28 VITALS — BP 165/78 | HR 66 | Temp 98.0°F | Wt 177.0 lb

## 2017-10-28 DIAGNOSIS — I10 Essential (primary) hypertension: Secondary | ICD-10-CM | POA: Diagnosis not present

## 2017-10-28 DIAGNOSIS — J441 Chronic obstructive pulmonary disease with (acute) exacerbation: Secondary | ICD-10-CM | POA: Diagnosis not present

## 2017-10-28 MED ORDER — FLUTICASONE-SALMETEROL 230-21 MCG/ACT IN AERO: 2.0000 | INHALATION_SPRAY | Freq: Two times a day (BID) | RESPIRATORY_TRACT | 12 refills | Status: DC

## 2017-10-28 NOTE — Progress Notes (Signed)
Kimberly Jackson is a 72 y.o. female who presents to [place]: [place] today for cough.   The patient was seen several times in the office over the last 2 months for vomiting, dehydration, and potential acute kidney injury. Today she is reporting symptoms of a COPD exacerbation. She began coughing and feeling short of breath last Friday. She was seen in urgent care and given a breathing treatment. She stopped all medications except for Combivent every 6 hours at that time.   Since then, she has improved mildly and is not in severe distress. However, she still remains wheezy, coughing, and has increased work of breathing. She denies taking allergy medications. She reports headache and back pain that she attributes to the persistent coughing.  She does note that she had episodes of nausea and diarrhea last week. They subsided after taking Zofran and stopping her codeine medication. She has not had diarrhea since Friday.   The patient notes that drevious trials of prednisone have resulted in psychiatric disturbances.    Past Medical History:  Diagnosis Date  . Alcohol abuse   . COPD (chronic obstructive pulmonary disease) ([place])   . Depression   . Reflux    Past Surgical History:  Procedure Laterality Date  . ABDOMINAL HYSTERECTOMY    . APPENDECTOMY     72 yo  . BLADDER SURGERY  2015  . CHOLECYSTECTOMY    . REPLACEMENT TOTAL KNEE    . ROTATOR CUFF REPAIR    . TONSILLECTOMY     Social History   Tobacco Use  . Smoking status: Never Smoker  . Smokeless tobacco: Never Used  Substance Use Topics  . Alcohol use: No    Alcohol/week: 0.0 oz   family history includes Alcohol abuse in her mother.  ROS as above:  Medications: Current Outpatient Medications  Medication Sig Dispense Refill  . AMBULATORY NON FORMULARY MEDICATION Blood pressure monitor use daily for  hypertension. 1 Device 0  . Cholecalciferol (VITAMIN D3) 5000 units CAPS Take 5,000 Units by mouth daily.    . diclofenac sodium (VOLTAREN) 1 % GEL Apply 2 g 4 (four) times daily topically. To affected joint. 100 g 11  . DIGOX 125 MCG tablet TAKE 1 TABLET BY MOUTH EVERY DAY 90 tablet 1  . fluticasone (FLONASE) 50 MCG/ACT nasal spray SPRAY 1 SPRAY IN EACH NOSTRILS BID  0  . ipratropium (ATROVENT) 0.06 % nasal spray Place 2 sprays into both nostrils every 4 (four) hours as needed for rhinitis. 10 mL 6  . Ipratropium-Albuterol (COMBIVENT) 20-100 MCG/ACT AERS respimat Inhale 1 puff into the lungs every 6 (six) hours as needed for wheezing or shortness of breath. 1 Inhaler 5  . ipratropium-albuterol (DUONEB) 0.5-2.5 (3) MG/3ML SOLN Use 1 every 6 hours as needed for wheezing 360 mL 1  . lisinopril (PRINIVIL,ZESTRIL) 10 MG tablet Take 1 tablet (10 mg total) daily by mouth. 30 tablet 3  . metoprolol succinate (TOPROL-XL) 100 MG 24 hr tablet TAKE 1 TABLET BY MOUTH DAILY. TAKE WITH OR IMMEDIATELY FOLLOWING A MEAL. 90 tablet 1  . omeprazole (PRILOSEC) 40 MG capsule Take 1 capsule (40 mg total) by mouth daily. 90 capsule 3  . ondansetron (ZOFRAN) 4 MG tablet Take 1 tablet (4 mg total) by mouth every 8 (eight) hours as needed for nausea or vomiting. 20 tablet 0  . triamterene-hydrochlorothiazide (MAXZIDE-25) 37.5-25 MG tablet Take 1 tablet daily by mouth. 30 tablet 3  .  fluticasone-salmeterol (ADVAIR HFA) 230-21 MCG/ACT inhaler Inhale 2 puffs into the lungs 2 (two) times daily. 1 Inhaler 12   No current facility-administered medications for this visit.    Allergies  Allergen Reactions  . Cobalt-Mn [Manganese]   . Compazine [Prochlorperazine]   . Corticosteroids   . Eggs Or Egg-Derived Products   . Penicillins     Health Maintenance Health Maintenance  Topic Date Due  . INFLUENZA VACCINE  02/19/2018  . MAMMOGRAM  03/07/2019  . COLONOSCOPY  09/16/2022  . TETANUS/TDAP  11/19/2024  . DEXA SCAN   Completed  . Hepatitis C Screening  Completed  . PNA vac Low Risk Adult  Completed     Exam:  BP (!) 165/78   Pulse 66   Temp 98 F (36.7 C) (Oral)   Wt 177 lb 0.6 oz (80.3 kg)   SpO2 100%   BMI 30.39 kg/m  Gen: Well NAD, non toxic appearing  HEENT: EOMI,  MMM Lungs: Normal work of breathing. Mild expiratory wheezes bilaterally in all lung fields. Non-productive cough during exam.  Heart: RRR no MRG Abd: NABS, Soft. Nondistended, Nontender Exts: Brisk capillary refill, warm and well perfused.    No results found for this or any previous visit (from the past 72 hour(s)). No results found.    Assessment and Plan: 72 y.o. female with cough.  The patient's symptoms coinciding with drastic weather changes and increased pollen make COPD exacerbation the most likely diagnosis. The patient is currently only taking nebulizer every 6 hours. She needs to use an ICS in order to control this inflammation. I am prescribing Advair to be used 2 times daily. We may move to using oral steroids, but the patient's history of psychosis with prednisone makes it something we want to avoid.  Symptom control with tessalon and codeine.   She should also restart her blood pressure medication and other necessary medications. Her BP was 165/78 today, which is uncontrolled.   Restart when feeling better.   Recheck on April 24th.   No orders of the defined types were placed in this encounter.  Meds ordered this encounter  Medications  . fluticasone-salmeterol (ADVAIR HFA) 230-21 MCG/ACT inhaler    Sig: Inhale 2 puffs into the lungs 2 (two) times daily.    Dispense:  1 Inhaler    Refill:  12     Discussed warning signs or symptoms. Please see discharge instructions. Patient expresses understanding.

## 2017-10-28 NOTE — Patient Instructions (Addendum)
Thank you for coming in today. Restart Advair inhaler twice daily.  Ok to use tessalon and codeine for cough.  If not getting better let me know.   Recheck as scheduled on the 24th.    Chronic Obstructive Pulmonary Disease Exacerbation Chronic obstructive pulmonary disease (COPD) is a long-term (chronic) condition that affects the lungs. COPD is a general term that can be used to describe many different lung problems that cause lung swelling (inflammation) and limit airflow, including chronic bronchitis and emphysema. COPD exacerbations are episodes when breathing symptoms become much worse and require extra treatment. COPD exacerbations are usually caused by infections. Without treatment, COPD exacerbations can be severe and even life threatening. Frequent COPD exacerbations can cause further damage to the lungs. What are the causes? This condition may be caused by:  Respiratory infections, including viral and bacterial infections.  Exposure to smoke.  Exposure to air pollution, chemical fumes, or dust.  Things that give you an allergic reaction (allergens).  Not taking your usual COPD medicines as directed.  Underlying medical problems, such as congestive heart failure or infections not involving the lungs.  In many cases, the cause (trigger) of this condition is not known. What increases the risk? The following factors may make you more likely to develop this condition:  Smoking cigarettes.  Old age.  Frequent prior COPD exacerbations.  What are the signs or symptoms? Symptoms of this condition include:  Increased coughing.  Increased production of mucus from your lungs (sputum).  Increased wheezing.  Increased shortness of breath.  Rapid or labored breathing.  Chest tightness.  Less energy than usual.  Sleep disruption from symptoms.  Confusion or increased sleepiness.  Often these symptoms happen or get worse even with the use of medicines. How is this  diagnosed? This condition is diagnosed based on:  Your medical history.  A physical exam.  You may also have tests, including:  A chest X-ray.  Blood tests.  Lung (pulmonary) function tests.  How is this treated? Treatment for this condition depends on the severity and cause of the symptoms. You may need to be admitted to a hospital for treatment. Some of the treatments commonly used to treat COPD exacerbations are:  Antibiotic medicines. These may be used for severe exacerbations caused by a lung infection, such as pneumonia.  Bronchodilators. These are inhaled medicines that expand the air passages and allow increased airflow.  Steroid medicines. These act to reduce inflammation in the airways. They may be given with an inhaler, taken by mouth, or given through an IV tube inserted into one of your veins.  Supplemental oxygen therapy.  Airway clearing techniques, such as noninvasive ventilation (NIV) and positive expiratory pressure (PEP). These provide respiratory support through a mask or other noninvasive device. An example of this would be using a continuous positive airway pressure (CPAP) machine to improve delivery of oxygen into your lungs.  Follow these instructions at home: Medicines  Take over-the-counter and prescription medicines only as told by your health care provider. It is important to use correct technique with inhaled medicines.  If you were prescribed an antibiotic medicine or oral steroid, take it as told by your health care provider. Do not stop taking the medicine even if you start to feel better. Lifestyle  Eat a healthy diet.  Exercise regularly.  Get plenty of sleep.  Avoid exposure to all substances that irritate the airway, especially to tobacco smoke.  Wash your hands often with soap and water to reduce the  risk of infection. If soap and water are not available, use hand sanitizer.  During flu season, avoid enclosed spaces that are crowded  with people. General instructions  Drink enough fluid to keep your urine clear or pale yellow (unless you have a medical condition that requires fluid restriction).  Use a cool mist vaporizer. This humidifies the air and makes it easier for you to clear your chest when you cough.  If you have a home nebulizer and oxygen, continue to use them as told by your health care provider.  Keep all follow-up visits as told by your health care provider. This is important. How is this prevented?  Stay up-to-date on pneumococcal and influenza (flu) vaccines. A flu shot is recommended every year to help prevent exacerbations.  Do not use any products that contain nicotine or tobacco, such as cigarettes and e-cigarettes. Quitting smoking is very important in preventing COPD from getting worse and in preventing exacerbations from happening as often. If you need help quitting, ask your health care provider.  Follow all instructions for pulmonary rehabilitation after a recent exacerbation. This can help prevent future exacerbations.  Work with your health care provider to develop and follow an action plan. This tells you what steps to take when you experience certain symptoms. Contact a health care provider if:  You have a worsening of your regular COPD symptoms. Get help right away if:  You have worsening shortness of breath, even when resting.  You have trouble talking.  You have severe chest pain.  You cough up blood.  You have a fever.  You have weakness, vomit repeatedly, or faint.  You feel confused.  You are not able to sleep because of your symptoms.  You have trouble doing daily activities. Summary  COPD exacerbations are episodes when breathing symptoms become much worse and require extra treatment above your normal treatment.  Exacerbations can be severe and even life threatening. Frequent COPD exacerbations can cause further damage to your lungs.  COPD exacerbations are  usually triggered by infections such as the flu, colds, and even pneumonia.  Treatment for this condition depends on the severity and cause of the symptoms. You may need to be admitted to a hospital for treatment.  Quitting smoking is very important to prevent COPD from getting worse and to prevent exacerbations from happening as often. This information is not intended to replace advice given to you by your health care provider. Make sure you discuss any questions you have with your health care provider. Document Released: 05/05/2007 Document Revised: 08/12/2016 Document Reviewed: 08/12/2016 Elsevier Interactive Patient Education  [employer].

## 2017-10-29 ENCOUNTER — Telehealth: Payer: Self-pay | Admitting: Family Medicine

## 2017-10-29 NOTE — Telephone Encounter (Signed)
-----   Message from Tasia Catchings, [place] sent at 10/29/2017  2:35 PM EDT ----- Regarding: FW: Orthovisc Information has been sent to Orthovisc and awaiting determination.  ----- Message ----- From: Gregor Hams, MD Sent: 10/28/2017   1:24 PM To: Tasia Catchings, CMA Subject: Orthovisc                                      Please figure out gel shots like orthovisc for Ms Kimberly Jackson

## 2017-11-06 ENCOUNTER — Encounter: Payer: Self-pay | Admitting: Family Medicine

## 2017-11-06 ENCOUNTER — Ambulatory Visit (INDEPENDENT_AMBULATORY_CARE_PROVIDER_SITE_OTHER): Payer: Medicare Other | Admitting: Family Medicine

## 2017-11-06 VITALS — BP 138/85 | HR 64 | Temp 97.3°F | Ht 64.0 in | Wt 175.0 lb

## 2017-11-06 DIAGNOSIS — J441 Chronic obstructive pulmonary disease with (acute) exacerbation: Secondary | ICD-10-CM

## 2017-11-06 DIAGNOSIS — M1711 Unilateral primary osteoarthritis, right knee: Secondary | ICD-10-CM

## 2017-11-06 DIAGNOSIS — R221 Localized swelling, mass and lump, neck: Secondary | ICD-10-CM

## 2017-11-06 NOTE — Progress Notes (Signed)
[place] Kimberly Jackson is a 72 y.o. female who presents to [facility]: [place] today for breathing.  K notes improved breathing from last week.  She notes some continued cough and shortness of breath.  She notes her shortness of breath is worse with exertion.  She is not 100% better but is better from last visit.    Additionally she notes continued knee pain.  She was approved for Orthovisc but her out-of-pocket cost is going to be around $800 and declines further injections.  She is intolerant to steroids therefore we are avoiding cortisone injections.  She is used diclofenac gel and has some benefit.  She notes continued pain that is moderately bothersome which does interfere with her quality of life.  Additionally she notes continued swollen lymph nodes.  These been present now for over a month and submandibular region.  She does not think they are growing or getting smaller but are still quite large and present.  She denies any trouble swallowing.  No fevers or chills or unexpected or unplanned weight loss.   Past Medical History:  Diagnosis Date  . Alcohol abuse   . COPD (chronic obstructive pulmonary disease) ([place])   . Depression   . Reflux    Past Surgical History:  Procedure Laterality Date  . ABDOMINAL HYSTERECTOMY    . APPENDECTOMY     72 yo  . BLADDER SURGERY  2015  . CHOLECYSTECTOMY    . REPLACEMENT TOTAL KNEE    . ROTATOR CUFF REPAIR    . TONSILLECTOMY     Social History   Tobacco Use  . Smoking status: Never Smoker  . Smokeless tobacco: Never Used  Substance Use Topics  . Alcohol use: No    Alcohol/week: 0.0 oz   family history includes Alcohol abuse in her mother.  ROS as above:  Medications: Current Outpatient Medications  Medication Sig Dispense Refill  . AMBULATORY NON FORMULARY MEDICATION Blood pressure monitor use daily for hypertension. 1 Device 0   . Cholecalciferol (VITAMIN D3) 5000 units CAPS Take 5,000 Units by mouth daily.    . diclofenac sodium (VOLTAREN) 1 % GEL Apply 2 g 4 (four) times daily topically. To affected joint. 100 g 11  . DIGOX 125 MCG tablet TAKE 1 TABLET BY MOUTH EVERY DAY 90 tablet 1  . fluticasone (FLONASE) 50 MCG/ACT nasal spray SPRAY 1 SPRAY IN EACH NOSTRILS BID  0  . fluticasone-salmeterol (ADVAIR HFA) 230-21 MCG/ACT inhaler Inhale 2 puffs into the lungs 2 (two) times daily. 1 Inhaler 12  . ipratropium (ATROVENT) 0.06 % nasal spray Place 2 sprays into both nostrils every 4 (four) hours as needed for rhinitis. 10 mL 6  . Ipratropium-Albuterol (COMBIVENT) 20-100 MCG/ACT AERS respimat Inhale 1 puff into the lungs every 6 (six) hours as needed for wheezing or shortness of breath. 1 Inhaler 5  . ipratropium-albuterol (DUONEB) 0.5-2.5 (3) MG/3ML SOLN Use 1 every 6 hours as needed for wheezing 360 mL 1  . lisinopril (PRINIVIL,ZESTRIL) 10 MG tablet Take 1 tablet (10 mg total) daily by mouth. 30 tablet 3  . metoprolol succinate (TOPROL-XL) 100 MG 24 hr tablet TAKE 1 TABLET BY MOUTH DAILY. TAKE WITH OR IMMEDIATELY FOLLOWING A MEAL. 90 tablet 1  . omeprazole (PRILOSEC) 40 MG capsule Take 1 capsule (40 mg total) by mouth daily. 90 capsule 3  . ondansetron (ZOFRAN) 4 MG tablet Take 1 tablet (4 mg total) by mouth every 8 (eight) hours  as needed for nausea or vomiting. 20 tablet 0  . triamterene-hydrochlorothiazide (MAXZIDE-25) 37.5-25 MG tablet Take 1 tablet daily by mouth. 30 tablet 3   No current facility-administered medications for this visit.    Allergies  Allergen Reactions  . Cobalt-Mn [Manganese]   . Compazine [Prochlorperazine]   . Corticosteroids   . Eggs Or Egg-Derived Products   . Penicillins     Health Maintenance Health Maintenance  Topic Date Due  . INFLUENZA VACCINE  02/19/2018  . MAMMOGRAM  03/07/2019  . COLONOSCOPY  09/16/2022  . TETANUS/TDAP  11/19/2024  . DEXA SCAN  Completed  . Hepatitis C  Screening  Completed  . PNA vac Low Risk Adult  Completed     Exam:  BP 138/85   Pulse 64   Temp (!) 97.3 F (36.3 C) (Oral)   Ht 5\' 4"  (1.626 m)   Wt 175 lb (79.4 kg)   BMI 30.04 kg/m  Gen: Well NAD HEENT: EOMI,  MMM large submandibular lymph nodes bilaterally greater than 1 cm no tenderness mildly mobile. Lungs: Normal work of breathing. CTABL Heart: RRR no MRG Abd: NABS, Soft. Nondistended, Nontender Exts: Brisk capillary refill, warm and well perfused.  Right knee mild effusion no erythema.  Range of motion 0-100 degrees with crepitations.  Mildly tender. Antalgic gait.  EXAM: RIGHT KNEE - COMPLETE 4+ VIEW  COMPARISON:  None.  FINDINGS: Mild to moderate patellofemoral joint degenerative changes.  No fracture or dislocation.  Soft tissues unremarkable.  IMPRESSION: Mild to moderate patellofemoral joint degenerative changes.   Electronically Signed   By: Genia Del M.D.   On: 09/01/2017 12:40 I personally (independently) visualized and performed the interpretation of the images attached in this note.    Chemistry      Component Value Date/Time   NA 138 10/13/2017 0824   K 4.3 10/13/2017 0824   CL 103 10/13/2017 0824   CO2 29 10/13/2017 0824   BUN 14 10/13/2017 0824   CREATININE 0.85 10/13/2017 0824      Component Value Date/Time   CALCIUM 9.4 10/13/2017 0824   CALCIUM 9.4 10/13/2017 0824   ALKPHOS 115 12/13/2016 0854   AST 17 10/06/2017 0906   ALT 15 10/06/2017 0906   BILITOT 0.4 10/06/2017 0906        Assessment and Plan: 73 y.o. female with  Breathing: Improving but still not at baseline.  Continue current regimen and if not better in 2 weeks next step would be adding LAMA therapy likely Spiriva Respimat.  Knee pain: Discussed treatment options.  Plan for trial of water aerobics.  Consider physical therapy referral.  Avoid injections for this time.  Submandibular lymph nodes: Not improving after 1 months of watchful waiting.  Plan  for CT scan of the neck.  We will have to use IV contrast.  Kidney function normal on recheck in February.   Orders Placed This Encounter  Procedures  . CT Soft Tissue Neck W Contrast    Standing Status:   Future    Standing Expiration Date:   02/06/2019    Order Specific Question:   If indicated for the ordered procedure, I authorize the administration of contrast media per Radiology protocol    Answer:   Yes    Order Specific Question:   Preferred imaging location?    Answer:   [facility] Specific Question:   Radiology Contrast Protocol - do NOT remove file path    Answer:   [email or web address]  No orders of the defined types were placed in this encounter.    Discussed warning signs or symptoms. Please see discharge instructions. Patient expresses understanding.

## 2017-11-06 NOTE — Telephone Encounter (Signed)
Orthovisc is covered. There is no copay. After the deductible has been met and the patient's responsibility is 20% of the allowable amount. The out of pocket does not apply. Call reference number is IVR  Patient's part is around $800 and she will hold off on this injection at this time.

## 2017-11-06 NOTE — Patient Instructions (Signed)
Thank you for coming in today. Continue current medicines for COPD.  If not better in 1-2 weeks contact me and I will add Spiriva respimat in addition.  We will keep thinking about knee pain.  Continue diclofenac gel and tylenol for pain.  Add water aerobics and PT.  Ask the [facility] and your insurance if you are covered with the silver sneakers.  We can also consider formal physical therpay.  Exercise bike will also help.  Recheck

## 2017-11-12 ENCOUNTER — Ambulatory Visit: Payer: Medicare Other | Admitting: Family Medicine

## 2017-11-16 ENCOUNTER — Other Ambulatory Visit: Payer: Self-pay | Admitting: Family Medicine

## 2017-11-17 ENCOUNTER — Encounter: Payer: Self-pay | Admitting: Family Medicine

## 2017-11-17 DIAGNOSIS — R221 Localized swelling, mass and lump, neck: Secondary | ICD-10-CM | POA: Diagnosis not present

## 2017-11-17 DIAGNOSIS — J320 Chronic maxillary sinusitis: Secondary | ICD-10-CM | POA: Diagnosis not present

## 2017-11-17 DIAGNOSIS — M47892 Other spondylosis, cervical region: Secondary | ICD-10-CM | POA: Diagnosis not present

## 2017-11-17 LAB — BASIC METABOLIC PANEL: Creatinine: 0.9 (ref <=1.1)

## 2017-11-19 ENCOUNTER — Telehealth: Payer: Self-pay | Admitting: Family Medicine

## 2017-11-19 ENCOUNTER — Other Ambulatory Visit: Payer: Self-pay | Admitting: Family Medicine

## 2017-11-19 DIAGNOSIS — I10 Essential (primary) hypertension: Secondary | ICD-10-CM

## 2017-11-19 NOTE — Telephone Encounter (Signed)
CT scan from [facility] was normal appearing. Nothing concerning.  Arthritis in the cervical spine noted.

## 2017-11-19 NOTE — Telephone Encounter (Signed)
Spoke to patient and advised her of results as noted below. Rhonda Cunningham,CMA

## 2017-11-21 ENCOUNTER — Encounter: Payer: Self-pay | Admitting: Family Medicine

## 2017-11-24 ENCOUNTER — Encounter: Payer: Medicare Other | Admitting: Family Medicine

## 2017-12-11 ENCOUNTER — Ambulatory Visit (INDEPENDENT_AMBULATORY_CARE_PROVIDER_SITE_OTHER): Payer: Medicare Other

## 2017-12-11 ENCOUNTER — Encounter: Payer: Self-pay | Admitting: Family Medicine

## 2017-12-11 ENCOUNTER — Ambulatory Visit (INDEPENDENT_AMBULATORY_CARE_PROVIDER_SITE_OTHER): Payer: Medicare Other | Admitting: Family Medicine

## 2017-12-11 VITALS — BP 136/80 | HR 67 | Temp 98.7°F | Wt 178.0 lb

## 2017-12-11 DIAGNOSIS — J0101 Acute recurrent maxillary sinusitis: Secondary | ICD-10-CM

## 2017-12-11 DIAGNOSIS — R05 Cough: Secondary | ICD-10-CM

## 2017-12-11 DIAGNOSIS — J441 Chronic obstructive pulmonary disease with (acute) exacerbation: Secondary | ICD-10-CM

## 2017-12-11 MED ORDER — GUAIFENESIN-CODEINE 100-10 MG/5ML PO SOLN: 5.0000 mL | Freq: Four times a day (QID) | ORAL | 0 refills | Status: DC | PRN

## 2017-12-11 MED ORDER — CEFDINIR 300 MG PO CAPS: 300.0000 mg | ORAL_CAPSULE | Freq: Two times a day (BID) | ORAL | 0 refills | Status: DC

## 2017-12-11 NOTE — Progress Notes (Signed)
Kimberly Jackson is a 72 y.o. female who presents to [facility] today for cough congestion runny nose wheezing.  Kimberly Jackson is exposed to a viral illness about 2 weeks ago.  Both her and her husband developed diarrhea cough and congestion.  She notes the diarrhea has significant improved but she continues to experience cough congestion wheezing facial pressure and shortness of breath.  She has a history of COPD which is currently managed with Advair HFA 230/21 2 puffs twice daily, Combivent inhaler as needed, DuoNeb nebulizer as needed.  She notes that she is wheezing and coughing today and having to use her nebulizer a bit more than usual.  She notes that she does not have a pulmonologist.  She notes in the past she is done well for cough suppression with Tessalon Perles.   ROS as above:  Exam:  BP 136/80   Pulse 67   Temp 98.7 F (37.1 C) (Oral)   Wt 178 lb (80.7 kg)   BMI 30.55 kg/m  Gen: Well NAD HEENT: EOMI,  MMM tender palpation maxillary sinus.  Inflamed nasal turbinates.  Posterior pharynx with mild cobblestoning.  Cervical lymphadenopathy present bilaterally. Lungs: Normal work of breathing.  Coarse breath sounds and wheezing bilaterally. Heart: RRR no MRG Abd: NABS, Soft. Nondistended, Nontender Exts: Brisk capillary refill, warm and well perfused.   Lab and Radiology Results  My personal interpretation of 2 view chest x-ray images Bronchitic pattern with mild hyperinflation no acute infiltrate Awaiting formal radiology review   Assessment and Plan: 72 y.o. female with  Sinus pain and pressure cough congestion and wheezing. Patient likely has the resolving symptoms of a viral URI with exacerbation of underlying COPD. Plan to continue conservative management and symptomatic treatment.  Plan to continue current regimen but add codeine cough syrup and Tessalon  Perles.  Use back-up dose of antibiotics if not improving.  Avoid oral prednisone due to history of suicidality on systemic steroids in the past.  Underlying COPD: Patient seems to be worsening with more exacerbations.  When controlled she denies any significant trouble breathing therefore I am not sure that LAMA therapy will be very helpful.  I think it is reasonable to refer to pulmonology to help with the diagnosis and fill in any missing gaps.    Recheck in the near future.   Orders Placed This Encounter  Procedures  . DG Chest 2 View    Order Specific Question:   Reason for exam:    Answer:   Cough, assess intra-thoracic pathology    Order Specific Question:   Preferred imaging location?    Answer:   [facility]  . Ambulatory referral to Pulmonology    Referral Priority:   Routine    Referral Type:   Consultation    Referral Reason:   Specialty Services Required    Requested Specialty:   Pulmonary Disease    Number of Visits Requested:   1   Meds ordered this encounter  Medications  . guaiFENesin-codeine 100-10 MG/5ML syrup    Sig: Take 5 mLs by mouth every 6 (six) hours as needed for cough.    Dispense:  120 mL    Refill:  0  . cefdinir (OMNICEF) 300 MG capsule    Sig: Take 1 capsule (300 mg total) by mouth 2 (two) times daily.    Dispense:  14 capsule    Refill:  0     Historical information  moved to improve visibility of documentation.  Past Medical History:  Diagnosis Date  . Alcohol abuse   . COPD (chronic obstructive pulmonary disease) ([place])   . Depression   . Reflux    Past Surgical History:  Procedure Laterality Date  . ABDOMINAL HYSTERECTOMY    . APPENDECTOMY     72 yo  . BLADDER SURGERY  2015  . CHOLECYSTECTOMY    . REPLACEMENT TOTAL KNEE    . ROTATOR CUFF REPAIR    . TONSILLECTOMY     Social History   Tobacco Use  . Smoking status: Never Smoker  . Smokeless tobacco: Never Used  Substance Use Topics  . Alcohol use: No     Alcohol/week: 0.0 oz   family history includes Alcohol abuse in her mother.  Medications: Current Outpatient Medications  Medication Sig Dispense Refill  . AMBULATORY NON FORMULARY MEDICATION Blood pressure monitor use daily for hypertension. 1 Device 0  . Cholecalciferol (VITAMIN D3) 5000 units CAPS Take 5,000 Units by mouth daily.    . diclofenac sodium (VOLTAREN) 1 % GEL Apply 2 g 4 (four) times daily topically. To affected joint. 100 g 11  . DIGOX 125 MCG tablet TAKE 1 TABLET BY MOUTH EVERY DAY 90 tablet 1  . fluticasone (FLONASE) 50 MCG/ACT nasal spray SPRAY 1 SPRAY IN EACH NOSTRILS BID  0  . fluticasone-salmeterol (ADVAIR HFA) 230-21 MCG/ACT inhaler Inhale 2 puffs into the lungs 2 (two) times daily. 1 Inhaler 12  . ipratropium (ATROVENT) 0.06 % nasal spray Place 2 sprays into both nostrils every 4 (four) hours as needed for rhinitis. 10 mL 6  . Ipratropium-Albuterol (COMBIVENT) 20-100 MCG/ACT AERS respimat Inhale 1 puff into the lungs every 6 (six) hours as needed for wheezing or shortness of breath. 1 Inhaler 5  . ipratropium-albuterol (DUONEB) 0.5-2.5 (3) MG/3ML SOLN Use 1 every 6 hours as needed for wheezing 360 mL 1  . lisinopril (PRINIVIL,ZESTRIL) 10 MG tablet TAKE 1 TABLET(10 MG) BY MOUTH DAILY 30 tablet 0  . metoprolol succinate (TOPROL-XL) 100 MG 24 hr tablet TAKE 1 TABLET BY MOUTH DAILY. TAKE WITH OR IMMEDIATELY FOLLOWING A MEAL. 90 tablet 1  . omeprazole (PRILOSEC) 40 MG capsule Take 1 capsule (40 mg total) by mouth daily. 90 capsule 3  . ondansetron (ZOFRAN) 4 MG tablet Take 1 tablet (4 mg total) by mouth every 8 (eight) hours as needed for nausea or vomiting. 20 tablet 0  . triamterene-hydrochlorothiazide (MAXZIDE-25) 37.5-25 MG tablet TAKE 1 TABLET BY MOUTH DAILY 30 tablet 0  . cefdinir (OMNICEF) 300 MG capsule Take 1 capsule (300 mg total) by mouth 2 (two) times daily. 14 capsule 0  . guaiFENesin-codeine 100-10 MG/5ML syrup Take 5 mLs by mouth every 6 (six) hours as needed  for cough. 120 mL 0   No current facility-administered medications for this visit.    Allergies  Allergen Reactions  . Corticosteroids Other (See Comments)    Suicidal  . Compazine [Prochlorperazine] Other (See Comments)    Acute dystonic reaction type  . Eggs Or Egg-Derived Products   . Nickel Rash    skin  . Penicillins Rash    Health Maintenance Health Maintenance  Topic Date Due  . INFLUENZA VACCINE  02/19/2018  . MAMMOGRAM  03/07/2019  . COLONOSCOPY  09/16/2022  . TETANUS/TDAP  11/19/2024  . DEXA SCAN  Completed  . Hepatitis C Screening  Completed  . PNA vac Low Risk Adult  Completed    Discussed warning signs or symptoms. Please  see discharge instructions. Patient expresses understanding.

## 2017-12-11 NOTE — Patient Instructions (Signed)
Thank you for coming in today. Get xray now.  Use the codeine cough medicine as needed.  You should hear form pulmonology.  We will see each other next week.     Cough, Adult A cough helps to clear your throat and lungs. A cough may last only 2-3 weeks (acute), or it may last longer than 8 weeks (chronic). Many different things can cause a cough. A cough may be a sign of an illness or another medical condition. Follow these instructions at home:  Pay attention to any changes in your cough.  Take medicines only as told by your doctor. ? If you were prescribed an antibiotic medicine, take it as told by your doctor. Do not stop taking it even if you start to feel better. ? Talk with your doctor before you try using a cough medicine.  Drink enough fluid to keep your pee (urine) clear or pale yellow.  If the air is dry, use a cold steam vaporizer or humidifier in your home.  Stay away from things that make you cough at work or at home.  If your cough is worse at night, try using extra pillows to raise your head up higher while you sleep.  Do not smoke, and try not to be around smoke. If you need help quitting, ask your doctor.  Do not have caffeine.  Do not drink alcohol.  Rest as needed. Contact a doctor if:  You have new problems (symptoms).  You cough up yellow fluid (pus).  Your cough does not get better after 2-3 weeks, or your cough gets worse.  Medicine does not help your cough and you are not sleeping well.  You have pain that gets worse or pain that is not helped with medicine.  You have a fever.  You are losing weight and you do not know why.  You have night sweats. Get help right away if:  You cough up blood.  You have trouble breathing.  Your heartbeat is very fast. This information is not intended to replace advice given to you by your health care provider. Make sure you discuss any questions you have with your health care provider. Document Released:  03/21/2011 Document Revised: 12/14/2015 Document Reviewed: 09/14/2014 Elsevier Interactive Patient Education  [employer].

## 2017-12-18 ENCOUNTER — Encounter: Payer: Self-pay | Admitting: Family Medicine

## 2017-12-18 ENCOUNTER — Ambulatory Visit (INDEPENDENT_AMBULATORY_CARE_PROVIDER_SITE_OTHER): Payer: Medicare Other | Admitting: Family Medicine

## 2017-12-18 VITALS — BP 133/84 | HR 61 | Ht 64.0 in | Wt 177.0 lb

## 2017-12-18 DIAGNOSIS — Z0000 Encounter for general adult medical examination without abnormal findings: Secondary | ICD-10-CM | POA: Diagnosis not present

## 2017-12-18 DIAGNOSIS — J449 Chronic obstructive pulmonary disease, unspecified: Secondary | ICD-10-CM

## 2017-12-18 DIAGNOSIS — G4733 Obstructive sleep apnea (adult) (pediatric): Secondary | ICD-10-CM | POA: Diagnosis not present

## 2017-12-18 DIAGNOSIS — I480 Paroxysmal atrial fibrillation: Secondary | ICD-10-CM | POA: Diagnosis not present

## 2017-12-18 MED ORDER — TIOTROPIUM BROMIDE MONOHYDRATE 18 MCG IN CAPS: 18.0000 ug | ORAL_CAPSULE | Freq: Every day | RESPIRATORY_TRACT | 3 refills | Status: DC

## 2017-12-18 NOTE — Progress Notes (Signed)
HPI: Kimberly Jackson is a 72 y.o. female  who presents to [facility] today, 12/18/17,  for Medicare Annual Wellness Exam  Patient presents for annual physical/Medicare wellness exam. Patient has been seen several times recently for cough.  She is thought to have COPD and was referred to pulmonology.  She notes that in the past she did use Spiriva and did find some benefit from it and would like to restart before her appointment with pulmonology on July 11.   Past medical, surgical, social and family history reviewed:  Patient Active Problem List   Diagnosis Date Noted  . Magnesium deficiency 10/06/2017  . Low calcium levels 10/06/2017  . Poor venous access 09/30/2017  . Right knee DJD 09/10/2017  . Left shoulder pain 12/09/2016  . OSA and COPD overlap syndrome ([place]) 07/05/2016  . Right foot pain 12/07/2015  . Vitamin D deficiency 09/11/2015  . Prediabetes 09/08/2015  . Osteopenia 09/08/2015  . Ventral hernia 09/08/2015  . COPD (chronic obstructive pulmonary disease) ([place]) 09/04/2015  . Paroxysmal A-fib ([place]) 09/04/2015  . HTN (hypertension) 09/04/2015  . Long-term use of high-risk medication 09/04/2015    Past Surgical History:  Procedure Laterality Date  . ABDOMINAL HYSTERECTOMY    . APPENDECTOMY     72 yo  . BLADDER SURGERY  2015  . CHOLECYSTECTOMY    . REPLACEMENT TOTAL KNEE    . ROTATOR CUFF REPAIR    . TONSILLECTOMY      Social History   Socioeconomic History  . Marital status: Married    Spouse name: Not on file  . Number of children: Not on file  . Years of education: Not on file  . Highest education level: Not on file  Occupational History  . Not on file  Social Needs  . Financial resource strain: Not on file  . Food insecurity:    Worry: Not on file    Inability: Not on file  . Transportation needs:    Medical: Not on file    Non-medical: Not on file  Tobacco Use  . Smoking status: Never Smoker  . Smokeless  tobacco: Never Used  Substance and Sexual Activity  . Alcohol use: No    Alcohol/week: 0.0 oz  . Drug use: No  . Sexual activity: Not on file  Lifestyle  . Physical activity:    Days per week: Not on file    Minutes per session: Not on file  . Stress: Not on file  Relationships  . Social connections:    Talks on phone: Not on file    Gets together: Not on file    Attends religious service: Not on file    Active member of club or organization: Not on file    Attends meetings of clubs or organizations: Not on file    Relationship status: Not on file  . Intimate partner violence:    Fear of current or ex partner: Not on file    Emotionally abused: Not on file    Physically abused: Not on file    Forced sexual activity: Not on file  Other Topics Concern  . Not on file  Social History Narrative  . Not on file    Family History  Problem Relation Age of Onset  . Alcohol abuse Mother      Current medication list and allergy/intolerance information reviewed:    Outpatient Encounter Medications as of 12/18/2017  Medication Sig Note  . AMBULATORY NON FORMULARY MEDICATION Blood  pressure monitor use daily for hypertension.   . Cholecalciferol (VITAMIN D3) 5000 units CAPS Take 5,000 Units by mouth daily.   . diclofenac sodium (VOLTAREN) 1 % GEL Apply 2 g 4 (four) times daily topically. To affected joint.   [name] DIGOX 125 MCG tablet TAKE 1 TABLET BY MOUTH EVERY DAY   . fluticasone (FLONASE) 50 MCG/ACT nasal spray SPRAY 1 SPRAY IN EACH NOSTRILS BID 09/04/2015: Received from: External Pharmacy  . fluticasone-salmeterol (ADVAIR HFA) 230-21 MCG/ACT inhaler Inhale 2 puffs into the lungs 2 (two) times daily.   [name] ipratropium (ATROVENT) 0.06 % nasal spray Place 2 sprays into both nostrils every 4 (four) hours as needed for rhinitis.   . Ipratropium-Albuterol (COMBIVENT) 20-100 MCG/ACT AERS respimat Inhale 1 puff into the lungs every 6 (six) hours as needed for wheezing or shortness of breath.   [name]  ipratropium-albuterol (DUONEB) 0.5-2.5 (3) MG/3ML SOLN Use 1 every 6 hours as needed for wheezing   . lisinopril (PRINIVIL,ZESTRIL) 10 MG tablet TAKE 1 TABLET(10 MG) BY MOUTH DAILY   . metoprolol succinate (TOPROL-XL) 100 MG 24 hr tablet TAKE 1 TABLET BY MOUTH DAILY. TAKE WITH OR IMMEDIATELY FOLLOWING A MEAL.   [name] omeprazole (PRILOSEC) 40 MG capsule Take 1 capsule (40 mg total) by mouth daily.   . ondansetron (ZOFRAN) 4 MG tablet Take 1 tablet (4 mg total) by mouth every 8 (eight) hours as needed for nausea or vomiting.   . triamterene-hydrochlorothiazide (MAXZIDE-25) 37.5-25 MG tablet TAKE 1 TABLET BY MOUTH DAILY   . [DISCONTINUED] cefdinir (OMNICEF) 300 MG capsule Take 1 capsule (300 mg total) by mouth 2 (two) times daily.   . [DISCONTINUED] guaiFENesin-codeine 100-10 MG/5ML syrup Take 5 mLs by mouth every 6 (six) hours as needed for cough.   . tiotropium (SPIRIVA) 18 MCG inhalation capsule Place 1 capsule (18 mcg total) into inhaler and inhale daily.    No facility-administered encounter medications on file as of 12/18/2017.     Allergies  Allergen Reactions  . Corticosteroids Other (See Comments)    Suicidal  . Compazine [Prochlorperazine] Other (See Comments)    Acute dystonic reaction type  . Eggs Or Egg-Derived Products   . Nickel Rash    skin  . Penicillins Rash       Review of Systems: No headache, visual changes, nausea, vomiting, diarrhea, constipation, dizziness, abdominal pain, skin rash, fevers, chills, night sweats, weight loss, swollen lymph nodes, body aches, joint swelling, muscle aches, chest pain, shortness of breath, mood changes, visual or auditory hallucinations.     Medicare Wellness Questionnaire  Are there smokers in your home (other than you)? no  Depression screen [facility] 2/9 12/18/2017 10/13/2017 02/27/2017 02/06/2016  Decreased Interest 0 0 1 0  Down, Depressed, Hopeless 0 0 1 0  PHQ - 2 Score 0 0 2 0  Altered sleeping 0 0 - -  Tired, decreased energy 1 0 -  -  Change in appetite 0 0 - -  Feeling bad or failure about yourself  0 0 - -  Trouble concentrating 0 0 - -  Moving slowly or fidgety/restless 0 0 - -  Suicidal thoughts 0 0 - -  PHQ-9 Score 1 0 - -  Difficult doing work/chores Not difficult at all - - -        Activities of Daily Living In your present state of health, do you have any difficulty performing the following activities?:  Driving? no Managing money?  no Feeding yourself? no Getting from bed to  chair? no Climbing a flight of stairs? Yes --- shortness of breath Preparing food and eating?: no Bathing or showering? no Getting dressed: no Getting to the toilet? no Using the toilet: no Moving around from place to place: no In the past year have you fallen or had a near fall?: no  Hearing Difficulties:  Do you often ask people to speak up or repeat themselves? no Do you experience ringing or noises in your ears? yes  Do you have difficulty understanding soft or whispered voices? no  Memory Difficulties:  Do you feel that you have a problem with memory? no  Do you often misplace items? no  Do you feel safe at home?  yes  Sexual Health:   Are you sexually active?  Yes  Do you have more than one partner?  No   Risk Factors  Current exercise habits: Walking  Dietary issues discussed:yes   Cardiac risk factors: known cardiac disease   Exam:  BP 133/84   Pulse 61   Ht 5\' 4"  (1.626 m)   Wt 177 lb (80.3 kg)   BMI 30.38 kg/m  Vision by Snellen chart: right eye:see nurse notes, left eye:see nurse notes  Constitutional: VS see above. General Appearance: alert, well-developed, well-nourished, NAD  Ears, Nose, Mouth, Throat: MMM  Neck: No masses, trachea midline.   Respiratory: Normal respiratory effort. no wheeze, no rhonchi, no rales  Cardiovascular:No lower extremity edema.   Musculoskeletal: Gait normal. No clubbing/cyanosis of digits.   Neurological: Normal balance/coordination. No tremor. Recalls  3 objects and able to read face of watch with correct time.   Skin: warm, dry, intact. No rash/ulcer.   Psychiatric: Normal judgment/insight. Normal mood and affect. Oriented x3.     ASSESSMENT/PLAN:   Encounter for Medicare annual wellness exam  We will continue current COPD regimen but add Spiriva.  Follow-up with pulmonology.  Recheck with me as needed.  Health Maintenance Health Maintenance  Topic Date Due  . INFLUENZA VACCINE  02/19/2018  . MAMMOGRAM  03/07/2019  . COLONOSCOPY  09/16/2022  . TETANUS/TDAP  11/19/2024  . DEXA SCAN  Completed  . Hepatitis C Screening  Completed  . PNA vac Low Risk Adult  Completed    Immunization History  Administered Date(s) Administered  . Influenza, High Dose Seasonal PF 06/09/2017  . Influenza,inj,Quad PF,6+ Mos 09/03/2016  . Pneumococcal Conjugate-13 01/04/2016  . Pneumococcal Polysaccharide-23 04/21/2013  . Tdap 11/20/2014  . Zoster 01/04/2016     During the course of the visit the patient was educated and counseled about appropriate screening and preventive services as noted above.   Patient Instructions (the written plan) was given to the patient.  Medicare Attestation I have personally reviewed: The patient's medical and social history Their use of alcohol, tobacco or illicit drugs Their current medications and supplements The patient's functional ability including ADLs,fall risks, home safety risks, cognitive, and hearing and visual impairment Diet and physical activities Evidence for depression or mood disorders  The patient's weight, height, BMI, and visual acuity have been recorded in the chart.  I have made referrals, counseling, and provided education to the patient based on review of the above and I have provided the patient with a written personalized care plan for preventive services.

## 2017-12-18 NOTE — Patient Instructions (Signed)
Thank you for coming in today. Let me know if you want the injection.  Continue current treatment.  Add Spiriva. Recheck as needed.

## 2017-12-19 ENCOUNTER — Other Ambulatory Visit: Payer: Self-pay | Admitting: Family Medicine

## 2017-12-19 DIAGNOSIS — I10 Essential (primary) hypertension: Secondary | ICD-10-CM

## 2017-12-23 ENCOUNTER — Encounter: Payer: Medicare Other | Admitting: Family Medicine

## 2018-01-29 ENCOUNTER — Encounter: Payer: Self-pay | Admitting: Pulmonary Disease

## 2018-01-29 ENCOUNTER — Ambulatory Visit (INDEPENDENT_AMBULATORY_CARE_PROVIDER_SITE_OTHER): Payer: Medicare Other | Admitting: Pulmonary Disease

## 2018-01-29 VITALS — BP 146/67 | HR 62 | Ht 64.0 in | Wt 166.0 lb

## 2018-01-29 DIAGNOSIS — R0602 Shortness of breath: Secondary | ICD-10-CM

## 2018-01-29 DIAGNOSIS — J410 Simple chronic bronchitis: Secondary | ICD-10-CM

## 2018-01-29 NOTE — Patient Instructions (Signed)
Shortness of breath with severe case of bronchitis: I am going to evaluate for COPD with a breathing test called a full pulmonary function test In the meantime I like to continue treating you as if you have COPD until we rule that out so I like for you to take Advair twice a day and Spiriva daily no matter what If you are not feeling better on the next visit or if you feel worse then we will stop the lisinopril and change to another agent.  We may also consider performing a CT scan of your chest to evaluate for other lung problems.  Follow-up in 1 week with our nurse practitioner Tammy.

## 2018-01-29 NOTE — Progress Notes (Signed)
   Subjective:    Patient ID: Kimberly Jackson, female    DOB: 1945/12/05, 72 y.o.   MRN: [id number]  HPI    Review of Systems  Constitutional: Positive for appetite change, chills, fatigue, fever and unexpected weight change.  HENT: Positive for congestion, ear pain, postnasal drip and sore throat. Negative for dental problem, nosebleeds, rhinorrhea, sinus pressure, sneezing and trouble swallowing.   Eyes: Negative for redness and itching.  Respiratory: Positive for cough, chest tightness, shortness of breath and wheezing.   Cardiovascular: Positive for palpitations. Negative for leg swelling.  Gastrointestinal: Positive for nausea. Negative for vomiting.  Genitourinary: Negative for dysuria.  Musculoskeletal: Positive for joint swelling.  Skin: Negative for rash.  Allergic/Immunologic: Positive for environmental allergies. Negative for food allergies and immunocompromised state.  Neurological: Positive for headaches.  Hematological: Does not bruise/bleed easily.  Psychiatric/Behavioral: Negative for dysphoric mood. The patient is not nervous/anxious.        Objective:   Physical Exam        Assessment & Plan:

## 2018-01-29 NOTE — Progress Notes (Signed)
Synopsis: Referred in July 2019 for management of emphysema  Subjective:   PATIENT ID: Kimberly Jackson GENDER: female DOB: 01-10-1946, MRN: [id number]   HPI  Chief Complaint  Patient presents with  . pulm consult    Pt referred by Dr. Georgina Snell MD. Pt has SOB with exertion, productive cough yellow, wheezing, and some chest tightness since March 2019    [name] has been sick since March off and on and feels like she has had flu like symptoms for the entirety of the spring and summer.  She describes "flu like" symptoms.  At some point she has had fevers, chills, nausea, vomiting, loss of appetite.  Last week she says she just couldn't eat much.    She had a colonscopy and says that there was concern for aspiration during the procedure.  She had fever and chills.  This was in February and she wonders if this was the cause for her illness.  She says she has a persistent cough: > she says that this has improved a little this week but has been a persistent problem over the last few months > she coughs up white to yellow mucus > she says she had some tightness and wheezing up until last week as well   COPD: > she says that she has been aware of this for "years", but she says it never gave her problems until this year > she never smoked.   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   > she was told in the 1970's she had asthma > her parents smoked and both coughed, but no COPD, siblings didn't have lung problems    Past Medical History:  Diagnosis Date  . Alcohol abuse   . COPD (chronic obstructive pulmonary disease) ([place])   . Depression   . Reflux      Family History  Problem Relation Age of Onset  . Alcohol abuse Mother      Social History   Socioeconomic History  . Marital status: Married    Spouse name: Not on file  . Number of children: Not on file  . Years of education: Not on file  . Highest education level: Not on file    Occupational History  . Not on file  Social Needs  . Financial resource strain: Not on file  . Food insecurity:    Worry: Not on file    Inability: Not on file  . Transportation needs:    Medical: Not on file    Non-medical: Not on file  Tobacco Use  . Smoking status: Never Smoker  . Smokeless tobacco: Never Used  Substance and Sexual Activity  . Alcohol use: No    Alcohol/week: 0.0 oz  . Drug use: No  . Sexual activity: Not on file  Lifestyle  . Physical activity:    Days per week: Not on file    Minutes per session: Not on file  . Stress: Not on file  Relationships  . Social connections:    Talks on phone: Not on file    Gets together: Not on file    Attends religious service: Not on file    Active member of club or organization: Not on file    Attends meetings of clubs or organizations: Not on file    Relationship status: Not on file  . Intimate partner violence:    Fear of current or ex partner: Not on file  Emotionally abused: Not on file    Physically abused: Not on file    Forced sexual activity: Not on file  Other Topics Concern  . Not on file  Social History Narrative  . Not on file     Allergies  Allergen Reactions  . Corticosteroids Other (See Comments)    Suicidal  . Compazine [Prochlorperazine] Other (See Comments)    Acute dystonic reaction type  . Eggs Or Egg-Derived Products   . Nickel Rash    skin  . Penicillins Rash     Outpatient Medications Prior to Visit  Medication Sig Dispense Refill  . AMBULATORY NON FORMULARY MEDICATION Blood pressure monitor use daily for hypertension. 1 Device 0  . Cholecalciferol (VITAMIN D3) 5000 units CAPS Take 5,000 Units by mouth daily.    . diclofenac sodium (VOLTAREN) 1 % GEL Apply 2 g 4 (four) times daily topically. To affected joint. 100 g 11  . DIGOX 125 MCG tablet TAKE 1 TABLET BY MOUTH EVERY DAY 90 tablet 1  . fluticasone (FLONASE) 50 MCG/ACT nasal spray SPRAY 1 SPRAY IN EACH NOSTRILS BID  0  .  fluticasone-salmeterol (ADVAIR HFA) 230-21 MCG/ACT inhaler Inhale 2 puffs into the lungs 2 (two) times daily. 1 Inhaler 12  . ipratropium (ATROVENT) 0.06 % nasal spray Place 2 sprays into both nostrils every 4 (four) hours as needed for rhinitis. 10 mL 6  . Ipratropium-Albuterol (COMBIVENT) 20-100 MCG/ACT AERS respimat Inhale 1 puff into the lungs every 6 (six) hours as needed for wheezing or shortness of breath. 1 Inhaler 5  . ipratropium-albuterol (DUONEB) 0.5-2.5 (3) MG/3ML SOLN Use 1 every 6 hours as needed for wheezing 360 mL 1  . lisinopril (PRINIVIL,ZESTRIL) 10 MG tablet TAKE 1 TABLET(10 MG) BY MOUTH DAILY 30 tablet 5  . metoprolol succinate (TOPROL-XL) 100 MG 24 hr tablet TAKE 1 TABLET BY MOUTH DAILY. TAKE WITH OR IMMEDIATELY FOLLOWING A MEAL. 90 tablet 1  . omeprazole (PRILOSEC) 40 MG capsule Take 1 capsule (40 mg total) by mouth daily. 90 capsule 3  . ondansetron (ZOFRAN) 4 MG tablet Take 1 tablet (4 mg total) by mouth every 8 (eight) hours as needed for nausea or vomiting. 20 tablet 0  . tiotropium (SPIRIVA) 18 MCG inhalation capsule Place 1 capsule (18 mcg total) into inhaler and inhale daily. 30 capsule 3  . triamterene-hydrochlorothiazide (MAXZIDE-25) 37.5-25 MG tablet TAKE 1 TABLET BY MOUTH DAILY 30 tablet 0   No facility-administered medications prior to visit.     Review of Systems  Constitutional: Negative for chills, fever, malaise/fatigue and weight loss.  HENT: Negative for congestion, nosebleeds, sinus pain and sore throat.   Eyes: Negative for photophobia, pain and discharge.  Respiratory: Positive for cough, shortness of breath and wheezing. Negative for hemoptysis and sputum production.   Cardiovascular: Negative for chest pain, palpitations, orthopnea and leg swelling.  Gastrointestinal: Negative for abdominal pain, constipation, diarrhea, nausea and vomiting.  Genitourinary: Negative for dysuria, frequency, hematuria and urgency.  Musculoskeletal: Negative for back  pain, joint pain, myalgias and neck pain.  Skin: Negative for itching and rash.  Neurological: Negative for tingling, tremors, sensory change, speech change, focal weakness, seizures, weakness and headaches.  Psychiatric/Behavioral: Negative for memory loss, substance abuse and suicidal ideas. The patient is not nervous/anxious.       Objective:  Physical Exam   Vitals:   01/29/18 1418  BP: (!) 146/67  Pulse: 62  SpO2: 98%  Weight: 166 lb (75.3 kg)  Height: 5\' 4"  (1.626 m)  RA  Gen: well appearing, no acute distress HENT: NCAT, OP clear, neck supple without masses Eyes: PERRL, EOMi Lymph: no cervical lymphadenopathy PULM: CTA B CV: RRR, no mgr, no JVD GI: BS+, soft, nontender, no hsm Derm: no rash or skin breakdown MSK: normal bulk and tone Neuro: A&Ox4, CN II-XII intact, strength 5/5 in all 4 extremities Psyche: normal mood and affect   CBC    Component Value Date/Time   WBC 10.9 (H) 09/30/2017 0000   RBC 5.23 (H) 09/30/2017 0000   HGB 15.7 (H) 09/30/2017 0000   HCT 45.4 (H) 09/30/2017 0000   PLT 403 (H) 09/30/2017 0000   MCV 86.8 09/30/2017 0000   MCH 30.0 09/30/2017 0000   MCHC 34.6 09/30/2017 0000   RDW 13.1 09/30/2017 0000     Chest imaging: May 2019 chest x-ray images independently reviewed showing hyperinflation, emphysema changes, increased AP diameter  PFT:  Labs:  Path:  Echo:  Heart Catheterization:       Assessment & Plan:   Shortness of breath - Plan: Pulmonary function test  Simple chronic bronchitis ([place])  Discussion: Anda Kraft carries a diagnosis of COPD but it is unclear to me if she actually has this problem or not.  She does appear to have emphysematous changes on her chest x-ray.  She describes several months of cough congestion fevers chills nausea and vomiting after an aspiration event.  While its possible that the cough is related to her lisinopril, she says that she has now had resolution of the cough.  Because of this I  am inclined to think that the aspiration event she had in February 2019 led to a nasty case of pneumonia that just took several weeks to get better.  Plan: Shortness of breath with severe case of bronchitis: I am going to evaluate for COPD with a breathing test called a full pulmonary function test In the meantime I like to continue treating you as if you have COPD until we rule that out so I like for you to take Advair twice a day and Spiriva daily no matter what If you are not feeling better on the next visit or if you feel worse then we will stop the lisinopril and change to another agent.  We may also consider performing a CT scan of your chest to evaluate for other lung problems.  Follow-up in 1 week with our nurse practitioner Tammy.    Current Outpatient Medications:  .  AMBULATORY NON FORMULARY MEDICATION, Blood pressure monitor use daily for hypertension., Disp: 1 Device, Rfl: 0 .  Cholecalciferol (VITAMIN D3) 5000 units CAPS, Take 5,000 Units by mouth daily., Disp: , Rfl:  .  diclofenac sodium (VOLTAREN) 1 % GEL, Apply 2 g 4 (four) times daily topically. To affected joint., Disp: 100 g, Rfl: 11 .  DIGOX 125 MCG tablet, TAKE 1 TABLET BY MOUTH EVERY DAY, Disp: 90 tablet, Rfl: 1 .  fluticasone (FLONASE) 50 MCG/ACT nasal spray, SPRAY 1 SPRAY IN EACH NOSTRILS BID, Disp: , Rfl: 0 .  fluticasone-salmeterol (ADVAIR HFA) 230-21 MCG/ACT inhaler, Inhale 2 puffs into the lungs 2 (two) times daily., Disp: 1 Inhaler, Rfl: 12 .  ipratropium (ATROVENT) 0.06 % nasal spray, Place 2 sprays into both nostrils every 4 (four) hours as needed for rhinitis., Disp: 10 mL, Rfl: 6 .  Ipratropium-Albuterol (COMBIVENT) 20-100 MCG/ACT AERS respimat, Inhale 1 puff into the lungs every 6 (six) hours as needed for wheezing or shortness of breath., Disp: 1 Inhaler, Rfl: 5 .  ipratropium-albuterol (DUONEB) 0.5-2.5 (3) MG/3ML SOLN, Use 1 every 6 hours as needed for wheezing, Disp: 360 mL, Rfl: 1 .  lisinopril  (PRINIVIL,ZESTRIL) 10 MG tablet, TAKE 1 TABLET(10 MG) BY MOUTH DAILY, Disp: 30 tablet, Rfl: 5 .  metoprolol succinate (TOPROL-XL) 100 MG 24 hr tablet, TAKE 1 TABLET BY MOUTH DAILY. TAKE WITH OR IMMEDIATELY FOLLOWING A MEAL., Disp: 90 tablet, Rfl: 1 .  omeprazole (PRILOSEC) 40 MG capsule, Take 1 capsule (40 mg total) by mouth daily., Disp: 90 capsule, Rfl: 3 .  ondansetron (ZOFRAN) 4 MG tablet, Take 1 tablet (4 mg total) by mouth every 8 (eight) hours as needed for nausea or vomiting., Disp: 20 tablet, Rfl: 0 .  tiotropium (SPIRIVA) 18 MCG inhalation capsule, Place 1 capsule (18 mcg total) into inhaler and inhale daily., Disp: 30 capsule, Rfl: 3 .  triamterene-hydrochlorothiazide (MAXZIDE-25) 37.5-25 MG tablet, TAKE 1 TABLET BY MOUTH DAILY, Disp: 30 tablet, Rfl: 0

## 2018-02-05 ENCOUNTER — Other Ambulatory Visit (INDEPENDENT_AMBULATORY_CARE_PROVIDER_SITE_OTHER): Payer: Medicare Other

## 2018-02-05 ENCOUNTER — Ambulatory Visit (INDEPENDENT_AMBULATORY_CARE_PROVIDER_SITE_OTHER): Payer: Medicare Other | Admitting: Pulmonary Disease

## 2018-02-05 ENCOUNTER — Ambulatory Visit (INDEPENDENT_AMBULATORY_CARE_PROVIDER_SITE_OTHER): Payer: Medicare Other | Admitting: Primary Care

## 2018-02-05 ENCOUNTER — Encounter: Payer: Self-pay | Admitting: Primary Care

## 2018-02-05 ENCOUNTER — Ambulatory Visit (INDEPENDENT_AMBULATORY_CARE_PROVIDER_SITE_OTHER): Payer: Medicare Other

## 2018-02-05 DIAGNOSIS — G8929 Other chronic pain: Secondary | ICD-10-CM | POA: Diagnosis not present

## 2018-02-05 DIAGNOSIS — J449 Chronic obstructive pulmonary disease, unspecified: Secondary | ICD-10-CM

## 2018-02-05 DIAGNOSIS — R059 Cough, unspecified: Secondary | ICD-10-CM

## 2018-02-05 DIAGNOSIS — R05 Cough: Secondary | ICD-10-CM

## 2018-02-05 DIAGNOSIS — R0602 Shortness of breath: Secondary | ICD-10-CM

## 2018-02-05 DIAGNOSIS — I10 Essential (primary) hypertension: Secondary | ICD-10-CM | POA: Diagnosis not present

## 2018-02-05 DIAGNOSIS — I480 Paroxysmal atrial fibrillation: Secondary | ICD-10-CM

## 2018-02-05 DIAGNOSIS — R918 Other nonspecific abnormal finding of lung field: Secondary | ICD-10-CM | POA: Diagnosis not present

## 2018-02-05 DIAGNOSIS — I2510 Atherosclerotic heart disease of native coronary artery without angina pectoris: Secondary | ICD-10-CM | POA: Diagnosis not present

## 2018-02-05 DIAGNOSIS — I700 Atherosclerosis of aorta: Secondary | ICD-10-CM | POA: Diagnosis not present

## 2018-02-05 DIAGNOSIS — M25512 Pain in left shoulder: Secondary | ICD-10-CM

## 2018-02-05 LAB — BASIC METABOLIC PANEL
BUN: 16 mg/dL (ref 6–23)
CHLORIDE: 99 meq/L (ref 96–112)
CO2: 32 mEq/L (ref 19–32)
Calcium: 9.6 mg/dL (ref 8.4–10.5)
Creatinine, Ser: 0.87 mg/dL (ref 0.40–1.20)
GFR: 67.59 mL/min (ref >60.00)
Glucose, Bld: 102 mg/dL — ABNORMAL HIGH (ref 70–99)
POTASSIUM: 3.9 meq/L (ref 3.5–5.1)
Sodium: 137 mEq/L (ref 135–145)

## 2018-02-05 LAB — CBC WITH DIFFERENTIAL/PLATELET
Basophils Absolute: 0 10*3/uL (ref 0.0–0.1)
Basophils Relative: 0.5 % (ref 0.0–3.0)
EOS PCT: 1.9 % (ref 0.0–5.0)
Eosinophils Absolute: 0.1 10*3/uL (ref 0.0–0.7)
HEMATOCRIT: 38.9 % (ref 36.0–46.0)
HEMOGLOBIN: 13.3 g/dL (ref 12.0–15.0)
LYMPHS PCT: 31.8 % (ref 12.0–46.0)
Lymphs Abs: 2.4 10*3/uL (ref 0.7–4.0)
MCHC: 34.1 g/dL (ref 30.0–36.0)
MCV: 88.3 fl (ref 78.0–100.0)
Monocytes Absolute: 0.4 10*3/uL (ref 0.1–1.0)
Monocytes Relative: 5.3 % (ref 3.0–12.0)
Neutro Abs: 4.5 10*3/uL (ref 1.4–7.7)
Neutrophils Relative %: 60.5 % (ref 43.0–77.0)
Platelets: 333 10*3/uL (ref 150.0–400.0)
RBC: 4.41 Mil/uL (ref 3.87–5.11)
RDW: 14.3 % (ref 11.5–15.5)
WBC: 7.5 10*3/uL (ref 4.0–10.5)

## 2018-02-05 LAB — PULMONARY FUNCTION TEST
DL/VA % PRED: 97 %
DL/VA: 4.59 ml/min/mmHg/L
DLCO UNC % PRED: 64 %
DLCO UNC: 14.87 ml/min/mmHg
FEF 25-75 POST: 2.71 L/s
FEF 25-75 PRE: 1.76 L/s
FEF2575-%Change-Post: 54 %
FEF2575-%PRED-POST: 165 %
FEF2575-%PRED-PRE: 107 %
FEV1-%CHANGE-POST: 7 %
FEV1-%Pred-Post: 90 %
FEV1-%Pred-Pre: 84 %
FEV1-Post: 1.85 L
FEV1-Pre: 1.73 L
FEV1FVC-%CHANGE-POST: 6 %
FEV1FVC-%Pred-Pre: 109 %
FEV6-%Change-Post: 0 %
FEV6-%Pred-Post: 81 %
FEV6-%Pred-Pre: 81 %
FEV6-Post: 2.11 L
FEV6-Pre: 2.1 L
FEV6FVC-%Pred-Post: 105 %
FEV6FVC-%Pred-Pre: 105 %
FVC-%Change-Post: 0 %
FVC-%PRED-PRE: 77 %
FVC-%Pred-Post: 77 %
FVC-PRE: 2.1 L
FVC-Post: 2.11 L
POST FEV1/FVC RATIO: 87 %
PRE FEV6/FVC RATIO: 100 %
Post FEV6/FVC ratio: 100 %
Pre FEV1/FVC ratio: 82 %
RV % pred: 83 %
RV: 1.86 L
TLC % PRED: 83 %
TLC: 4.11 L

## 2018-02-05 LAB — BRAIN NATRIURETIC PEPTIDE: Pro B Natriuretic peptide (BNP): 84 pg/mL (ref 0.0–100.0)

## 2018-02-05 MED ORDER — LOSARTAN POTASSIUM 100 MG PO TABS: 50.0000 mg | ORAL_TABLET | Freq: Every day | ORAL | 1 refills | Status: DC

## 2018-02-05 NOTE — Assessment & Plan Note (Addendum)
PFTs (02/05/18) FVC 2.10 (77%), FEV1 1.73 (84), Ratio 82 (109), DLCO 14.87 (64%).

## 2018-02-05 NOTE — Patient Instructions (Addendum)
Stop Lisinopril >>> changing BP to Losartan 50mg  by mouth daily Follow up with PCP for management of BP  Setting up High resolution chest CT Labs today   FU in 1 month with Dr. Lake Bells

## 2018-02-05 NOTE — Assessment & Plan Note (Addendum)
-  Pulmonary function test today showing no evidence of obstruction, no bronchodilator response, decreased DLCO  -CBC normal showing no anemia, BMET with normal kidney function, BNP negative  -Ordering HRCT, awaiting d-dimer   -Up in 1 month with Dr. Lake Bells

## 2018-02-05 NOTE — Assessment & Plan Note (Addendum)
-  Continues to have cough with little production -Trial discontinue ACEi, changed to ARB

## 2018-02-05 NOTE — Assessment & Plan Note (Signed)
Regular rhythm in office today Continue digoxin and beta-blocker

## 2018-02-05 NOTE — Progress Notes (Signed)
PFT done today. 

## 2018-02-05 NOTE — Progress Notes (Signed)
@Patient  ID: Kimberly Jackson, female    DOB: 09-Dec-1945, 72 y.o.   MRN: [id number]  Chief Complaint  Patient presents with  . Follow-up    PFT today. States she has been having increased SOB. Reports a soreness to her left side states she thinks he may have pulled a muscle.     Referring provider: Gregor Hams, MD  HPI: 72-year-old female, never smoked.  History of hypertension, proximal A. Fib, asthma/COPD, OSA, reflux.  Dr. Lake Bells, last seen on 7/11 for shortness of breath with exertion, productive cough with yellow sputum, wheezing and some chest tightness since March.  Stated flulike symptoms.  History of asthma as a child, both her parents smoked but no COPD. Dx shortness of breath and severe bronchitis.  Ordered for pulmonary function test to evaluate COPD.  Instructed to continue Advair twice daily and Spiriva daily.   02/05/2018 Presents today for follow-up appointment to review PFTs.  Notes that her breathing has been good up until last week. Reports slightly productive cough, patient clears her throat multiple times during visit.  Experiences mild shortness of breath with activity.  Complains of soreness to left upper chest and back. Denies chest pain. This is relieved with nebulizer and Tylenol. She continues Advair twice daily and Spiriva daily.  Rarely requiring Combivent inhaler or nebulizer.    PFTs (02/05/18)- FVC 2.10 (77%), FEV1 1.73 (84), Ratio 82 (109), DLCO 14.87 (64%).  No obstruction, no positive bronchodilator response, decreased DLCO.   Allergies  Allergen Reactions  . Corticosteroids Other (See Comments)    Suicidal  . Compazine [Prochlorperazine] Other (See Comments)    Acute dystonic reaction type  . Eggs Or Egg-Derived Products   . Nickel Rash    skin  . Penicillins Rash    Immunization History  Administered Date(s) Administered  . Influenza, High Dose Seasonal PF 06/09/2017  . Influenza,inj,Quad PF,6+ Mos 09/03/2016  . Pneumococcal Conjugate-13  01/04/2016  . Pneumococcal Polysaccharide-23 04/21/2013  . Tdap 11/20/2014  . Zoster 01/04/2016    Past Medical History:  Diagnosis Date  . Alcohol abuse   . COPD (chronic obstructive pulmonary disease) ([place])   . Depression   . Reflux     Tobacco History: Social History   Tobacco Use  Smoking Status Never Smoker  Smokeless Tobacco Never Used   Counseling given: Not Answered   Outpatient Medications Prior to Visit  Medication Sig Dispense Refill  . AMBULATORY NON FORMULARY MEDICATION Blood pressure monitor use daily for hypertension. 1 Device 0  . Cholecalciferol (VITAMIN D3) 5000 units CAPS Take 5,000 Units by mouth daily.    . diclofenac sodium (VOLTAREN) 1 % GEL Apply 2 g 4 (four) times daily topically. To affected joint. 100 g 11  . DIGOX 125 MCG tablet TAKE 1 TABLET BY MOUTH EVERY DAY 90 tablet 1  . fluticasone (FLONASE) 50 MCG/ACT nasal spray SPRAY 1 SPRAY IN EACH NOSTRILS BID  0  . fluticasone-salmeterol (ADVAIR HFA) 230-21 MCG/ACT inhaler Inhale 2 puffs into the lungs 2 (two) times daily. 1 Inhaler 12  . ipratropium (ATROVENT) 0.06 % nasal spray Place 2 sprays into both nostrils every 4 (four) hours as needed for rhinitis. 10 mL 6  . Ipratropium-Albuterol (COMBIVENT) 20-100 MCG/ACT AERS respimat Inhale 1 puff into the lungs every 6 (six) hours as needed for wheezing or shortness of breath. 1 Inhaler 5  . ipratropium-albuterol (DUONEB) 0.5-2.5 (3) MG/3ML SOLN Use 1 every 6 hours as needed for wheezing 360 mL 1  .  metoprolol succinate (TOPROL-XL) 100 MG 24 hr tablet TAKE 1 TABLET BY MOUTH DAILY. TAKE WITH OR IMMEDIATELY FOLLOWING A MEAL. 90 tablet 1  . omeprazole (PRILOSEC) 40 MG capsule Take 1 capsule (40 mg total) by mouth daily. 90 capsule 3  . ondansetron (ZOFRAN) 4 MG tablet Take 1 tablet (4 mg total) by mouth every 8 (eight) hours as needed for nausea or vomiting. 20 tablet 0  . tiotropium (SPIRIVA) 18 MCG inhalation capsule Place 1 capsule (18 mcg total) into inhaler  and inhale daily. 30 capsule 3  . triamterene-hydrochlorothiazide (MAXZIDE-25) 37.5-25 MG tablet TAKE 1 TABLET BY MOUTH DAILY 30 tablet 0  . lisinopril (PRINIVIL,ZESTRIL) 10 MG tablet TAKE 1 TABLET(10 MG) BY MOUTH DAILY 30 tablet 5   No facility-administered medications prior to visit.       Review of Systems  Review of Systems  Constitutional: Negative.   HENT: Positive for postnasal drip.   Respiratory: Positive for cough and shortness of breath.   Cardiovascular: Negative.   Allergic/Immunologic: Negative.   Neurological: Negative.   Psychiatric/Behavioral: Negative.      Physical Exam  BP 128/84   Pulse 70   Ht 5\' 3"  (1.6 m)   Wt 169 lb (76.7 kg)   SpO2 98%   BMI 29.94 kg/m  Physical Exam  Constitutional: She is oriented to person, place, and time. She appears well-developed and well-nourished.  HENT:  Head: Normocephalic and atraumatic.  Nose: Nose normal.  Mouth/Throat: Oropharynx is clear and moist.  Eyes: Pupils are equal, round, and reactive to light. EOM are normal.  Neck: Normal range of motion. Neck supple.  Cardiovascular: Normal rate and regular rhythm.  Tace LE edema  Pulmonary/Chest: Breath sounds normal. Accessory muscle usage present. No tachypnea. No respiratory distress. She has no decreased breath sounds.  LSC T/O, no wheeze or sob. Clears throat multiple times during visit.  Neurological: She is alert and oriented to person, place, and time.  Skin: Skin is warm and dry.  Psychiatric: She has a normal mood and affect. Her behavior is normal. Judgment and thought content normal.     Lab Results:  CBC    Component Value Date/Time   WBC 7.5 02/05/2018 1144   RBC 4.41 02/05/2018 1144   HGB 13.3 02/05/2018 1144   HCT 38.9 02/05/2018 1144   PLT 333.0 02/05/2018 1144   MCV 88.3 02/05/2018 1144   MCH 30.0 09/30/2017 0000   MCHC 34.1 02/05/2018 1144   RDW 14.3 02/05/2018 1144   LYMPHSABS 2.4 02/05/2018 1144   MONOABS 0.4 02/05/2018 1144    EOSABS 0.1 02/05/2018 1144   BASOSABS 0.0 02/05/2018 1144    BMET    Component Value Date/Time   NA 137 02/05/2018 1144   K 3.9 02/05/2018 1144   CL 99 02/05/2018 1144   CO2 32 02/05/2018 1144   GLUCOSE 102 (H) 02/05/2018 1144   BUN 16 02/05/2018 1144   CREATININE 0.87 02/05/2018 1144   CREATININE 0.85 10/13/2017 0824   CALCIUM 9.6 02/05/2018 1144   GFRNONAA 86 10/06/2017 0906   GFRAA 100 10/06/2017 0906    BNP No results found for: BNP  ProBNP    Component Value Date/Time   PROBNP 84.0 02/05/2018 1144    Imaging: No results found.   Assessment & Plan:   Cough -Continues to have cough with little production -Trial discontinue ACEi, changed to ARB   Exertional shortness of breath -Pulmonary function test today showing no evidence of obstruction, no bronchodilator response, decreased DLCO  -  CBC normal showing no anemia, BMET with normal kidney function, BNP negative  -Ordering HRCT, awaiting d-dimer   -Up in 1 month with Dr. Lake Bells    Left shoulder pain Complains of left side soreness. Patient has full range of motion.  Tylenol helps. Checking d-dimer to rule out possibility of PE.    HTN (hypertension) -Stable, BP 128/84 -Discontinue ACEi due to chronic cough, changing to losartan 50 mg daily -Instructed patient to follow-up with PCP for ongoing management  Paroxysmal a-fib ([facility]) Regular rhythm in office today Continue digoxin and beta-blocker     Martyn Ehrich, NP 02/05/2018

## 2018-02-05 NOTE — Assessment & Plan Note (Signed)
Complains of left side soreness. Patient has full range of motion.  Tylenol helps. Checking d-dimer to rule out possibility of PE.

## 2018-02-05 NOTE — Assessment & Plan Note (Signed)
-  Stable, BP 128/84 -Discontinue ACEi due to chronic cough, changing to losartan 50 mg daily -Instructed patient to follow-up with PCP for ongoing management

## 2018-02-06 LAB — D-DIMER, QUANTITATIVE: D-Dimer, Quant: 0.99 mcg/mL FEU — ABNORMAL HIGH (ref <0.50)

## 2018-02-06 NOTE — Progress Notes (Signed)
Reviewed, agree 

## 2018-02-10 ENCOUNTER — Telehealth: Payer: Self-pay | Admitting: Primary Care

## 2018-02-10 DIAGNOSIS — J439 Emphysema, unspecified: Secondary | ICD-10-CM

## 2018-02-10 DIAGNOSIS — R7989 Other specified abnormal findings of blood chemistry: Secondary | ICD-10-CM

## 2018-02-10 NOTE — Telephone Encounter (Signed)
atc pt X2, line rang to fast busy signal.  Wcb.  

## 2018-02-10 NOTE — Telephone Encounter (Signed)
lmtcb x1 for pt. 

## 2018-02-10 NOTE — Telephone Encounter (Signed)
Pt is returning call. Cb is [phone].

## 2018-02-10 NOTE — Telephone Encounter (Signed)
Please call patient and see how she is doing? HRCT showing small airway disease (COPD), scattered tiny solid pulmonary nodules (low risk because she has never smoked), postinflammatory scarring versus mild/early interstitial lung disease- recommend follow up HRCT in 12 months. She can discuss this further with Dr. Lake Bells.   D-dimer was mildly elevated. Please order CTA re: r/o PE.

## 2018-02-11 NOTE — Telephone Encounter (Signed)
Patient returned call, CB is [phone]

## 2018-02-11 NOTE — Telephone Encounter (Signed)
Pt states her symptoms have improved, however she is still having discomfort on left side of chest into shoulder.   Pt is aware of results and voiced her understanding. CTA has been ordered.  CTHR has been ordered for 12 months.  Nothing further is needed.

## 2018-02-12 ENCOUNTER — Ambulatory Visit (HOSPITAL_BASED_OUTPATIENT_CLINIC_OR_DEPARTMENT_OTHER)
Admission: RE | Admit: 2018-02-12 | Discharge: 2018-02-12 | Disposition: A | Discharge status: Home / Self Care | Payer: Medicare Other | Source: Ambulatory Visit | Attending: Primary Care | Admitting: Primary Care

## 2018-02-12 ENCOUNTER — Encounter (HOSPITAL_BASED_OUTPATIENT_CLINIC_OR_DEPARTMENT_OTHER): Payer: Self-pay

## 2018-02-12 DIAGNOSIS — R0789 Other chest pain: Secondary | ICD-10-CM | POA: Diagnosis not present

## 2018-02-12 DIAGNOSIS — J9811 Atelectasis: Secondary | ICD-10-CM | POA: Insufficient documentation

## 2018-02-12 DIAGNOSIS — R7989 Other specified abnormal findings of blood chemistry: Secondary | ICD-10-CM | POA: Diagnosis not present

## 2018-02-12 MED ORDER — IOPAMIDOL (ISOVUE-370) INJECTION 76%
100.0000 mL | Freq: Once | INTRAVENOUS | Status: AC | PRN
  Administered 2018-02-12: 100 mL via INTRAVENOUS

## 2018-02-14 ENCOUNTER — Other Ambulatory Visit: Payer: Self-pay | Admitting: Family Medicine

## 2018-02-24 ENCOUNTER — Other Ambulatory Visit: Payer: Self-pay | Admitting: Family Medicine

## 2018-03-13 ENCOUNTER — Ambulatory Visit (INDEPENDENT_AMBULATORY_CARE_PROVIDER_SITE_OTHER): Payer: Medicare Other | Admitting: Pulmonary Disease

## 2018-03-13 ENCOUNTER — Encounter: Payer: Self-pay | Admitting: Pulmonary Disease

## 2018-03-13 VITALS — BP 118/80 | HR 64 | Ht 62.6 in | Wt 174.0 lb

## 2018-03-13 DIAGNOSIS — R911 Solitary pulmonary nodule: Secondary | ICD-10-CM

## 2018-03-13 DIAGNOSIS — R918 Other nonspecific abnormal finding of lung field: Secondary | ICD-10-CM | POA: Diagnosis not present

## 2018-03-13 DIAGNOSIS — R0602 Shortness of breath: Secondary | ICD-10-CM | POA: Diagnosis not present

## 2018-03-13 DIAGNOSIS — J479 Bronchiectasis, uncomplicated: Secondary | ICD-10-CM | POA: Diagnosis not present

## 2018-03-13 NOTE — Addendum Note (Signed)
Addended by: Dolores Lory on: 03/13/2018 11:02 AM   Modules accepted: Orders

## 2018-03-13 NOTE — Patient Instructions (Signed)
Shortness of breath: I think it is a good idea to try to exercise more frequently  To help lose weight: The following behaviors have been associated with weight loss: Weigh yourself daily Write down everything you eat Drink a glass of water prior to eating a meal Only eat when you are hungry Buy food from the periphery of the grocery store, not the middle  Bronchiectasis: This term means that there is enlargement of the airways in the bases of your lungs.  This increases the likelihood of bronchitis when you get a cold.  So if you ever get a cough with mucus production you need to be treated with antibiotics sooner rather than later.  You should come to our clinic to be seen for this. Get a flu shot Continue Advair for now  Pulmonary nodule: You have a small, 3 mm nodule which was seen.  At this time we have no reason to believe that this is malignant but we should follow-up with a repeat CT scan in 1 year.  This is been arranged for.  I will plan on seeing you back in 1 year or sooner if needed

## 2018-03-13 NOTE — Progress Notes (Signed)
Synopsis: Referred in July 2019 for management of cough, dysnpea.  Cough resolved with stopping lisinopril.  Found to have a hiatal hernia and mild bronchiectasis changes probably due to chronic silent aspiration.  Has a history of Zenker's diverticulum.  Subjective:   PATIENT ID: Kimberly Jackson GENDER: female DOB: 05/25/1946, MRN: [id number]   HPI  Chief Complaint  Patient presents with  . Follow-up    Kimberly Jackson has been doing well since last visit.  She says that her cough is nearly completely resolved.  She still has an intermittent dry cough from time to time but it is not too frequent.  She says that she has not been sick with bronchitis or pneumonia.  She says that she has a history of Zenker's diverticulum which has been corrected in the past but she feels that it is becoming a problem again for her.  She also has been told that she has a hiatal hernia.  She still says that she has some shortness of breath when she exerts herself.   Past Medical History:  Diagnosis Date  . Alcohol abuse   . COPD (chronic obstructive pulmonary disease) ([place])   . Depression   . Reflux      Family History  Problem Relation Age of Onset  . Alcohol abuse Mother       Review of Systems  Constitutional: Negative for chills, fever, malaise/fatigue and weight loss.  HENT: Negative for congestion, nosebleeds, sinus pain and sore throat.   Eyes: Negative for photophobia, pain and discharge.  Respiratory: Positive for cough, shortness of breath and wheezing. Negative for hemoptysis and sputum production.   Cardiovascular: Negative for chest pain, palpitations, orthopnea and leg swelling.  Gastrointestinal: Negative for abdominal pain, constipation, diarrhea, nausea and vomiting.  Genitourinary: Negative for dysuria, frequency, hematuria and urgency.  Musculoskeletal: Negative for back pain, joint pain, myalgias and neck pain.  Skin: Negative for itching and rash.  Neurological: Negative for tingling,  tremors, sensory change, speech change, focal weakness, seizures, weakness and headaches.  Psychiatric/Behavioral: Negative for memory loss, substance abuse and suicidal ideas. The patient is not nervous/anxious.       Objective:  Physical Exam   Vitals:   03/13/18 1020  BP: 118/80  Pulse: 64  SpO2: 99%  Weight: 174 lb (78.9 kg)  Height: 5' 2.6" (1.59 m)    RA  Gen: well appearing HENT: OP clear, TM's clear, neck supple PULM: CTA B, normal percussion CV: RRR, no mgr, trace edema GI: BS+, soft, nontender Derm: no cyanosis or rash Psyche: normal mood and affect    CBC    Component Value Date/Time   WBC 7.5 02/05/2018 1144   RBC 4.41 02/05/2018 1144   HGB 13.3 02/05/2018 1144   HCT 38.9 02/05/2018 1144   PLT 333.0 02/05/2018 1144   MCV 88.3 02/05/2018 1144   MCH 30.0 09/30/2017 0000   MCHC 34.1 02/05/2018 1144   RDW 14.3 02/05/2018 1144   LYMPHSABS 2.4 02/05/2018 1144   MONOABS 0.4 02/05/2018 1144   EOSABS 0.1 02/05/2018 1144   BASOSABS 0.0 02/05/2018 1144     Chest imaging: May 2019 chest x-ray images independently reviewed showing hyperinflation, emphysema changes, increased AP diameter 01/2018 HRCT> bronchiectasis changes bases, hiatal hernia images independently reviewed; radiology feels there is increased reticular/fibrotic change of undetermined significance, 6mm nodule 01/2018 CT angiogram> no PE  PFT: 01/2018 PFT> Ratio 87%, FEV 1.85L (90% pred), FVC 2.11 L (77% pred), TLC 4.11L (83% pred); DLCO 14.87 mL (64%  pred)  Labs:  Path:  Echo:  Heart Catheterization:       Assessment & Plan:   Shortness of breath  Bronchiectasis without complication (HCC)  Pulmonary nodule  Discussion: Kimberly Jackson has chronic silent aspiration from her hiatal hernia and Zenker's diverticulum which is led to mild bronchiectasis changes in the bases of her lungs.  Radiology was concerned about a fibrotic lung disease but I do not see clear evidence of that.  Her cough  has improved since stopping lisinopril.  The abnormalities on lung function testing are minor, only a mild decrease in diffusion capacity which is related to the changes from her bronchiectasis in the bases.  I think she is deconditioned and overweight and needs to exercise more to help with the shortness of breath.  Plan: Shortness of breath: I think it is a good idea to try to exercise more frequently  To help lose weight: The following behaviors have been associated with weight loss: Weigh yourself daily Write down everything you eat Drink a glass of water prior to eating a meal Only eat when you are hungry Buy food from the periphery of the grocery store, not the middle  Bronchiectasis: This term means that there is enlargement of the airways in the bases of your lungs.  This increases the likelihood of bronchitis when you get a cold.  So if you ever get a cough with mucus production you need to be treated with antibiotics sooner rather than later.  You should come to our clinic to be seen for this. Get a flu shot Continue Advair for now  Pulmonary nodule: You have a small, 3 mm nodule which was seen.  At this time we have no reason to believe that this is malignant but we should follow-up with a repeat CT scan in 1 year.  This is been arranged for.  I will plan on seeing you back in 1 year or sooner if needed   Current Outpatient Medications:  .  AMBULATORY NON FORMULARY MEDICATION, Blood pressure monitor use daily for hypertension., Disp: 1 Device, Rfl: 0 .  Cholecalciferol (VITAMIN D3) 5000 units CAPS, Take 5,000 Units by mouth daily., Disp: , Rfl:  .  diclofenac sodium (VOLTAREN) 1 % GEL, Apply 2 g 4 (four) times daily topically. To affected joint., Disp: 100 g, Rfl: 11 .  DIGOX 125 MCG tablet, TAKE 1 TABLET BY MOUTH EVERY DAY, Disp: 90 tablet, Rfl: 1 .  fluticasone (FLONASE) 50 MCG/ACT nasal spray, SPRAY 1 SPRAY IN EACH NOSTRILS BID, Disp: , Rfl: 0 .   fluticasone-salmeterol (ADVAIR HFA) 230-21 MCG/ACT inhaler, Inhale 2 puffs into the lungs 2 (two) times daily., Disp: 1 Inhaler, Rfl: 12 .  ipratropium (ATROVENT) 0.06 % nasal spray, Place 2 sprays into both nostrils every 4 (four) hours as needed for rhinitis., Disp: 10 mL, Rfl: 6 .  Ipratropium-Albuterol (COMBIVENT) 20-100 MCG/ACT AERS respimat, Inhale 1 puff into the lungs every 6 (six) hours as needed for wheezing or shortness of breath., Disp: 1 Inhaler, Rfl: 5 .  ipratropium-albuterol (DUONEB) 0.5-2.5 (3) MG/3ML SOLN, Use 1 every 6 hours as needed for wheezing, Disp: 360 mL, Rfl: 1 .  losartan (COZAAR) 100 MG tablet, Take 0.5 tablets (50 mg total) by mouth daily., Disp: 30 tablet, Rfl: 1 .  metoprolol succinate (TOPROL-XL) 100 MG 24 hr tablet, TAKE 1 TABLET BY MOUTH DAILY. TAKE WITH OR IMMEDIATELY FOLLOWING A MEAL., Disp: 90 tablet, Rfl: 0 .  omeprazole (PRILOSEC) 40 MG capsule, TAKE 1 CAPSULE(40  MG) BY MOUTH DAILY, Disp: 90 capsule, Rfl: 3 .  ondansetron (ZOFRAN) 4 MG tablet, Take 1 tablet (4 mg total) by mouth every 8 (eight) hours as needed for nausea or vomiting., Disp: 20 tablet, Rfl: 0 .  tiotropium (SPIRIVA) 18 MCG inhalation capsule, Place 1 capsule (18 mcg total) into inhaler and inhale daily., Disp: 30 capsule, Rfl: 3 .  triamterene-hydrochlorothiazide (MAXZIDE-25) 37.5-25 MG tablet, TAKE 1 TABLET BY MOUTH DAILY, Disp: 30 tablet, Rfl: 0

## 2018-03-17 ENCOUNTER — Telehealth: Payer: Self-pay | Admitting: Family Medicine

## 2018-03-17 NOTE — Telephone Encounter (Signed)
email sent to see when this will be available, then will contact Pt.

## 2018-03-17 NOTE — Telephone Encounter (Signed)
-----   Message from Gregor Hams, MD sent at 03/05/2018  8:02 AM EDT ----- Regarding: egg free flu shot Please ask [name] about the egg free flu shot.   Thanks, Ellard Artis

## 2018-03-19 NOTE — Telephone Encounter (Signed)
Egg free shot came in, Pt advised and scheduled.

## 2018-03-26 ENCOUNTER — Ambulatory Visit (INDEPENDENT_AMBULATORY_CARE_PROVIDER_SITE_OTHER): Payer: Medicare Other | Admitting: Family Medicine

## 2018-03-26 VITALS — Temp 98.0°F

## 2018-03-26 DIAGNOSIS — I10 Essential (primary) hypertension: Secondary | ICD-10-CM

## 2018-03-26 DIAGNOSIS — Z0000 Encounter for general adult medical examination without abnormal findings: Secondary | ICD-10-CM

## 2018-03-26 DIAGNOSIS — Z23 Encounter for immunization: Secondary | ICD-10-CM | POA: Diagnosis not present

## 2018-03-26 NOTE — Progress Notes (Signed)
Pt came into clinic for egg free vaccine. Tolerated injection well, no immediate complications. Does request referral for a eye dr. Prefers [facility]. Pended.

## 2018-03-31 ENCOUNTER — Other Ambulatory Visit: Payer: Self-pay | Admitting: Family Medicine

## 2018-04-10 ENCOUNTER — Other Ambulatory Visit: Payer: Self-pay | Admitting: Family Medicine

## 2018-04-10 DIAGNOSIS — Z1231 Encounter for screening mammogram for malignant neoplasm of breast: Secondary | ICD-10-CM

## 2018-04-20 ENCOUNTER — Other Ambulatory Visit: Payer: Self-pay | Admitting: Family Medicine

## 2018-04-21 DIAGNOSIS — H0100A Unspecified blepharitis right eye, upper and lower eyelids: Secondary | ICD-10-CM | POA: Diagnosis not present

## 2018-04-21 DIAGNOSIS — H02834 Dermatochalasis of left upper eyelid: Secondary | ICD-10-CM | POA: Diagnosis not present

## 2018-04-21 DIAGNOSIS — H0100B Unspecified blepharitis left eye, upper and lower eyelids: Secondary | ICD-10-CM | POA: Diagnosis not present

## 2018-04-21 DIAGNOSIS — H02831 Dermatochalasis of right upper eyelid: Secondary | ICD-10-CM | POA: Diagnosis not present

## 2018-04-21 DIAGNOSIS — H527 Unspecified disorder of refraction: Secondary | ICD-10-CM | POA: Diagnosis not present

## 2018-04-21 DIAGNOSIS — H5053 Vertical heterophoria: Secondary | ICD-10-CM | POA: Diagnosis not present

## 2018-04-21 DIAGNOSIS — H1851 Endothelial corneal dystrophy: Secondary | ICD-10-CM | POA: Diagnosis not present

## 2018-04-21 DIAGNOSIS — H25813 Combined forms of age-related cataract, bilateral: Secondary | ICD-10-CM | POA: Diagnosis not present

## 2018-04-29 ENCOUNTER — Telehealth: Payer: Self-pay | Admitting: Family Medicine

## 2018-04-29 DIAGNOSIS — H269 Unspecified cataract: Secondary | ICD-10-CM | POA: Insufficient documentation

## 2018-04-29 NOTE — Telephone Encounter (Signed)
Ophthalmology records received.  Notes to be sent to scan.

## 2018-05-07 ENCOUNTER — Other Ambulatory Visit: Payer: Self-pay | Admitting: Family Medicine

## 2018-05-14 ENCOUNTER — Ambulatory Visit
Admission: RE | Admit: 2018-05-14 | Discharge: 2018-05-14 | Disposition: A | Discharge status: Home / Self Care | Payer: Medicare Other | Source: Ambulatory Visit | Attending: Family Medicine | Admitting: Family Medicine

## 2018-05-14 DIAGNOSIS — Z1231 Encounter for screening mammogram for malignant neoplasm of breast: Secondary | ICD-10-CM | POA: Diagnosis not present

## 2018-05-21 ENCOUNTER — Other Ambulatory Visit: Payer: Self-pay | Admitting: Family Medicine

## 2018-06-04 ENCOUNTER — Other Ambulatory Visit: Payer: Self-pay | Admitting: Family Medicine

## 2018-06-19 ENCOUNTER — Other Ambulatory Visit: Payer: Self-pay | Admitting: Family Medicine

## 2018-06-22 ENCOUNTER — Other Ambulatory Visit: Payer: Self-pay | Admitting: Pulmonary Disease

## 2018-06-22 ENCOUNTER — Other Ambulatory Visit: Payer: Self-pay | Admitting: Primary Care

## 2018-08-05 IMAGING — DX DG CHEST 2V
2 series · 2 of 2 positions shown · non-contrast
Comparison: 09/03/2016

CLINICAL DATA: Productive cough.

EXAM:
CHEST - 2 VIEW

[chest pa]
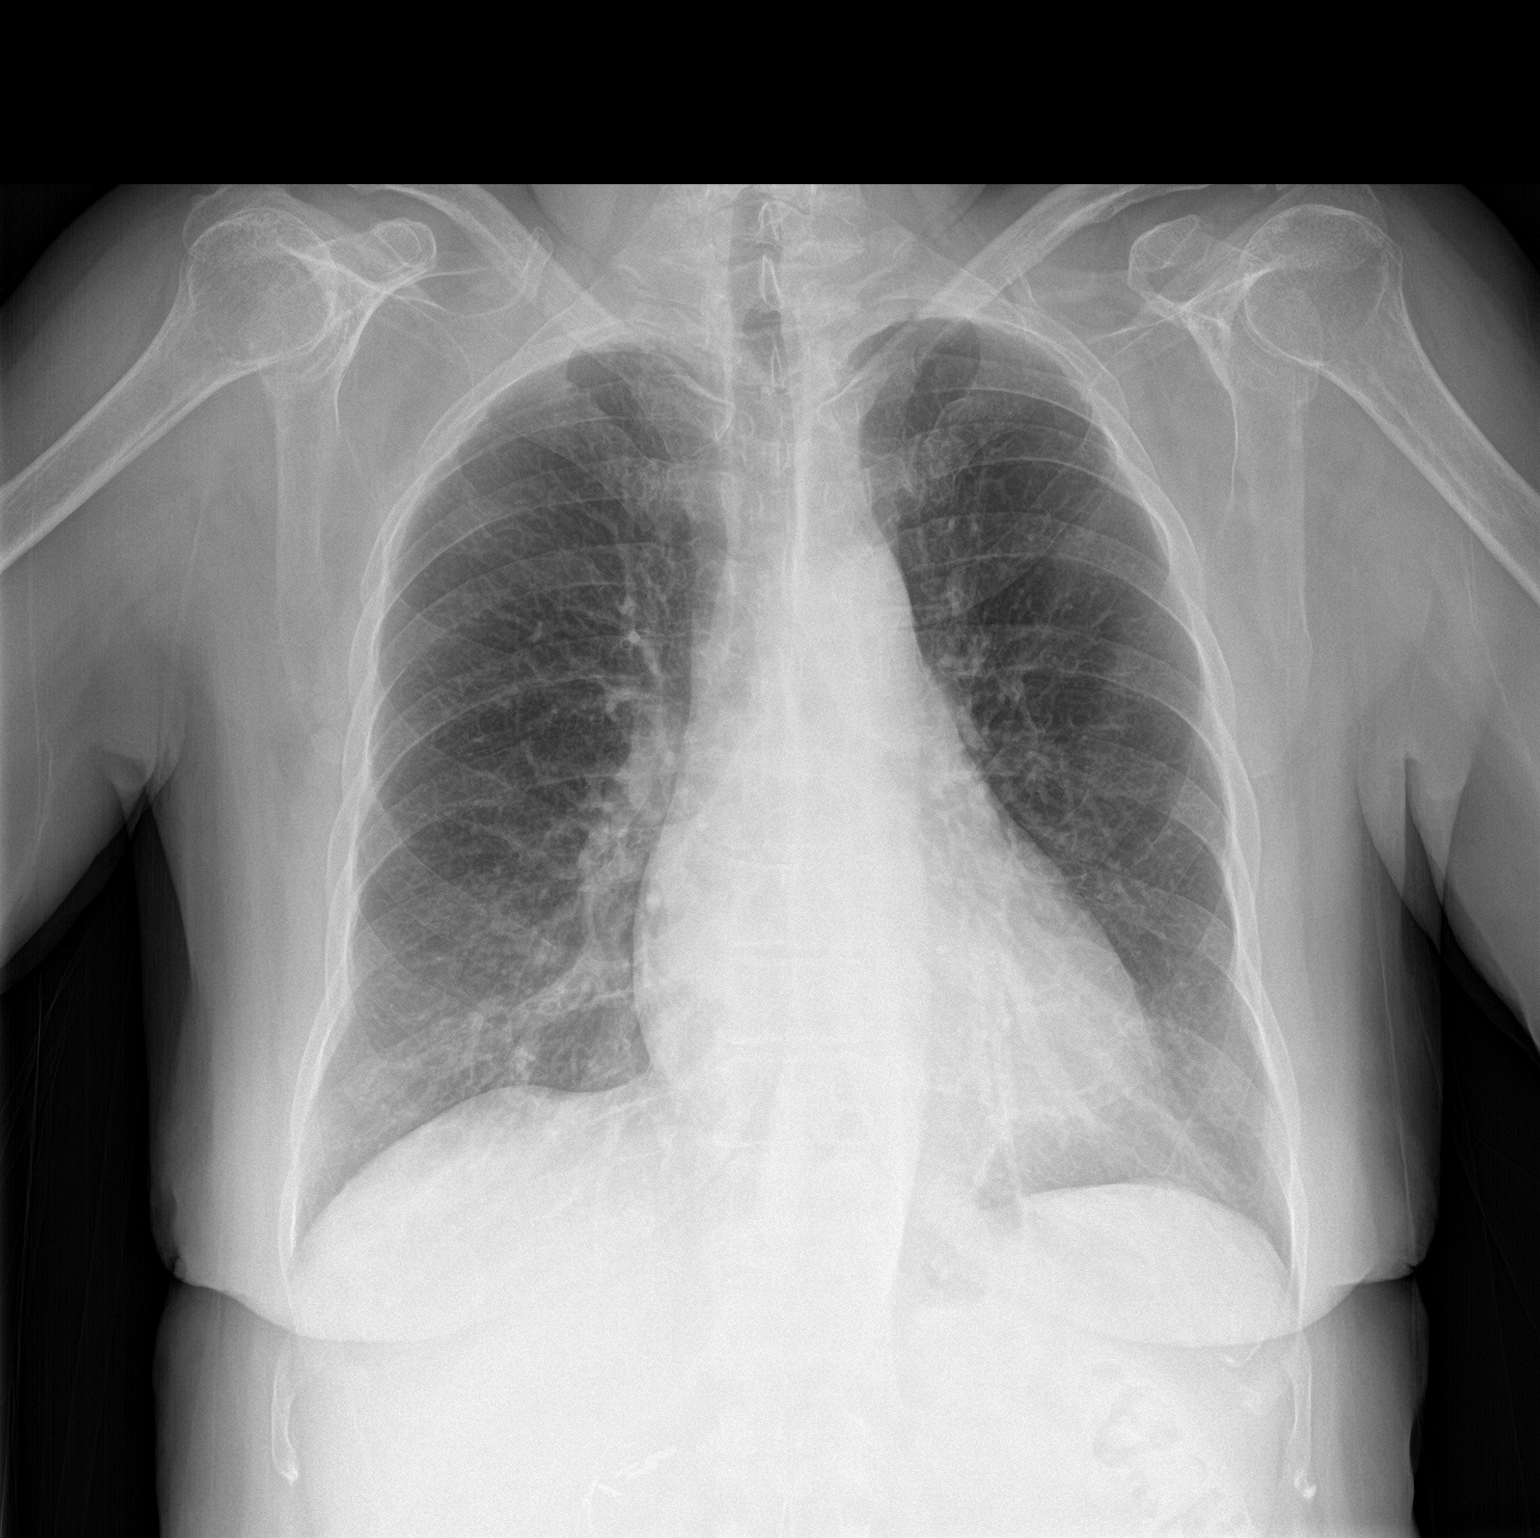

[chest lat]
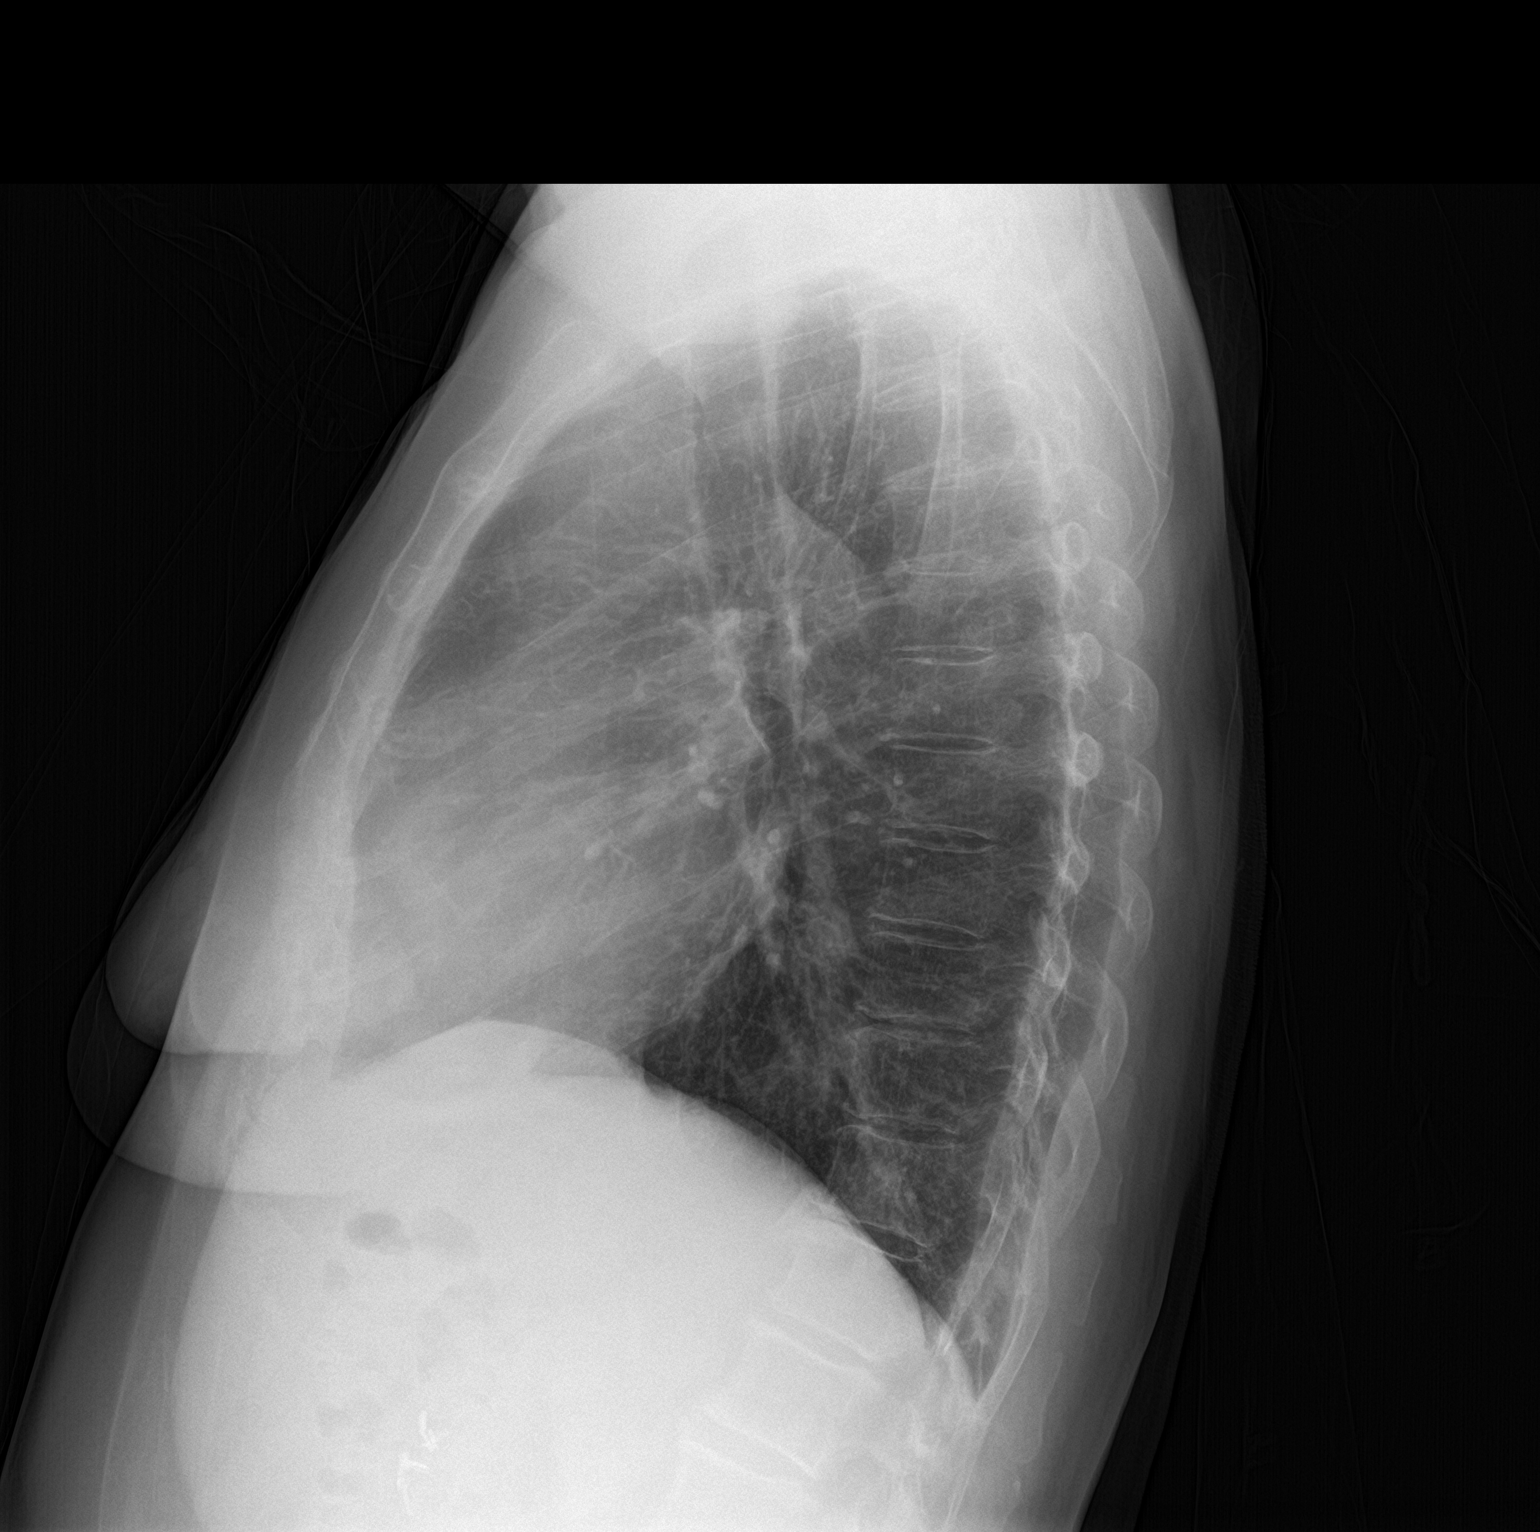

[2 of 2 positions shown; findings below may reference images not displayed]

FINDINGS: The heart size and mediastinal contours are within normal limits.
Mild bibasilar atelectasis/scarring. There is no evidence of
pulmonary edema, consolidation, pneumothorax, nodule or pleural
fluid. The visualized skeletal structures are unremarkable.
IMPRESSION: No active cardiopulmonary disease.

## 2018-08-06 ENCOUNTER — Encounter: Payer: Self-pay | Admitting: Family Medicine

## 2018-08-06 ENCOUNTER — Ambulatory Visit (INDEPENDENT_AMBULATORY_CARE_PROVIDER_SITE_OTHER): Payer: Medicare Other | Admitting: Family Medicine

## 2018-08-06 VITALS — BP 149/78 | HR 58 | Temp 98.2°F | Wt 183.0 lb

## 2018-08-06 DIAGNOSIS — G4733 Obstructive sleep apnea (adult) (pediatric): Secondary | ICD-10-CM

## 2018-08-06 DIAGNOSIS — I10 Essential (primary) hypertension: Secondary | ICD-10-CM | POA: Diagnosis not present

## 2018-08-06 DIAGNOSIS — J449 Chronic obstructive pulmonary disease, unspecified: Secondary | ICD-10-CM

## 2018-08-06 MED ORDER — IPRATROPIUM-ALBUTEROL 20-100 MCG/ACT IN AERS: 1.0000 | INHALATION_SPRAY | Freq: Four times a day (QID) | RESPIRATORY_TRACT | 5 refills | Status: DC | PRN

## 2018-08-06 MED ORDER — BENZONATATE 200 MG PO CAPS: 200.0000 mg | ORAL_CAPSULE | Freq: Three times a day (TID) | ORAL | 0 refills | Status: DC | PRN

## 2018-08-06 MED ORDER — LOSARTAN POTASSIUM 100 MG PO TABS: 100.0000 mg | ORAL_TABLET | Freq: Every day | ORAL | 1 refills | Status: DC

## 2018-08-06 MED ORDER — AZITHROMYCIN 250 MG PO TABS: 250.0000 mg | ORAL_TABLET | Freq: Every day | ORAL | 0 refills | Status: DC

## 2018-08-06 MED ORDER — TIOTROPIUM BROMIDE MONOHYDRATE 18 MCG IN CAPS: 18.0000 ug | ORAL_CAPSULE | Freq: Every day | RESPIRATORY_TRACT | 3 refills | Status: DC

## 2018-08-06 NOTE — Progress Notes (Signed)
Kimberly Jackson is a 73 y.o. female, with a history of Zenker diverticulum and mild bronchiectasis, who presents to [facility] today for cough. Kimberly Jackson woke up on Sunday with headache, sore throat, congestion, cough, and chest congestion. She has also had chills, body aches, runny nose, and some wheezing today. Denies fever or worsening shortness of breath. She has been taking chlorocidin which has helped some. She has been taking her Advair, but no other inhalers and no nebulizer treatment. According to her pulmonologist, she is to get antibiotics when she has cough with mucus production due to her having bronchiectasis.   HTN: [name]'s BP has not been well-controlled on her current dose of losartan. Denies chest pain, palpitations, and dyspnea.   Bronchiectasis: Kimberly Jackson has run out of some of her inhalers, Combivent and Spiriva.    Additionally patient has a previous diagnosis of sleep apnea.  She is not been using her CPAP as it is been uncomfortable.  She does note persistent fatigue and thinks that she probably should have a CPAP again.   ROS as above:  Exam:  BP (!) 149/78   Pulse (!) 58   Temp 98.2 F (36.8 C) (Oral)   Wt 183 lb (83 kg)   SpO2 99%   BMI 32.83 kg/m  Wt Readings from Last 5 Encounters:  08/06/18 183 lb (83 kg)  03/13/18 174 lb (78.9 kg)  02/05/18 169 lb (76.7 kg)  01/29/18 166 lb (75.3 kg)  12/18/17 177 lb (80.3 kg)    Gen: Well NAD HEENT: EOMI,  MMM Lungs: Normal work of breathing.  Coarse breath sounds bilaterally. Heart: RRR no MRG Abd: NABS, Soft. Nondistended, Nontender Exts: Brisk capillary refill, warm and well perfused.      Assessment and Plan: 73 y.o. female with  Cough with mucus production: Kimberly Jackson has had a headache, cough, sore throat, and congestion since Sunday. She does not have increased breath  sounds, wheezes, or crackles on lung exam. Most likely a cold. Prescribed tessalon perles for cough and azithromycin. Follow-up in one month.   HTN: Kimberly Jackson's blood pressure is not well-controlled on her current dose of losartan. BP today was 149/78. Increased losartan to 100 mg.   Bronchiectasis: Refilled her inhalers.   Sleep apnea: Off of CPAP.  Daytime fatigue.  Plan to reorder sleep study to reassess.  Recheck 1 month. No orders of the defined types were placed in this encounter.  Meds ordered this encounter  Medications  . losartan (COZAAR) 100 MG tablet    Sig: Take 1 tablet (100 mg total) by mouth daily.    Dispense:  90 tablet    Refill:  1  . tiotropium (SPIRIVA) 18 MCG inhalation capsule    Sig: Place 1 capsule (18 mcg total) into inhaler and inhale daily.    Dispense:  90 capsule    Refill:  3  . Ipratropium-Albuterol (COMBIVENT) 20-100 MCG/ACT AERS respimat    Sig: Inhale 1 puff into the lungs every 6 (six) hours as needed for wheezing or shortness of breath.    Dispense:  1 Inhaler    Refill:  5  . benzonatate (TESSALON) 200 MG capsule    Sig: Take 1 capsule (200 mg total) by mouth 3 (three) times daily as needed for cough.    Dispense:  45 capsule    Refill:  0  . azithromycin (ZITHROMAX) 250 MG tablet  Sig: Take 1 tablet (250 mg total) by mouth daily. Take first 2 tablets together, then 1 every day until finished.    Dispense:  6 tablet    Refill:  0     Historical information moved to improve visibility of documentation.  Past Medical History:  Diagnosis Date  . Alcohol abuse   . COPD (chronic obstructive pulmonary disease) ([place])   . Depression   . Reflux    Past Surgical History:  Procedure Laterality Date  . ABDOMINAL HYSTERECTOMY    . APPENDECTOMY     73 yo  . BLADDER SURGERY  2015  . CHOLECYSTECTOMY    . REPLACEMENT TOTAL KNEE    . ROTATOR CUFF REPAIR    . TONSILLECTOMY     Social History   Tobacco Use  . Smoking status: Never  Smoker  . Smokeless tobacco: Never Used  Substance Use Topics  . Alcohol use: No    Alcohol/week: 0.0 standard drinks   family history includes Alcohol abuse in her mother.  Medications: Current Outpatient Medications  Medication Sig Dispense Refill  . AMBULATORY NON FORMULARY MEDICATION Blood pressure monitor use daily for hypertension. 1 Device 0  . Cholecalciferol (VITAMIN D3) 5000 units CAPS Take 5,000 Units by mouth daily.    . diclofenac sodium (VOLTAREN) 1 % GEL APPLY 2 GRAM FOUR TIMES DAILY TO AFFECTED JOINT 100 g 0  . digoxin (LANOXIN) 0.125 MG tablet TAKE 1 TABLET BY MOUTH EVERY DAY 90 tablet 3  . fluticasone (FLONASE) 50 MCG/ACT nasal spray SPRAY 1 SPRAY IN EACH NOSTRILS BID  0  . fluticasone-salmeterol (ADVAIR HFA) 230-21 MCG/ACT inhaler Inhale 2 puffs into the lungs 2 (two) times daily. 1 Inhaler 12  . Ipratropium-Albuterol (COMBIVENT) 20-100 MCG/ACT AERS respimat Inhale 1 puff into the lungs every 6 (six) hours as needed for wheezing or shortness of breath. 1 Inhaler 5  . ipratropium-albuterol (DUONEB) 0.5-2.5 (3) MG/3ML SOLN Use 1 every 6 hours as needed for wheezing 360 mL 1  . losartan (COZAAR) 100 MG tablet Take 1 tablet (100 mg total) by mouth daily. 90 tablet 1  . metoprolol succinate (TOPROL-XL) 100 MG 24 hr tablet TAKE 1 TABLET BY MOUTH DAILY. TAKE WITH OR IMMEDIATELY FOLLOWING A MEAL. 90 tablet 0  . omeprazole (PRILOSEC) 40 MG capsule TAKE 1 CAPSULE(40 MG) BY MOUTH DAILY 90 capsule 3  . ondansetron (ZOFRAN) 4 MG tablet Take 1 tablet (4 mg total) by mouth every 8 (eight) hours as needed for nausea or vomiting. 20 tablet 0  . tiotropium (SPIRIVA) 18 MCG inhalation capsule Place 1 capsule (18 mcg total) into inhaler and inhale daily. 90 capsule 3  . triamterene-hydrochlorothiazide (MAXZIDE-25) 37.5-25 MG tablet TAKE 1 TABLET BY MOUTH DAILY 30 tablet 0  . azithromycin (ZITHROMAX) 250 MG tablet Take 1 tablet (250 mg total) by mouth daily. Take first 2 tablets together, then  1 every day until finished. 6 tablet 0  . benzonatate (TESSALON) 200 MG capsule Take 1 capsule (200 mg total) by mouth 3 (three) times daily as needed for cough. 45 capsule 0   No current facility-administered medications for this visit.    Allergies  Allergen Reactions  . Corticosteroids Other (See Comments)    Suicidal  . Compazine [Prochlorperazine] Other (See Comments)    Acute dystonic reaction type  . Eggs Or Egg-Derived Products Nausea And Vomiting  . Nickel Rash    skin  . Penicillins Rash     Discussed warning signs or symptoms. Please see discharge  instructions. Patient expresses understanding.  I personally was present and performed or re-performed the history, physical exam and medical decision-making activities of this service and have verified that the service and findings are accurately documented in the student's note. ___________________________________________ Lynne Leader M.D., ABFM., CAQSM. Primary Care and Sports Medicine Adjunct Instructor of [employer] [facility] [employer]

## 2018-08-06 NOTE — Patient Instructions (Signed)
Thank you for coming in today. Increase losartan to 100 mg daily.  Continue other blood pressure medicines.   Continue inhaillers.  Use tessalon pearles for cough as needed.  Take azithromycin antibiotic.   Recheck in 1 month.    Call or go to the emergency room if you get worse, have trouble breathing, have chest pains, or palpitations.

## 2018-08-10 ENCOUNTER — Telehealth: Payer: Self-pay

## 2018-08-10 MED ORDER — DOXYCYCLINE HYCLATE 100 MG PO TABS: 100.0000 mg | ORAL_TABLET | Freq: Two times a day (BID) | ORAL | 0 refills | Status: DC

## 2018-08-10 NOTE — Telephone Encounter (Signed)
It probably would be okay however reasonable to switch to doxycycline.  Doxycycline prescription sent to pharmacy.

## 2018-08-10 NOTE — Telephone Encounter (Signed)
Patient called stated that when she went to pick up azithromycin the pharmacist told her that it would counteract with her Digoxin. Patient have not started the antibiotic yet and she wants to know if she should take it. Please advise. Kimberly Jackson,CMA

## 2018-08-10 NOTE — Telephone Encounter (Signed)
Patient has been advised of new medication sent to pharmacy. Rhonda Cunningham,CMA

## 2018-08-18 ENCOUNTER — Other Ambulatory Visit: Payer: Self-pay | Admitting: Family Medicine

## 2018-09-03 ENCOUNTER — Ambulatory Visit (INDEPENDENT_AMBULATORY_CARE_PROVIDER_SITE_OTHER): Payer: Medicare Other | Admitting: Family Medicine

## 2018-09-03 ENCOUNTER — Encounter: Payer: Self-pay | Admitting: Family Medicine

## 2018-09-03 VITALS — BP 150/41 | HR 60 | Wt 184.0 lb

## 2018-09-03 DIAGNOSIS — G4733 Obstructive sleep apnea (adult) (pediatric): Secondary | ICD-10-CM | POA: Diagnosis not present

## 2018-09-03 DIAGNOSIS — J449 Chronic obstructive pulmonary disease, unspecified: Secondary | ICD-10-CM | POA: Diagnosis not present

## 2018-09-03 DIAGNOSIS — G5602 Carpal tunnel syndrome, left upper limb: Secondary | ICD-10-CM | POA: Diagnosis not present

## 2018-09-03 DIAGNOSIS — I10 Essential (primary) hypertension: Secondary | ICD-10-CM

## 2018-09-03 NOTE — Patient Instructions (Addendum)
Thank you for coming in today.  Use the cock up wrist splint at night. Let me know if not doing well.   Try the 100mg  dose of losartan.    Continue medicines for breathing.   Recheck in 3 months.   Return sooner if needed.

## 2018-09-03 NOTE — Progress Notes (Signed)
Kimberly Jackson is a 73 y.o. female who presents to [facility] today for follow-up on cough, sleep apnea, hypertension, and left hand tingling.  Cough: Kimberly Jackson's cough has improved with taking azithromycin and tessalon perles. She still feels a little congested. Does not take OTC medications.   Sleep apnea: Kimberly Jackson has a diagnosis of sleep apnea, but has not been using her CPAP for a while. She has a sleep study scheduled for March 3rd, so she can start using her CPAP again.   Hypertension: At her last appointment, losartan was increased to 100 mg. She started having eye swelling in the mornings and was concerned that the losartan was causing this. So she has been taking a half pill some days and a whole pill some days. She does not check her BP at home.    Left hand tingling: Kimberly Jackson has had tingling in her left hand for the past three weeks. She feels that it is in all five of her fingers. The pain and tingling wakes her up at night. She had a previous nerve conduction study in [place], [place] by Kimberly Jackson, and he told her that the tingling was related to her sleep apnea. She has used a wrist splint at night when this has occurred in the past. She has less severe symptoms in her right hand on occasion.   ROS as above:  Exam:  BP (!) 150/41   Pulse 60   Wt 184 lb (83.5 kg)   BMI 33.01 kg/m  Wt Readings from Last 5 Encounters:  09/03/18 184 lb (83.5 kg)  08/06/18 183 lb (83 kg)  03/13/18 174 lb (78.9 kg)  02/05/18 169 lb (76.7 kg)  01/29/18 166 lb (75.3 kg)    Gen: Well NAD HEENT: EOMI,  MMM Lungs: Normal work of breathing. CTABL Heart: RRR no MRG Abd: NABS, Soft. Nondistended, Nontender Exts: Brisk capillary refill, warm and well perfused.  Left hand: Mildly positive Tinel's sign.   Lab and Radiology Results No results found for this or any  previous visit (from the past 72 hour(s)). No results found.    Assessment and Plan: 73 y.o. female with  Cough: Improved. No need for further treatment at this time. Continue inhalers for bronchiectasis.  Sleep apnea: Get sleep study on March 3rd. Will follow-up on these results.  Hypertension: Her BP is elevated today, 150/41. This makes sense as she has not been regularly taking the increased dose of losartan. Discussed with pt that eye swelling is not a likely side effect of losartan. Try taking losartan 100 mg daily. If eye swelling starts again, she will let me know. Follow-up in three months or sooner if needed.   Left hand tingling: She reports left hand tingling in all of her fingers that wakes her up at night. She has had this in the past. Likely carpal tunnel. Advised her to use a wrist splint at night. She will also try to make note of if her pinky is also tingling. Will call Kimberly Jackson office to get the results of her nerve conduction study. Follow-up in three months.    PDMP not reviewed this encounter. No orders of the defined types were placed in this encounter.  No orders of the defined types were placed in this encounter.    Historical information moved to improve visibility of documentation.  Past Medical History:  Diagnosis Date  . Alcohol abuse   .  COPD (chronic obstructive pulmonary disease) ([place])   . Depression   . Reflux    Past Surgical History:  Procedure Laterality Date  . ABDOMINAL HYSTERECTOMY    . APPENDECTOMY     73 yo  . BLADDER SURGERY  2015  . CHOLECYSTECTOMY    . REPLACEMENT TOTAL KNEE    . ROTATOR CUFF REPAIR    . TONSILLECTOMY     Social History   Tobacco Use  . Smoking status: Never Smoker  . Smokeless tobacco: Never Used  Substance Use Topics  . Alcohol use: No    Alcohol/week: 0.0 standard drinks   family history includes Alcohol abuse in her mother.  Medications: Current Outpatient Medications  Medication Sig Dispense  Refill  . AMBULATORY NON FORMULARY MEDICATION Blood pressure monitor use daily for hypertension. 1 Device 0  . Cholecalciferol (VITAMIN D3) 5000 units CAPS Take 5,000 Units by mouth daily.    . diclofenac sodium (VOLTAREN) 1 % GEL APPLY 2 GRAM FOUR TIMES DAILY TO AFFECTED JOINT 100 g 0  . digoxin (LANOXIN) 0.125 MG tablet TAKE 1 TABLET BY MOUTH EVERY DAY 90 tablet 3  . fluticasone (FLONASE) 50 MCG/ACT nasal spray SPRAY 1 SPRAY IN EACH NOSTRILS BID  0  . fluticasone-salmeterol (ADVAIR HFA) 230-21 MCG/ACT inhaler Inhale 2 puffs into the lungs 2 (two) times daily. 1 Inhaler 12  . Ipratropium-Albuterol (COMBIVENT) 20-100 MCG/ACT AERS respimat Inhale 1 puff into the lungs every 6 (six) hours as needed for wheezing or shortness of breath. 1 Inhaler 5  . ipratropium-albuterol (DUONEB) 0.5-2.5 (3) MG/3ML SOLN Use 1 every 6 hours as needed for wheezing 360 mL 1  . losartan (COZAAR) 100 MG tablet Take 1 tablet (100 mg total) by mouth daily. 90 tablet 1  . metoprolol succinate (TOPROL-XL) 100 MG 24 hr tablet TAKE 1 TABLET BY MOUTH DAILY. TAKE WITH OR IMMEDIATELY FOLLOWING A MEAL. 90 tablet 0  . omeprazole (PRILOSEC) 40 MG capsule TAKE 1 CAPSULE(40 MG) BY MOUTH DAILY 90 capsule 3  . ondansetron (ZOFRAN) 4 MG tablet Take 1 tablet (4 mg total) by mouth every 8 (eight) hours as needed for nausea or vomiting. 20 tablet 0  . tiotropium (SPIRIVA) 18 MCG inhalation capsule Place 1 capsule (18 mcg total) into inhaler and inhale daily. 90 capsule 3  . triamterene-hydrochlorothiazide (MAXZIDE-25) 37.5-25 MG tablet TAKE 1 TABLET BY MOUTH DAILY 30 tablet 0   No current facility-administered medications for this visit.    Allergies  Allergen Reactions  . Corticosteroids Other (See Comments)    Suicidal  . Compazine [Prochlorperazine] Other (See Comments)    Acute dystonic reaction type  . Eggs Or Egg-Derived Products Nausea And Vomiting  . Nickel Rash    skin  . Penicillins Rash     Discussed warning signs or  symptoms. Please see discharge instructions. Patient expresses understanding.   I personally was present and performed or re-performed the history, physical exam and medical decision-making activities of this service and have verified that the service and findings are accurately documented in the student's note. ___________________________________________ Lynne Leader M.D., ABFM., CAQSM. Primary Care and Sports Medicine Adjunct Instructor of [employer] [facility] [employer] Medicine

## 2018-09-22 DIAGNOSIS — R0683 Snoring: Secondary | ICD-10-CM | POA: Diagnosis not present

## 2018-09-22 DIAGNOSIS — G4719 Other hypersomnia: Secondary | ICD-10-CM | POA: Diagnosis not present

## 2018-09-22 DIAGNOSIS — R05 Cough: Secondary | ICD-10-CM | POA: Diagnosis not present

## 2018-10-21 ENCOUNTER — Other Ambulatory Visit: Payer: Self-pay | Admitting: Family Medicine

## 2018-11-05 ENCOUNTER — Other Ambulatory Visit: Payer: Self-pay | Admitting: Family Medicine

## 2018-11-21 ENCOUNTER — Other Ambulatory Visit: Payer: Self-pay | Admitting: Family Medicine

## 2018-11-23 ENCOUNTER — Other Ambulatory Visit: Payer: Self-pay | Admitting: Family Medicine

## 2018-12-03 ENCOUNTER — Encounter: Payer: Self-pay | Admitting: Family Medicine

## 2018-12-03 ENCOUNTER — Ambulatory Visit (INDEPENDENT_AMBULATORY_CARE_PROVIDER_SITE_OTHER): Payer: Medicare Other | Admitting: Family Medicine

## 2018-12-03 VITALS — BP 121/68 | HR 60 | Temp 97.8°F | Wt 191.0 lb

## 2018-12-03 DIAGNOSIS — G5600 Carpal tunnel syndrome, unspecified upper limb: Secondary | ICD-10-CM | POA: Insufficient documentation

## 2018-12-03 DIAGNOSIS — J449 Chronic obstructive pulmonary disease, unspecified: Secondary | ICD-10-CM

## 2018-12-03 DIAGNOSIS — G5603 Carpal tunnel syndrome, bilateral upper limbs: Secondary | ICD-10-CM

## 2018-12-03 DIAGNOSIS — G4733 Obstructive sleep apnea (adult) (pediatric): Secondary | ICD-10-CM | POA: Diagnosis not present

## 2018-12-03 DIAGNOSIS — I10 Essential (primary) hypertension: Secondary | ICD-10-CM

## 2018-12-03 DIAGNOSIS — R7303 Prediabetes: Secondary | ICD-10-CM | POA: Diagnosis not present

## 2018-12-03 DIAGNOSIS — I480 Paroxysmal atrial fibrillation: Secondary | ICD-10-CM | POA: Diagnosis not present

## 2018-12-03 LAB — POCT GLYCOSYLATED HEMOGLOBIN (HGB A1C): Hemoglobin A1C: 5.5 % (ref 4.0–5.6)

## 2018-12-03 MED ORDER — GABAPENTIN 300 MG PO CAPS: ORAL_CAPSULE | ORAL | 3 refills | Status: DC

## 2018-12-03 NOTE — Patient Instructions (Signed)
Thank you for coming in today. For hand tingling. I think this is carpal tunnel.  Plan for trial of gabapentin at bedtime. Ok to increase to 3x daily if needed.  If not helpful in about 2 weeks let me know and I will arrange for a nerve test.    Carpal Tunnel Syndrome  Carpal tunnel syndrome is a condition that causes pain in your hand and arm. The carpal tunnel is a narrow area that is on the palm side of your wrist. Repeated wrist motion or certain diseases may cause swelling in the tunnel. This swelling can pinch the main nerve in the wrist (median nerve). What are the causes? This condition may be caused by:  Repeated wrist motions.  Wrist injuries.  Arthritis.  A sac of fluid (cyst) or abnormal growth (tumor) in the carpal tunnel.  Fluid buildup during pregnancy. Sometimes the cause is not known. What increases the risk? The following factors may make you more likely to develop this condition:  Having a job in which you move your wrist in the same way many times. This includes jobs like being a Software engineer or a Scientist, water quality.  Being a woman.  Having other health conditions, such as: ? Diabetes. ? Obesity. ? A thyroid gland that is not active enough (hypothyroidism). ? Kidney failure. What are the signs or symptoms? Symptoms of this condition include:  A tingling feeling in your fingers.  Tingling or a loss of feeling (numbness) in your hand.  Pain in your entire arm. This pain may get worse when you bend your wrist and elbow for a long time.  Pain in your wrist that goes up your arm to your shoulder.  Pain that goes down into your palm or fingers.  A weak feeling in your hands. You may find it hard to grab and hold items. You may feel worse at night. How is this diagnosed? This condition is diagnosed with a medical history and physical exam. You may also have tests, such as:  Electromyogram (EMG). This test checks the signals that the nerves send to the muscles.   Nerve conduction study. This test checks how well signals pass through your nerves.  Imaging tests, such as X-rays, ultrasound, and MRI. These tests check for what might be the cause of your condition. How is this treated? This condition may be treated with:  Lifestyle changes. You will be asked to stop or change the activity that caused your problem.  Doing exercise and activities that make bones and muscles stronger (physical therapy).  Learning how to use your hand again (occupational therapy).  Medicines for pain and swelling (inflammation). You may have injections in your wrist.  A wrist splint.  Surgery. Follow these instructions at home: If you have a splint:  Wear the splint as told by your doctor. Remove it only as told by your doctor.  Loosen the splint if your fingers: ? Tingle. ? Lose feeling (become numb). ? Turn cold and blue.  Keep the splint clean.  If the splint is not waterproof: ? Do not let it get wet. ? Cover it with a watertight covering when you take a bath or a shower. Managing pain, stiffness, and swelling   If told, put ice on the painful area: ? If you have a removable splint, remove it as told by your doctor. ? Put ice in a plastic bag. ? Place a towel between your skin and the bag. ? Leave the ice on for 20 minutes, 2-3  times per day. General instructions  Take over-the-counter and prescription medicines only as told by your doctor.  Rest your wrist from any activity that may cause pain. If needed, talk with your boss at work about changes that can help your wrist heal.  Do any exercises as told by your doctor, physical therapist, or occupational therapist.  Keep all follow-up visits as told by your doctor. This is important. Contact a doctor if:  You have new symptoms.  Medicine does not help your pain.  Your symptoms get worse. Get help right away if:  You have very bad numbness or tingling in your wrist or hand. Summary   Carpal tunnel syndrome is a condition that causes pain in your hand and arm.  It is often caused by repeated wrist motions.  Lifestyle changes and medicines are used to treat this problem. Surgery may help in very bad cases.  Follow your doctor's instructions about wearing a splint, resting your wrist, keeping follow-up visits, and calling for help. This information is not intended to replace advice given to you by your health care provider. Make sure you discuss any questions you have with your health care provider. Document Released: 06/27/2011 Document Revised: 11/14/2017 Document Reviewed: 11/14/2017 Elsevier Interactive Patient Education  2019 [employer].   Gabapentin capsules or tablets What is this medicine? GABAPENTIN (GA ba pen tin) is used to control seizures in certain types of epilepsy. It is also used to treat certain types of nerve pain. This medicine may be used for other purposes; ask your health care provider or pharmacist if you have questions. COMMON BRAND NAME(S): Active-PAC with Gabapentin, Gabarone, Neurontin What should I tell my health care provider before I take this medicine? They need to know if you have any of these conditions: -kidney disease -suicidal thoughts, plans, or attempt; a previous suicide attempt by you or a family member -an unusual or allergic reaction to gabapentin, other medicines, foods, dyes, or preservatives -pregnant or trying to get pregnant -breast-feeding How should I use this medicine? Take this medicine by mouth with a glass of water. Follow the directions on the prescription label. You can take it with or without food. If it upsets your stomach, take it with food. Take your medicine at regular intervals. Do not take it more often than directed. Do not stop taking except on your doctor's advice. If you are directed to break the 600 or 800 mg tablets in half as part of your dose, the extra half tablet should be used for the next dose.  If you have not used the extra half tablet within 28 days, it should be thrown away. A special MedGuide will be given to you by the pharmacist with each prescription and refill. Be sure to read this information carefully each time. Talk to your pediatrician regarding the use of this medicine in children. While this drug may be prescribed for children as young as 3 years for selected conditions, precautions do apply. Overdosage: If you think you have taken too much of this medicine contact a poison control center or emergency room at once. NOTE: This medicine is only for you. Do not share this medicine with others. What if I miss a dose? If you miss a dose, take it as soon as you can. If it is almost time for your next dose, take only that dose. Do not take double or extra doses. What may interact with this medicine? Do not take this medicine with any of the following  medications: -other gabapentin products This medicine may also interact with the following medications: -alcohol -antacids -antihistamines for allergy, cough and cold -certain medicines for anxiety or sleep -certain medicines for depression or psychotic disturbances -homatropine; hydrocodone -naproxen -narcotic medicines (opiates) for pain -phenothiazines like chlorpromazine, mesoridazine, prochlorperazine, thioridazine This list may not describe all possible interactions. Give your health care provider a list of all the medicines, herbs, non-prescription drugs, or dietary supplements you use. Also tell them if you smoke, drink alcohol, or use illegal drugs. Some items may interact with your medicine. What should I watch for while using this medicine? Visit your doctor or health care professional for regular checks on your progress. You may want to keep a record at home of how you feel your condition is responding to treatment. You may want to share this information with your doctor or health care professional at each visit. You  should contact your doctor or health care professional if your seizures get worse or if you have any new types of seizures. Do not stop taking this medicine or any of your seizure medicines unless instructed by your doctor or health care professional. Stopping your medicine suddenly can increase your seizures or their severity. Wear a medical identification bracelet or chain if you are taking this medicine for seizures, and carry a card that lists all your medications. You may get drowsy, dizzy, or have blurred vision. Do not drive, use machinery, or do anything that needs mental alertness until you know how this medicine affects you. To reduce dizzy or fainting spells, do not sit or stand up quickly, especially if you are an older patient. Alcohol can increase drowsiness and dizziness. Avoid alcoholic drinks. Your mouth may get dry. Chewing sugarless gum or sucking hard candy, and drinking plenty of water will help. The use of this medicine may increase the chance of suicidal thoughts or actions. Pay special attention to how you are responding while on this medicine. Any worsening of mood, or thoughts of suicide or dying should be reported to your health care professional right away. Women who become pregnant while using this medicine may enroll in the [place] Pregnancy Registry by calling [phone]. This registry collects information about the safety of antiepileptic drug use during pregnancy. What side effects may I notice from receiving this medicine? Side effects that you should report to your doctor or health care professional as soon as possible: -allergic reactions like skin rash, itching or hives, swelling of the face, lips, or tongue -worsening of mood, thoughts or actions of suicide or dying Side effects that usually do not require medical attention (report to your doctor or health care professional if they continue or are bothersome): -constipation -difficulty  walking or controlling muscle movements -dizziness -nausea -slurred speech -tiredness -tremors -weight gain This list may not describe all possible side effects. Call your doctor for medical advice about side effects. You may report side effects to FDA at 1-800-FDA-1088. Where should I keep my medicine? Keep out of reach of children. This medicine may cause accidental overdose and death if it taken by other adults, children, or pets. Mix any unused medicine with a substance like cat litter or coffee grounds. Then throw the medicine away in a sealed container like a sealed bag or a coffee can with a lid. Do not use the medicine after the expiration date. Store at room temperature between 15 and 30 degrees C (59 and 86 degrees F). NOTE: This sheet is a summary. It  may not cover all possible information. If you have questions about this medicine, talk to your doctor, pharmacist, or health care provider.  2019 Elsevier/Gold Standard (2017-12-11 13:21:44)

## 2018-12-03 NOTE — Progress Notes (Signed)
Kimberly Jackson is a 73 y.o. female who presents to [facility] today for follow-up hypertension breathing and carpal tunnel syndrome:  Carpal tunnel: Patient is bothersome numbness and tingling to her hands bilaterally.  Her left hand is worse than her right but they are still both very symptomatic.  The pain will wake her from sleep at night.  She has tried using a wrist splint which did not help.  She notes a remote history of nerve study in [facility] several years ago.  We have attempted to get records and her unable at this point.  Hypertension: Taking medications listed below.  No chest pain palpitation shortness of breath lightheadedness or dizziness.  Breathing: COPD: Doing well with medications below.  She notes her breathing is better than it has been in quite a while. ROS as above:  Exam:  BP 121/68   Pulse 60   Temp 97.8 F (36.6 C) (Oral)   Wt 191 lb (86.6 kg)   SpO2 100%   BMI 34.27 kg/m  Wt Readings from Last 5 Encounters:  12/03/18 191 lb (86.6 kg)  09/03/18 184 lb (83.5 kg)  08/06/18 183 lb (83 kg)  03/13/18 174 lb (78.9 kg)  02/05/18 169 lb (76.7 kg)    Gen: Well NAD HEENT: EOMI,  MMM Lungs: Normal work of breathing. CTABL Heart: RRR no MRG Abd: NABS, Soft. Nondistended, Nontender Exts: Brisk capillary refill, warm and well perfused.  Hands are normal-appearing bilaterally.  Positive Tinel's carpal tunnel bilaterally.  Normal strength and sensation.      Assessment and Plan: 73 y.o. female with  Carpal tunnel syndrome: Likely cause of him symptoms.  Plan for trial of gabapentin if not better next up would be nerve conduction study.  Not able to do steroid injection given history of severe suicidality and psychosis with steroids in the past.  Hypertension: Doing well.  Continue current regimen.  Currently in sinus  rhythm per exam today.  Paroxysmal atrial visual relation continue to follow  Prediabetes: Doing well.  Will check A1c.  COPD: Doing well continue current regimen.  Recheck 3 months video visit.  PDMP not reviewed this encounter. Orders Placed This Encounter  Procedures  . POCT HgB A1C   Meds ordered this encounter  Medications  . gabapentin (NEURONTIN) 300 MG capsule    Sig: One tab PO qHS for a week, then BID for a week, then TID. May double weekly to a max of 3,600mg /day    Dispense:  180 capsule    Refill:  3     Historical information moved to improve visibility of documentation.  Past Medical History:  Diagnosis Date  . Alcohol abuse   . COPD (chronic obstructive pulmonary disease) ([place])   . Depression   . Reflux    Past Surgical History:  Procedure Laterality Date  . ABDOMINAL HYSTERECTOMY    . APPENDECTOMY     73 yo  . BLADDER SURGERY  2015  . CHOLECYSTECTOMY    . REPLACEMENT TOTAL KNEE    . ROTATOR CUFF REPAIR    . TONSILLECTOMY     Social History   Tobacco Use  . Smoking status: Never Smoker  . Smokeless tobacco: Never Used  Substance Use Topics  . Alcohol use: No    Alcohol/week: 0.0 standard drinks   family history includes Alcohol abuse in her mother.  Medications: Current Outpatient Medications  Medication Sig Dispense Refill  .  ADVAIR HFA 230-21 MCG/ACT inhaler INHALE 2 PUFFS INTO THE LUNGS TWICE DAILY 12 g 1  . AMBULATORY NON FORMULARY MEDICATION Blood pressure monitor use daily for hypertension. 1 Device 0  . Cholecalciferol (VITAMIN D3) 5000 units CAPS Take 5,000 Units by mouth daily.    . diclofenac sodium (VOLTAREN) 1 % GEL APPLY 2 GRAM FOUR TIMES DAILY TO AFFECTED JOINT 100 g 2  . digoxin (LANOXIN) 0.125 MG tablet TAKE 1 TABLET BY MOUTH EVERY DAY 90 tablet 3  . fluticasone (FLONASE) 50 MCG/ACT nasal spray SPRAY 1 SPRAY IN EACH NOSTRILS BID  0  . Ipratropium-Albuterol (COMBIVENT) 20-100 MCG/ACT AERS respimat Inhale 1 puff into the lungs  every 6 (six) hours as needed for wheezing or shortness of breath. 1 Inhaler 5  . ipratropium-albuterol (DUONEB) 0.5-2.5 (3) MG/3ML SOLN Use 1 every 6 hours as needed for wheezing 360 mL 1  . losartan (COZAAR) 100 MG tablet Take 1 tablet (100 mg total) by mouth daily. 90 tablet 1  . metoprolol succinate (TOPROL-XL) 100 MG 24 hr tablet TAKE 1 TABLET BY MOUTH EVERY DAY WITH OR IMMEDIATELY FOLLOWING A MEAL 90 tablet 0  . omeprazole (PRILOSEC) 40 MG capsule TAKE 1 CAPSULE(40 MG) BY MOUTH DAILY 90 capsule 3  . ondansetron (ZOFRAN) 4 MG tablet Take 1 tablet (4 mg total) by mouth every 8 (eight) hours as needed for nausea or vomiting. 20 tablet 0  . tiotropium (SPIRIVA) 18 MCG inhalation capsule Place 1 capsule (18 mcg total) into inhaler and inhale daily. 90 capsule 3  . triamterene-hydrochlorothiazide (MAXZIDE-25) 37.5-25 MG tablet TAKE 1 TABLET BY MOUTH EVERY DAY 90 tablet 0  . gabapentin (NEURONTIN) 300 MG capsule One tab PO qHS for a week, then BID for a week, then TID. May double weekly to a max of 3,600mg /day 180 capsule 3   No current facility-administered medications for this visit.    Allergies  Allergen Reactions  . Corticosteroids Other (See Comments)    Suicidal  . Compazine [Prochlorperazine] Other (See Comments)    Acute dystonic reaction type  . Eggs Or Egg-Derived Products Nausea And Vomiting  . Nickel Rash    skin  . Penicillins Rash     Discussed warning signs or symptoms. Please see discharge instructions. Patient expresses understanding.

## 2018-12-28 ENCOUNTER — Other Ambulatory Visit: Payer: Self-pay | Admitting: Family Medicine

## 2019-02-03 ENCOUNTER — Other Ambulatory Visit: Payer: Self-pay | Admitting: Family Medicine

## 2019-02-17 ENCOUNTER — Other Ambulatory Visit: Payer: Self-pay

## 2019-02-17 ENCOUNTER — Ambulatory Visit (INDEPENDENT_AMBULATORY_CARE_PROVIDER_SITE_OTHER): Payer: Medicare Other | Admitting: Family Medicine

## 2019-02-17 ENCOUNTER — Encounter: Payer: Self-pay | Admitting: Family Medicine

## 2019-02-17 VITALS — BP 134/60 | HR 56 | Temp 98.2°F | Wt 187.0 lb

## 2019-02-17 DIAGNOSIS — G4733 Obstructive sleep apnea (adult) (pediatric): Secondary | ICD-10-CM | POA: Diagnosis not present

## 2019-02-17 DIAGNOSIS — J449 Chronic obstructive pulmonary disease, unspecified: Secondary | ICD-10-CM | POA: Diagnosis not present

## 2019-02-17 DIAGNOSIS — I10 Essential (primary) hypertension: Secondary | ICD-10-CM

## 2019-02-17 DIAGNOSIS — G5603 Carpal tunnel syndrome, bilateral upper limbs: Secondary | ICD-10-CM

## 2019-02-17 MED ORDER — DICLOFENAC SODIUM 1 % TD GEL: 4.0000 g | Freq: Four times a day (QID) | TRANSDERMAL | 2 refills | Status: DC

## 2019-02-17 NOTE — Patient Instructions (Addendum)
Thank you for coming in today.  You should hear about nerve study soon.  Schedule with Dr Amedeo Plenty 2 weeks after the nerve study is set to be done.  Continue conservative management.  The skin issue is a Seborrheic Keratosis. It is a benign skin condition. We will follow it along.    Get labs today.   Seborrheic Keratosis A seborrheic keratosis is a common, noncancerous (benign) skin growth. These growths are velvety, waxy, rough, tan, brown, or black spots that appear on the skin. These skin growths can be flat or raised, and scaly. What are the causes? The cause of this condition is not known. What increases the risk? You are more likely to develop this condition if you:  Have a family history of seborrheic keratosis.  Are 50 or older.  Are pregnant.  Have had estrogen replacement therapy. What are the signs or symptoms? Symptoms of this condition include growths on the face, chest, shoulders, back, or other areas. These growths:  Are usually painless, but may become irritated and itchy.  Can be yellow, brown, black, or other colors.  Are slightly raised or have a flat surface.  Are sometimes rough or wart-like in texture.  Are often velvety or waxy on the surface.  Are round or oval-shaped.  Often occur in groups, but may occur as a single growth. How is this diagnosed? This condition is diagnosed with a medical history and physical exam.  A sample of the growth may be tested (skin biopsy).  You may need to see a skin specialist (dermatologist). How is this treated? Treatment is not usually needed for this condition, unless the growths are irritated or bleed often.  You may also choose to have the growths removed if you do not like their appearance. ? Most commonly, these growths are treated with a procedure in which liquid nitrogen is applied to "freeze" off the growth (cryosurgery). ? They may also be burned off with electricity (electrocautery) or removed by  scraping (curettage). Follow these instructions at home:  Watch your growth for any changes.  Keep all follow-up visits as told by your health care provider. This is important.  Do not scratch or pick at the growth or growths. This can cause them to become irritated or infected. Contact a health care provider if:  You suddenly have many new growths.  Your growth bleeds, itches, or hurts.  Your growth suddenly becomes larger or changes color. Summary  A seborrheic keratosis is a common, noncancerous (benign) skin growth.  Treatment is not usually needed for this condition, unless the growths are irritated or bleed often.  Watch your growth for any changes.  Contact a health care provider if you suddenly have many new growths or your growth suddenly becomes larger or changes color.  Keep all follow-up visits as told by your health care provider. This is important. This information is not intended to replace advice given to you by your health care provider. Make sure you discuss any questions you have with your health care provider. Document Released: 08/10/2010 Document Revised: 11/20/2017 Document Reviewed: 11/20/2017 Elsevier Patient Education  2020 [employer].

## 2019-02-17 NOTE — Progress Notes (Signed)
Kimberly Jackson is a 73 y.o. female who presents to [facility]: [place] today for follow-up hypertension, carpal tunnel syndrome.  Carpal tunnel syndrome: Seen about 3 months ago for this.  She has bothersome bilateral symptoms left worse than right.  Symptoms were waking her from sleep and not responding well to night splinting.  In the past she had a remote history of a nerve study however medical records are not available.  At the last visit we discussed her options.  She reacted very poorly to the systemic steroids previously with a suicide attempt and plan to treat more conservatively with trial of gabapentin.  In the interim she notes that her symptoms persist.  Gabapentin was not helpful at all.  Her symptoms are obnoxious and interferes with her quality of life.  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  She denies significant weakness.    Hypertension: Managed with losartan, and metoprolol.  Doing well with no issues.  No chest pain palpitation shortness of breath lightheadedness or dizziness.   ROS as above:  Exam:  BP 134/60   Pulse (!) 56   Temp 98.2 F (36.8 C) (Oral)   Wt 187 lb (84.8 kg)   BMI 33.55 kg/m  Wt Readings from Last 5 Encounters:  02/17/19 187 lb (84.8 kg)  12/03/18 191 lb (86.6 kg)  09/03/18 184 lb (83.5 kg)  08/06/18 183 lb (83 kg)  03/13/18 174 lb (78.9 kg)    Gen: Well NAD HEENT: EOMI,  MMM Lungs: Normal work of breathing. CTABL Heart: RRR no MRG Abd: NABS, Soft. Nondistended, Nontender Exts: Brisk capillary refill, warm and well perfused.  MSK: Hands bilaterally normal-appearing with no significant atrophy.  Normal hand motion and strength.  Pulses cap refill and sensation are intact.  Positive Tinel's and Phalen's test bilateral carpal tunnel.    Lab and Radiology Results No results found for this or any previous visit (from the past 72 hour(s)). No results found.    Assessment and Plan: 74 y.o. female with  Carpal tunnel syndrome bilaterally.  Failing conservative management.  Patient has serious life through the skin side effect/adverse event with steroids used in the past.  Will avoid injections therefore for carpal tunnel syndrome.  Plan for surgical planning.  Plan for nerve conduction study/EMG bilateral upper extremities for staging carpal tunnel syndrome.  Additionally were going to refer to orthopedic/hand therapy now as I am hopeful she will have an appointment scheduled a few weeks after her nerve conduction study is scheduled so the results will be back and available for surgeon.  Hypertension: Well managed continue current regimen.  Check basic labs to reassess hypertension and other chronic health issues.  Schedule nurse visit mid-September for egg free flu vaccine.  Recheck with me in 6 months or so for hypertension follow-up visit.  Return sooner if needed.  PDMP not reviewed this encounter. Orders Placed This Encounter  Procedures  . CBC with Differential/Platelet  . COMPLETE METABOLIC PANEL WITH GFR  . Ambulatory referral to Orthopedic Surgery    Referral Priority:   Routine    Referral Type:   Surgical    Referral Reason:   Specialty Services Required    Referred to Provider:   Roseanne Kaufman, MD    Requested Specialty:   Orthopedic Surgery    Number of Visits Requested:   1  .  NCV with EMG(electromyography)    Standing Status:   Future    Standing Expiration Date:   02/17/2020    Scheduling Instructions:     Nerve conduction study BL carp tunnel    Order Specific Question:   Where should this test be performed?    Answer:   [facility]   Meds ordered this encounter  Medications  . diclofenac sodium (VOLTAREN) 1 % GEL    Sig: Apply 4 g topically 4 (four) times daily.    Dispense:  600 g    Refill:  2     Disp 3 month supply for use 4x daily     Historical information moved to improve visibility of documentation.  Past Medical History:  Diagnosis Date  . Alcohol abuse   . COPD (chronic obstructive pulmonary disease) ([place])   . Depression   . Reflux    Past Surgical History:  Procedure Laterality Date  . ABDOMINAL HYSTERECTOMY    . APPENDECTOMY     73 yo  . BLADDER SURGERY  2015  . CHOLECYSTECTOMY    . REPLACEMENT TOTAL KNEE    . ROTATOR CUFF REPAIR    . TONSILLECTOMY     Social History   Tobacco Use  . Smoking status: Never Smoker  . Smokeless tobacco: Never Used  Substance Use Topics  . Alcohol use: No    Alcohol/week: 0.0 standard drinks   family history includes Alcohol abuse in her mother.  Medications: Current Outpatient Medications  Medication Sig Dispense Refill  . ADVAIR HFA 230-21 MCG/ACT inhaler INHALE 2 PUFFS INTO THE LUNGS TWICE DAILY 12 g 1  . AMBULATORY NON FORMULARY MEDICATION Blood pressure monitor use daily for hypertension. 1 Device 0  . Cholecalciferol (VITAMIN D3) 5000 units CAPS Take 5,000 Units by mouth daily.    . diclofenac sodium (VOLTAREN) 1 % GEL Apply 4 g topically 4 (four) times daily. 600 g 2  . digoxin (LANOXIN) 0.125 MG tablet TAKE 1 TABLET BY MOUTH EVERY DAY 90 tablet 3  . fluticasone (FLONASE) 50 MCG/ACT nasal spray SPRAY 1 SPRAY IN EACH NOSTRILS BID  0  . Ipratropium-Albuterol (COMBIVENT) 20-100 MCG/ACT AERS respimat Inhale 1 puff into the lungs every 6 (six) hours as needed for wheezing or shortness of breath. 1 Inhaler 5  . ipratropium-albuterol (DUONEB) 0.5-2.5 (3) MG/3ML SOLN Use 1 every 6 hours as needed for wheezing 360 mL 1  . losartan (COZAAR) 100 MG tablet TAKE 1 TABLET BY MOUTH EVERY DAY 90 tablet 1  . metoprolol succinate (TOPROL-XL) 100 MG 24 hr tablet TAKE 1 TABLET BY MOUTH EVERY DAY WITH OR IMMEDIATELY FOLLOWING A MEAL 90 tablet 0  . omeprazole (PRILOSEC) 40 MG capsule TAKE 1 CAPSULE(40 MG) BY MOUTH DAILY 90 capsule 3  .  ondansetron (ZOFRAN) 4 MG tablet Take 1 tablet (4 mg total) by mouth every 8 (eight) hours as needed for nausea or vomiting. 20 tablet 0  . tiotropium (SPIRIVA) 18 MCG inhalation capsule Place 1 capsule (18 mcg total) into inhaler and inhale daily. 90 capsule 3  . triamterene-hydrochlorothiazide (MAXZIDE-25) 37.5-25 MG tablet TAKE 1 TABLET BY MOUTH EVERY DAY 90 tablet 0   No current facility-administered medications for this visit.    Allergies  Allergen Reactions  . Corticosteroids Other (See Comments)    Suicidal  . Compazine [Prochlorperazine] Other (See Comments)    Acute dystonic reaction type  . Eggs Or Egg-Derived Products Nausea And Vomiting  . Nickel Rash    skin  . Penicillins  Rash     Discussed warning signs or symptoms. Please see discharge instructions. Patient expresses understanding.

## 2019-02-18 LAB — COMPLETE METABOLIC PANEL WITH GFR
AG Ratio: 1.6 (calc) (ref 1.0–2.5)
ALT: 16 U/L (ref 6–29)
AST: 15 U/L (ref 10–35)
Albumin: 4.2 g/dL (ref 3.6–5.1)
Alkaline phosphatase (APISO): 128 U/L (ref 37–153)
BUN: 14 mg/dL (ref 7–25)
CO2: 28 mmol/L (ref 20–32)
Calcium: 9.5 mg/dL (ref 8.6–10.4)
Chloride: 103 mmol/L (ref 98–110)
Creat: 0.82 mg/dL (ref 0.60–0.93)
GFR, Est African American: 81 mL/min/{1.73_m2} (ref >=60)
GFR, Est Non African American: 70 mL/min/{1.73_m2} (ref >=60)
Globulin: 2.6 g/dL (calc) (ref 1.9–3.7)
Glucose, Bld: 93 mg/dL (ref 65–139)
Potassium: 4 mmol/L (ref 3.5–5.3)
Sodium: 139 mmol/L (ref 135–146)
Total Bilirubin: 0.7 mg/dL (ref 0.2–1.2)
Total Protein: 6.8 g/dL (ref 6.1–8.1)

## 2019-02-18 LAB — CBC WITH DIFFERENTIAL/PLATELET
Absolute Monocytes: 370 cells/uL (ref 200–950)
Basophils Absolute: 22 cells/uL (ref 0–200)
Basophils Relative: 0.4 %
Eosinophils Absolute: 78 cells/uL (ref 15–500)
Eosinophils Relative: 1.4 %
HCT: 37.9 % (ref 35.0–45.0)
Hemoglobin: 12.8 g/dL (ref 11.7–15.5)
Lymphs Abs: 1602 cells/uL (ref 850–3900)
MCH: 30.5 pg (ref 27.0–33.0)
MCHC: 33.8 g/dL (ref 32.0–36.0)
MCV: 90.2 fL (ref 80.0–100.0)
MPV: 10.5 fL (ref 7.5–12.5)
Monocytes Relative: 6.6 %
Neutro Abs: 3528 cells/uL (ref 1500–7800)
Neutrophils Relative %: 63 %
Platelets: 298 10*3/uL (ref 140–400)
RBC: 4.2 10*6/uL (ref 3.80–5.10)
RDW: 13 % (ref 11.0–15.0)
Total Lymphocyte: 28.6 %
WBC: 5.6 10*3/uL (ref 3.8–10.8)

## 2019-02-19 ENCOUNTER — Telehealth: Payer: Self-pay | Admitting: *Deleted

## 2019-02-19 NOTE — Telephone Encounter (Signed)
Patient will wait for Dr Georgina Snell to return.

## 2019-02-19 NOTE — Telephone Encounter (Signed)
We still have to do an assessment.  More than happy to schedule a virtual visit for her this morning with 1 of our providers to make sure that the treatment is appropriate and that we are choosing the right antibiotic.

## 2019-02-19 NOTE — Telephone Encounter (Signed)
Pt left vm this morning wanting to know if an abx could be sent in for her for sinus issues.  She stated that Dr. Georgina Snell has told her in the past that once she sees yellowish mucous to call for meds due to her "lung issue".  Please advise.

## 2019-02-19 NOTE — Telephone Encounter (Signed)
Left message for patient to call and schedule a virtual visit.

## 2019-02-23 MED ORDER — CEFDINIR 300 MG PO CAPS: 300.0000 mg | ORAL_CAPSULE | Freq: Two times a day (BID) | ORAL | 0 refills | Status: DC

## 2019-02-23 NOTE — Telephone Encounter (Signed)
Left message advising patient

## 2019-02-23 NOTE — Telephone Encounter (Signed)
Sent in [place] antibiotic trial for possible sinusitis.  Sent to [facility] in [place].

## 2019-02-25 ENCOUNTER — Other Ambulatory Visit: Payer: Self-pay | Admitting: Family Medicine

## 2019-03-05 ENCOUNTER — Other Ambulatory Visit: Payer: Medicare Other

## 2019-03-05 ENCOUNTER — Ambulatory Visit: Payer: Medicare Other | Admitting: Family Medicine

## 2019-03-06 ENCOUNTER — Other Ambulatory Visit: Payer: Self-pay | Admitting: Family Medicine

## 2019-03-11 ENCOUNTER — Other Ambulatory Visit: Payer: Self-pay

## 2019-03-11 ENCOUNTER — Ambulatory Visit (INDEPENDENT_AMBULATORY_CARE_PROVIDER_SITE_OTHER): Payer: Medicare Other

## 2019-03-11 DIAGNOSIS — R918 Other nonspecific abnormal finding of lung field: Secondary | ICD-10-CM | POA: Diagnosis not present

## 2019-03-11 DIAGNOSIS — K449 Diaphragmatic hernia without obstruction or gangrene: Secondary | ICD-10-CM | POA: Diagnosis not present

## 2019-03-11 DIAGNOSIS — I700 Atherosclerosis of aorta: Secondary | ICD-10-CM | POA: Diagnosis not present

## 2019-03-15 ENCOUNTER — Telehealth: Payer: Self-pay | Admitting: Pulmonary Disease

## 2019-03-15 NOTE — Telephone Encounter (Signed)
Notes recorded by Juanito Doom, MD on 03/12/2019 at 6:37 PM EDT  C,  Please let the patient know this was OK  Thanks,  B   LMTCB

## 2019-03-16 NOTE — Telephone Encounter (Signed)
Called and spoke to pt. Informed her the results per BQ. Pt verbalized understanding and states she recently completed an abx (cefdinir) about a week ago. Pt states she is feeling much better and her mucus is now clear instead of yellow and brown. Advised pt to call back if she begins to feel worse or her mucus becomes discolored. Pt verbalized understanding and denied any further questions or concerns at this time.    Will forward to BQ as FYI.

## 2019-03-16 NOTE — Progress Notes (Signed)
See phone note. Results given to pt. Nothing further needed.

## 2019-03-18 DIAGNOSIS — G5603 Carpal tunnel syndrome, bilateral upper limbs: Secondary | ICD-10-CM | POA: Diagnosis not present

## 2019-03-18 DIAGNOSIS — M79642 Pain in left hand: Secondary | ICD-10-CM | POA: Diagnosis not present

## 2019-03-18 DIAGNOSIS — M79641 Pain in right hand: Secondary | ICD-10-CM | POA: Diagnosis not present

## 2019-03-23 ENCOUNTER — Ambulatory Visit (INDEPENDENT_AMBULATORY_CARE_PROVIDER_SITE_OTHER): Payer: Medicare Other | Admitting: Family Medicine

## 2019-03-23 ENCOUNTER — Encounter: Payer: Self-pay | Admitting: Family Medicine

## 2019-03-23 ENCOUNTER — Other Ambulatory Visit: Payer: Self-pay

## 2019-03-23 VITALS — BP 139/66 | HR 64 | Temp 98.2°F | Wt 185.0 lb

## 2019-03-23 DIAGNOSIS — G5602 Carpal tunnel syndrome, left upper limb: Secondary | ICD-10-CM | POA: Diagnosis not present

## 2019-03-23 DIAGNOSIS — R300 Dysuria: Secondary | ICD-10-CM | POA: Diagnosis not present

## 2019-03-23 DIAGNOSIS — H938X1 Other specified disorders of right ear: Secondary | ICD-10-CM | POA: Diagnosis not present

## 2019-03-23 MED ORDER — CEFDINIR 300 MG PO CAPS: 300.0000 mg | ORAL_CAPSULE | Freq: Two times a day (BID) | ORAL | 0 refills | Status: DC

## 2019-03-23 NOTE — Patient Instructions (Addendum)
Thank you for coming in today.  Take the cefnidir (omnicef) twice daily for 1 week.  I will get the urine culture results to you ASAP.  Let me know if not better.   Keep nurse visit for the 15th for egg free flu shot.    I will be moving to full time Sports Medicine in [place] starting on November 1st.  You will still be able to see me for your Sports Medicine or Orthopedic needs at [facility] in [place]. I will still be part of [facility].    If you want to stay locally for your Sports Medicine issues Dr. Dianah Field here in [place] will be happy to see you.  Additionally Dr. Clearance Coots at [facility] will be happy to see you for sports medicine issues more locally.   For your primary care needs you are welcome to establish care with Dr. Emeterio Reeve.  We are working quickly to hire more physicians to cover the primary care needs however if you cannot get an appointment with Dr. Sheppard Coil in a timely manner [place] has locations and openings for primary care services nearby.   [facility] [address] . [place] , [place]: [phone] . Behavioral Medicine: [phone] . Fax: [place] at [employer] [address] . [place], [place]: [phone] . Behavioral Medicine: [phone] . Fax: [phone] . Hours (M-F): 7am - Academic librarian At [facility]. [place], [place]: [phone] . Behavioral Medicine: [phone] . Fax: [phone] . Hours (M-F): 8am - Optician, dispensing at [facility] [employer] . [place], [place] Phone: [phone] . Behavioral Medicine: [phone] . Fax: [phone]

## 2019-03-23 NOTE — Progress Notes (Signed)
Kimberly Jackson is a 73 y.o. female who presents to [place]: [place] today for urinary tract infection and ear pressure.  Patient has a one-week history of urinary frequency urgency dysuria and pressure.  Symptoms are consistent with prior UTIs.  She is had multiple bladder tack surgeries in the past.  She historically did get a lot of urinary tract infections but has had very few red and frequent ones following the surgery.  Additionally she notes some mild right ear pressure.  On August 4 she was prescribed Omnicef antibiotics for presumed right ear infection.  This worked quite well however she notes a little bit of continued ear pressure.  She also notes that she would like a flu vaccine if possible.  She has a allergy but tolerated the egg free flu shot last year quite well.  Lastly: Patient has been seen multiple times for carpal tunnel surgery.  She effectively has failed conservative management and was referred to hand surgery.  She saw Dr. Amedeo Plenty recently who will proceed with surgery when symptoms warrant.  She cannot tolerate steroids given history of suicidal thoughts following oral steroid therefore steroid injections were not performed for her carpal tunnel symptoms.  ROS as above:  Exam:  BP 139/66   Pulse 64   Temp 98.2 F (36.8 C) (Oral)   Wt 185 lb (83.9 kg)   BMI 33.19 kg/m  Wt Readings from Last 5 Encounters:  03/23/19 185 lb (83.9 kg)  02/17/19 187 lb (84.8 kg)  12/03/18 191 lb (86.6 kg)  09/03/18 184 lb (83.5 kg)  08/06/18 183 lb (83 kg)    Gen: Well NAD HEENT: EOMI,  MMM normal tympanic membranes bilaterally. Lungs: Normal work of breathing. CTABL Heart: RRR no MRG Abd: NABS, Soft. Nondistended, Nontender mild CV angle tenderness to percussion left side. Exts: Brisk capillary refill, warm and well perfused.   Lab and Radiology Results Last  creatinine reviewed.  Urine volume insufficient for rapid testing in clinic.  We will proceed to culture.   Assessment and Plan: 73 y.o. female with  Urinary tract infection.  Very likely.  Urine culture pending.  Empiric treatment with Omnicef.  She can tolerate this medicine quite well.  Recheck if not improving.  Ear pressure: Unclear etiology watchful waiting.  Carpal tunnel: Agree with surgery when warranted.  Appreciate Dr. Vanetta Shawl care.  Flu vaccine.  We do not currently have the egg free flu vaccine.  Patient is scheduled for nurse visit later this month for flu shot.  We will make sure we have it by then.  PDMP not reviewed this encounter. Orders Placed This Encounter  Procedures  . Urine Culture   Meds ordered this encounter  Medications  . cefdinir (OMNICEF) 300 MG capsule    Sig: Take 1 capsule (300 mg total) by mouth 2 (two) times daily.    Dispense:  14 capsule    Refill:  0     Historical information moved to improve visibility of documentation.  Past Medical History:  Diagnosis Date  . Alcohol abuse   . COPD (chronic obstructive pulmonary disease) ([place])   . Depression   . Reflux    Past Surgical History:  Procedure Laterality Date  . ABDOMINAL HYSTERECTOMY    . APPENDECTOMY     73 yo  . BLADDER SURGERY  2015  . CHOLECYSTECTOMY    . REPLACEMENT TOTAL KNEE    . ROTATOR CUFF REPAIR    .  TONSILLECTOMY     Social History   Tobacco Use  . Smoking status: Never Smoker  . Smokeless tobacco: Never Used  Substance Use Topics  . Alcohol use: No    Alcohol/week: 0.0 standard drinks   family history includes Alcohol abuse in her mother.  Medications: Current Outpatient Medications  Medication Sig Dispense Refill  . ADVAIR HFA 230-21 MCG/ACT inhaler INHALE 2 PUFFS INTO THE LUNGS TWICE DAILY 12 g 1  . AMBULATORY NON FORMULARY MEDICATION Blood pressure monitor use daily for hypertension. 1 Device 0  . cefdinir (OMNICEF) 300 MG capsule Take 1 capsule (300  mg total) by mouth 2 (two) times daily. 14 capsule 0  . Cholecalciferol (VITAMIN D3) 5000 units CAPS Take 5,000 Units by mouth daily.    . diclofenac sodium (VOLTAREN) 1 % GEL Apply 4 g topically 4 (four) times daily. 600 g 2  . digoxin (LANOXIN) 0.125 MG tablet TAKE 1 TABLET BY MOUTH EVERY DAY 90 tablet 3  . fluticasone (FLONASE) 50 MCG/ACT nasal spray SPRAY 1 SPRAY IN EACH NOSTRILS BID  0  . Ipratropium-Albuterol (COMBIVENT) 20-100 MCG/ACT AERS respimat Inhale 1 puff into the lungs every 6 (six) hours as needed for wheezing or shortness of breath. 1 Inhaler 5  . ipratropium-albuterol (DUONEB) 0.5-2.5 (3) MG/3ML SOLN Use 1 every 6 hours as needed for wheezing 360 mL 1  . losartan (COZAAR) 100 MG tablet TAKE 1 TABLET BY MOUTH EVERY DAY 90 tablet 1  . metoprolol succinate (TOPROL-XL) 100 MG 24 hr tablet TAKE 1 TABLET BY MOUTH EVERY DAY WITH OR IMMEDIATELY FOLLOWING A MEAL 90 tablet 0  . omeprazole (PRILOSEC) 40 MG capsule TAKE 1 CAPSULE(40 MG) BY MOUTH DAILY 90 capsule 3  . ondansetron (ZOFRAN) 4 MG tablet Take 1 tablet (4 mg total) by mouth every 8 (eight) hours as needed for nausea or vomiting. 20 tablet 0  . tiotropium (SPIRIVA) 18 MCG inhalation capsule Place 1 capsule (18 mcg total) into inhaler and inhale daily. 90 capsule 3  . triamterene-hydrochlorothiazide (MAXZIDE-25) 37.5-25 MG tablet TAKE 1 TABLET BY MOUTH EVERY DAY 90 tablet 0   No current facility-administered medications for this visit.    Allergies  Allergen Reactions  . Corticosteroids Other (See Comments)    Suicidal  . Compazine [Prochlorperazine] Other (See Comments)    Acute dystonic reaction type  . Eggs Or Egg-Derived Products Nausea And Vomiting  . Nickel Rash    skin  . Penicillins Rash     Discussed warning signs or symptoms. Please see discharge instructions. Patient expresses understanding.

## 2019-03-25 LAB — URINE CULTURE
MICRO NUMBER:: 837445
SPECIMEN QUALITY:: ADEQUATE

## 2019-04-06 ENCOUNTER — Ambulatory Visit (INDEPENDENT_AMBULATORY_CARE_PROVIDER_SITE_OTHER): Payer: Medicare Other | Admitting: Family Medicine

## 2019-04-06 VITALS — Temp 98.3°F

## 2019-04-06 DIAGNOSIS — Z23 Encounter for immunization: Secondary | ICD-10-CM | POA: Diagnosis not present

## 2019-04-06 NOTE — Progress Notes (Signed)
Pt came into clinic today for egg free flu shot. Screening questions answered appropriately. Tolerated injection in right deltoid well, no immediate complications. Advised to contact clinic with any questions or concerns.

## 2019-05-14 ENCOUNTER — Other Ambulatory Visit: Payer: Self-pay | Admitting: Family Medicine

## 2019-05-26 ENCOUNTER — Other Ambulatory Visit: Payer: Self-pay | Admitting: Family Medicine

## 2019-08-19 ENCOUNTER — Other Ambulatory Visit: Payer: Self-pay | Admitting: *Deleted

## 2019-08-19 MED ORDER — LOSARTAN POTASSIUM 100 MG PO TABS: 100.0000 mg | ORAL_TABLET | Freq: Every day | ORAL | 0 refills | Status: DC

## 2019-08-23 ENCOUNTER — Other Ambulatory Visit: Payer: Self-pay | Admitting: Neurology

## 2019-08-23 ENCOUNTER — Other Ambulatory Visit: Payer: Self-pay | Admitting: Physician Assistant

## 2019-08-23 MED ORDER — TRIAMTERENE-HCTZ 37.5-25 MG PO TABS: 1.0000 | ORAL_TABLET | Freq: Every day | ORAL | 0 refills | Status: DC

## 2019-08-30 ENCOUNTER — Other Ambulatory Visit: Payer: Self-pay | Admitting: *Deleted

## 2019-08-30 MED ORDER — METOPROLOL SUCCINATE ER 100 MG PO TB24: ORAL_TABLET | ORAL | 0 refills | Status: DC

## 2019-08-31 ENCOUNTER — Other Ambulatory Visit: Payer: Self-pay | Admitting: *Deleted

## 2019-09-21 ENCOUNTER — Ambulatory Visit: Payer: Medicare Other | Admitting: Osteopathic Medicine

## 2019-09-23 ENCOUNTER — Other Ambulatory Visit: Payer: Self-pay

## 2019-09-23 ENCOUNTER — Ambulatory Visit (INDEPENDENT_AMBULATORY_CARE_PROVIDER_SITE_OTHER): Payer: Medicare Other | Admitting: Family Medicine

## 2019-09-23 ENCOUNTER — Encounter: Payer: Self-pay | Admitting: Family Medicine

## 2019-09-23 VITALS — BP 136/66 | HR 61 | Temp 98.6°F | Ht 64.0 in | Wt 197.0 lb

## 2019-09-23 DIAGNOSIS — I480 Paroxysmal atrial fibrillation: Secondary | ICD-10-CM

## 2019-09-23 DIAGNOSIS — R7303 Prediabetes: Secondary | ICD-10-CM

## 2019-09-23 DIAGNOSIS — Z13220 Encounter for screening for lipoid disorders: Secondary | ICD-10-CM

## 2019-09-23 DIAGNOSIS — I10 Essential (primary) hypertension: Secondary | ICD-10-CM | POA: Diagnosis not present

## 2019-09-23 DIAGNOSIS — R252 Cramp and spasm: Secondary | ICD-10-CM

## 2019-09-23 DIAGNOSIS — J449 Chronic obstructive pulmonary disease, unspecified: Secondary | ICD-10-CM

## 2019-09-23 DIAGNOSIS — E7800 Pure hypercholesterolemia, unspecified: Secondary | ICD-10-CM | POA: Diagnosis not present

## 2019-09-23 LAB — LIPID PANEL
Cholesterol: 210 mg/dL — ABNORMAL HIGH (ref <200)
HDL: 56 mg/dL (ref >=50)
LDL Cholesterol (Calc): 125 mg/dL (calc) — ABNORMAL HIGH
Non-HDL Cholesterol (Calc): 154 mg/dL (calc) — ABNORMAL HIGH (ref <130)
Total CHOL/HDL Ratio: 3.8 (calc) (ref <5.0)
Triglycerides: 173 mg/dL — ABNORMAL HIGH (ref <150)

## 2019-09-23 MED ORDER — TRIAMTERENE-HCTZ 37.5-25 MG PO TABS: 1.0000 | ORAL_TABLET | Freq: Every day | ORAL | 2 refills | Status: DC

## 2019-09-23 MED ORDER — TIOTROPIUM BROMIDE MONOHYDRATE 18 MCG IN CAPS: 18.0000 ug | ORAL_CAPSULE | Freq: Every day | RESPIRATORY_TRACT | 3 refills | Status: DC

## 2019-09-23 MED ORDER — METOPROLOL SUCCINATE ER 100 MG PO TB24: ORAL_TABLET | ORAL | 1 refills | Status: DC

## 2019-09-23 MED ORDER — IPRATROPIUM-ALBUTEROL 20-100 MCG/ACT IN AERS: 1.0000 | INHALATION_SPRAY | Freq: Four times a day (QID) | RESPIRATORY_TRACT | 5 refills | Status: DC | PRN

## 2019-09-23 NOTE — Patient Instructions (Signed)
Great to meet you today! Please continue current medications. We'll be in touch with lab results.  I would like to see you back in about 6 months.   COVID-19 Vaccine Information can be found at: [email or web address] For questions related to vaccine distribution or appointments, please email [email or web address] or call [phone].

## 2019-09-24 LAB — COMPLETE METABOLIC PANEL WITH GFR
AG Ratio: 1.7 (calc) (ref 1.0–2.5)
ALT: 24 U/L (ref 6–29)
AST: 22 U/L (ref 10–35)
Albumin: 4.3 g/dL (ref 3.6–5.1)
Alkaline phosphatase (APISO): 122 U/L (ref 37–153)
BUN: 19 mg/dL (ref 7–25)
CO2: 28 mmol/L (ref 20–32)
Calcium: 9.3 mg/dL (ref 8.6–10.4)
Chloride: 101 mmol/L (ref 98–110)
Creat: 0.86 mg/dL (ref 0.60–0.93)
GFR, Est African American: 77 mL/min/{1.73_m2} (ref >=60)
GFR, Est Non African American: 66 mL/min/{1.73_m2} (ref >=60)
Globulin: 2.6 g/dL (calc) (ref 1.9–3.7)
Glucose, Bld: 97 mg/dL (ref 65–99)
Potassium: 4.6 mmol/L (ref 3.5–5.3)
Sodium: 138 mmol/L (ref 135–146)
Total Bilirubin: 0.6 mg/dL (ref 0.2–1.2)
Total Protein: 6.9 g/dL (ref 6.1–8.1)

## 2019-09-24 LAB — HEMOGLOBIN A1C
Hgb A1c MFr Bld: 5.5 % of total Hgb (ref <5.7)
Mean Plasma Glucose: 111 (calc)
eAG (mmol/L): 6.2 (calc)

## 2019-09-24 LAB — MAGNESIUM: Magnesium: 1.7 mg/dL (ref 1.5–2.5)

## 2019-09-24 LAB — DIGOXIN LEVEL: Digoxin Level: 0.6 mcg/L — ABNORMAL LOW (ref 0.8–2.0)

## 2019-09-26 ENCOUNTER — Encounter: Payer: Self-pay | Admitting: Family Medicine

## 2019-09-26 DIAGNOSIS — R252 Cramp and spasm: Secondary | ICD-10-CM | POA: Insufficient documentation

## 2019-09-26 NOTE — Assessment & Plan Note (Signed)
Stable with current inhalers. Recommend continuation of advair and spiriva daily with combivent as needed

## 2019-09-26 NOTE — Assessment & Plan Note (Signed)
CHA2DS2-VASC score of 4, not currently on anticoagulation however she has remained in regular rhythm with digoxin and toprol.   Continue at current dose. Update digoxin level

## 2019-09-26 NOTE — Assessment & Plan Note (Signed)
Update a1c today . 

## 2019-09-26 NOTE — Assessment & Plan Note (Signed)
Blood pressure is at goal at for age and co-morbidities.  I recommend she continue current medications.  In addition they were instructed to follow a low sodium diet with regular exercise to help to maintain adequate control of blood pressure.   

## 2019-09-26 NOTE — Assessment & Plan Note (Signed)
Check Magnesium, potassium and blood sugars.

## 2019-09-26 NOTE — Progress Notes (Signed)
Kimberly Jackson - 74 y.o. female MRN [id number]  Date of birth: 10-Nov-1945  Subjective Chief Complaint  Patient presents with  . Hypertension  . Atrial Fibrillation    HPI Kimberly Jackson is a 74 y.o. female with history of HTN, PAF, COPD and prediabetes here today for follow up visit.   -HTN/PAF:  History of HTN and PAF, current treatment with losartan, toprol-xl, maxzide and digoxin.  She has maintained good control of BP and heart rhythm with current medications.  She denies anginal symptoms, palpitations, shortness of breath, dizziness or fatigue.  She has noted some cramping in her legs.  She has history of low magnesium.   -COPD:  Using advair and spiriva daily.  Has combivent as needed.  Symptoms are well controlled at this time.  She denies increased wheezing or shortness of breath.   ROS:  A comprehensive ROS was completed and negative except as noted per HPI    Allergies  Allergen Reactions  . Corticosteroids Other (See Comments)    Suicidal  . Compazine [Prochlorperazine] Other (See Comments)    Acute dystonic reaction type  . Eggs Or Egg-Derived Products Nausea And Vomiting  . Nickel Rash    skin  . Penicillins Rash    Past Medical History:  Diagnosis Date  . Alcohol abuse   . COPD (chronic obstructive pulmonary disease) ([place])   . Depression   . Reflux     Past Surgical History:  Procedure Laterality Date  . ABDOMINAL HYSTERECTOMY    . APPENDECTOMY     74 yo  . BLADDER SURGERY  2015  . CHOLECYSTECTOMY    . REPLACEMENT TOTAL KNEE    . ROTATOR CUFF REPAIR    . TONSILLECTOMY      Social History   Socioeconomic History  . Marital status: Married    Spouse name: Not on file  . Number of children: Not on file  . Years of education: Not on file  . Highest education level: Not on file  Occupational History  . Not on file  Tobacco Use  . Smoking status: Never Smoker  . Smokeless tobacco: Never Used  Substance and Sexual Activity  . Alcohol use: No     Alcohol/week: 0.0 standard drinks  . Drug use: No  . Sexual activity: Not on file  Other Topics Concern  . Not on file  Social History Narrative  . Not on file   Social Determinants of Health   Financial Resource Strain:   . Difficulty of Paying Living Expenses: Not on file  Food Insecurity:   . Worried About Charity fundraiser in the Last Year: Not on file  . Ran Out of Food in the Last Year: Not on file  Transportation Needs:   . Lack of Transportation (Medical): Not on file  . Lack of Transportation (Non-Medical): Not on file  Physical Activity:   . Days of Exercise per Week: Not on file  . Minutes of Exercise per Session: Not on file  Stress:   . Feeling of Stress : Not on file  Social Connections:   . Frequency of Communication with Friends and Family: Not on file  . Frequency of Social Gatherings with Friends and Family: Not on file  . Attends Religious Services: Not on file  . Active Member of Clubs or Organizations: Not on file  . Attends Archivist Meetings: Not on file  . Marital Status: Not on file    Family History  Problem  Relation Age of Onset  . Alcohol abuse Mother     Health Maintenance  Topic Date Due  . COLONOSCOPY  09/16/2022  . TETANUS/TDAP  11/19/2024  . INFLUENZA VACCINE  Completed  . DEXA SCAN  Completed  . Hepatitis C Screening  Completed  . PNA vac Low Risk Adult  Completed     ----------------------------------------------------------------------------------------------------------------------------------------------------------------------------------------------------------------- Physical Exam BP 136/66   Pulse 61   Temp 98.6 F (37 C) (Oral)   Ht 5\' 4"  (1.626 m)   Wt 197 lb (89.4 kg)   BMI 33.81 kg/m   Physical Exam Constitutional:      Appearance: Normal appearance.  HENT:     Head: Normocephalic and atraumatic.  Eyes:     General: No scleral icterus. Cardiovascular:     Rate and Rhythm: Normal rate and  regular rhythm.  Pulmonary:     Effort: Pulmonary effort is normal.     Breath sounds: Normal breath sounds.  Musculoskeletal:     Cervical back: Neck supple.  Skin:    General: Skin is warm and dry.  Neurological:     General: No focal deficit present.     Mental Status: She is alert.  Psychiatric:        Mood and Affect: Mood normal.        Behavior: Behavior normal.     ------------------------------------------------------------------------------------------------------------------------------------------------------------------------------------------------------------------- Assessment and Plan  Paroxysmal a-fib (HCC) CHA2DS2-VASC score of 4, not currently on anticoagulation however she has remained in regular rhythm with digoxin and toprol.   Continue at current dose. Update digoxin level   HTN (hypertension) Blood pressure is at goal at for age and co-morbidities.  I recommend she continue current medications.  In addition they were instructed to follow a low sodium diet with regular exercise to help to maintain adequate control of blood pressure.    COPD (chronic obstructive pulmonary disease) (HCC) Stable with current inhalers. Recommend continuation of advair and spiriva daily with combivent as needed  Leg cramps Check Magnesium, potassium and blood sugars.    Prediabetes Update a1c today.    Meds ordered this encounter  Medications  . metoprolol succinate (TOPROL-XL) 100 MG 24 hr tablet    Sig: Take 1 tablet by mouth every day.    Dispense:  90 tablet    Refill:  1  . Ipratropium-Albuterol (COMBIVENT) 20-100 MCG/ACT AERS respimat    Sig: Inhale 1 puff into the lungs every 6 (six) hours as needed for wheezing or shortness of breath.    Dispense:  4 g    Refill:  5  . tiotropium (SPIRIVA) 18 MCG inhalation capsule    Sig: Place 1 capsule (18 mcg total) into inhaler and inhale daily.    Dispense:  90 capsule    Refill:  3  .  triamterene-hydrochlorothiazide (MAXZIDE-25) 37.5-25 MG tablet    Sig: Take 1 tablet by mouth daily. NEEDS APPT/LABS    Dispense:  90 tablet    Refill:  2    Return in about 6 months (around 03/25/2020) for HTN/COPD.    This visit occurred during the SARS-CoV-2 public health emergency.  Safety protocols were in place, including screening questions prior to the visit, additional usage of staff PPE, and extensive cleaning of exam room while observing appropriate contact time as indicated for disinfecting solutions.

## 2019-12-01 ENCOUNTER — Other Ambulatory Visit: Payer: Self-pay | Admitting: Sports Medicine

## 2019-12-01 ENCOUNTER — Other Ambulatory Visit: Payer: Self-pay

## 2019-12-01 NOTE — Telephone Encounter (Signed)
To PCP

## 2019-12-22 ENCOUNTER — Other Ambulatory Visit: Payer: Self-pay | Admitting: Family Medicine

## 2019-12-22 NOTE — Telephone Encounter (Signed)
This medication has not been prescribed since 01/2019.  Original prescriber was Dr. Georgina Snell.  Please review for appropriateness of refill request.  Charyl Bigger, CMA

## 2020-02-14 ENCOUNTER — Other Ambulatory Visit: Payer: Self-pay | Admitting: Family Medicine

## 2020-02-17 DIAGNOSIS — H43813 Vitreous degeneration, bilateral: Secondary | ICD-10-CM | POA: Diagnosis not present

## 2020-02-17 DIAGNOSIS — H5053 Vertical heterophoria: Secondary | ICD-10-CM | POA: Diagnosis not present

## 2020-02-17 DIAGNOSIS — H02831 Dermatochalasis of right upper eyelid: Secondary | ICD-10-CM | POA: Diagnosis not present

## 2020-02-17 DIAGNOSIS — H527 Unspecified disorder of refraction: Secondary | ICD-10-CM | POA: Diagnosis not present

## 2020-02-17 DIAGNOSIS — H0100B Unspecified blepharitis left eye, upper and lower eyelids: Secondary | ICD-10-CM | POA: Diagnosis not present

## 2020-02-17 DIAGNOSIS — H18513 Endothelial corneal dystrophy, bilateral: Secondary | ICD-10-CM | POA: Diagnosis not present

## 2020-02-17 DIAGNOSIS — H02834 Dermatochalasis of left upper eyelid: Secondary | ICD-10-CM | POA: Diagnosis not present

## 2020-02-17 DIAGNOSIS — H25813 Combined forms of age-related cataract, bilateral: Secondary | ICD-10-CM | POA: Diagnosis not present

## 2020-02-17 DIAGNOSIS — H02824 Cysts of left upper eyelid: Secondary | ICD-10-CM | POA: Diagnosis not present

## 2020-02-17 DIAGNOSIS — H0100A Unspecified blepharitis right eye, upper and lower eyelids: Secondary | ICD-10-CM | POA: Diagnosis not present

## 2020-03-25 ENCOUNTER — Other Ambulatory Visit: Payer: Self-pay | Admitting: Family Medicine

## 2020-03-28 ENCOUNTER — Ambulatory Visit: Payer: Medicare Other | Admitting: Family Medicine

## 2020-04-07 ENCOUNTER — Ambulatory Visit: Payer: Medicare Other | Admitting: Family Medicine

## 2020-04-12 ENCOUNTER — Ambulatory Visit (INDEPENDENT_AMBULATORY_CARE_PROVIDER_SITE_OTHER): Payer: Medicare Other | Admitting: Family Medicine

## 2020-04-12 ENCOUNTER — Other Ambulatory Visit: Payer: Self-pay

## 2020-04-12 ENCOUNTER — Encounter: Payer: Self-pay | Admitting: Family Medicine

## 2020-04-12 VITALS — BP 130/76 | HR 63 | Temp 97.5°F | Wt 194.1 lb

## 2020-04-12 DIAGNOSIS — I480 Paroxysmal atrial fibrillation: Secondary | ICD-10-CM | POA: Diagnosis not present

## 2020-04-12 DIAGNOSIS — M8949 Other hypertrophic osteoarthropathy, multiple sites: Secondary | ICD-10-CM

## 2020-04-12 DIAGNOSIS — Z1231 Encounter for screening mammogram for malignant neoplasm of breast: Secondary | ICD-10-CM

## 2020-04-12 DIAGNOSIS — J449 Chronic obstructive pulmonary disease, unspecified: Secondary | ICD-10-CM

## 2020-04-12 DIAGNOSIS — Z23 Encounter for immunization: Secondary | ICD-10-CM | POA: Diagnosis not present

## 2020-04-12 DIAGNOSIS — I10 Essential (primary) hypertension: Secondary | ICD-10-CM

## 2020-04-12 DIAGNOSIS — R252 Cramp and spasm: Secondary | ICD-10-CM | POA: Diagnosis not present

## 2020-04-12 DIAGNOSIS — M159 Polyosteoarthritis, unspecified: Secondary | ICD-10-CM

## 2020-04-12 MED ORDER — DICLOFENAC SODIUM 1 % EX GEL
CUTANEOUS | 3 refills | Status: DC
Start: 2020-04-12 — End: 2021-05-29

## 2020-04-12 NOTE — Patient Instructions (Signed)
Great to see you! Have labs completed.  I have entered orders for mammogram.  See me again in 6 months

## 2020-04-13 LAB — BASIC METABOLIC PANEL
BUN/Creatinine Ratio: 16 (calc) (ref 6–22)
BUN: 15 mg/dL (ref 7–25)
CO2: 28 mmol/L (ref 20–32)
Calcium: 9.3 mg/dL (ref 8.6–10.4)
Chloride: 103 mmol/L (ref 98–110)
Creat: 0.94 mg/dL — ABNORMAL HIGH (ref 0.60–0.93)
Glucose, Bld: 101 mg/dL (ref 65–139)
Potassium: 4.5 mmol/L (ref 3.5–5.3)
Sodium: 140 mmol/L (ref 135–146)

## 2020-04-13 LAB — CBC
HCT: 39 % (ref 35.0–45.0)
Hemoglobin: 13.4 g/dL (ref 11.7–15.5)
MCH: 30.8 pg (ref 27.0–33.0)
MCHC: 34.4 g/dL (ref 32.0–36.0)
MCV: 89.7 fL (ref 80.0–100.0)
MPV: 10.1 fL (ref 7.5–12.5)
Platelets: 319 10*3/uL (ref 140–400)
RBC: 4.35 10*6/uL (ref 3.80–5.10)
RDW: 13.1 % (ref 11.0–15.0)
WBC: 7.5 10*3/uL (ref 3.8–10.8)

## 2020-04-13 LAB — MAGNESIUM: Magnesium: 1.7 mg/dL (ref 1.5–2.5)

## 2020-04-14 ENCOUNTER — Ambulatory Visit: Payer: Medicare Other | Admitting: Family Medicine

## 2020-04-16 DIAGNOSIS — M1990 Unspecified osteoarthritis, unspecified site: Secondary | ICD-10-CM | POA: Insufficient documentation

## 2020-04-16 NOTE — Assessment & Plan Note (Signed)
She will try voltaren gel as needed.

## 2020-04-16 NOTE — Assessment & Plan Note (Addendum)
Blood pressure is at goal at for age and co-morbidities.  I recommend continuation of losartan, maxzide and metoprolol.  In addition they were instructed to follow a low sodium diet with regular exercise to help to maintain adequate control of blood pressure.  Update BMP and magnesium today as she is having some cramping.

## 2020-04-16 NOTE — Assessment & Plan Note (Signed)
She remains rhythm controlled with digoxin and rate remains controlled with metoprolol.  She will continue mediation at current strength.

## 2020-04-16 NOTE — Progress Notes (Signed)
Kimberly Jackson - 74 y.o. female MRN [id number]  Date of birth: March 04, 1946  Subjective No chief complaint on file.   HPI Kimberly Jackson is a 74 y.o. female here today for follow up visit.  She has a history of PAF, HTN, COPD, and GERD.  She reports that overall she is doing ok.    BP has remained well controlled with losaratan, maxzide and metoprolol.  Her A. Fib has remains rhythm controlled with digoxin.  She denies recent episodes of A. Fib or symptoms including palpitations, dizziness or increased fatigue.  She has had some increased leg cramps, happened when her magnesium was low previously.    COPD is managed by pulmonology.  She continues on daily spiriva and advair.  She also has albuterol as needed.   She is having some joint pain in her hands.  She has tried tylenol which help some.   ROS:  A comprehensive ROS was completed and negative except as noted per HPI  Allergies  Allergen Reactions  . Corticosteroids Other (See Comments)    Suicidal  . Compazine [Prochlorperazine] Other (See Comments)    Acute dystonic reaction type  . Eggs Or Egg-Derived Products Nausea And Vomiting  . Nickel Rash    skin  . Penicillins Rash    Past Medical History:  Diagnosis Date  . Alcohol abuse   . COPD (chronic obstructive pulmonary disease) ([place])   . Depression   . Reflux     Past Surgical History:  Procedure Laterality Date  . ABDOMINAL HYSTERECTOMY    . APPENDECTOMY     74 yo  . BLADDER SURGERY  2015  . CHOLECYSTECTOMY    . REPLACEMENT TOTAL KNEE    . ROTATOR CUFF REPAIR    . TONSILLECTOMY      Social History   Socioeconomic History  . Marital status: Married    Spouse name: Not on file  . Number of children: Not on file  . Years of education: Not on file  . Highest education level: Not on file  Occupational History  . Not on file  Tobacco Use  . Smoking status: Never Smoker  . Smokeless tobacco: Never Used  Vaping Use  . Vaping Use: Never used  Substance and  Sexual Activity  . Alcohol use: No    Alcohol/week: 0.0 standard drinks  . Drug use: No  . Sexual activity: Not on file  Other Topics Concern  . Not on file  Social History Narrative  . Not on file   Social Determinants of Health   Financial Resource Strain:   . Difficulty of Paying Living Expenses: Not on file  Food Insecurity:   . Worried About Charity fundraiser in the Last Year: Not on file  . Ran Out of Food in the Last Year: Not on file  Transportation Needs:   . Lack of Transportation (Medical): Not on file  . Lack of Transportation (Non-Medical): Not on file  Physical Activity:   . Days of Exercise per Week: Not on file  . Minutes of Exercise per Session: Not on file  Stress:   . Feeling of Stress : Not on file  Social Connections:   . Frequency of Communication with Friends and Family: Not on file  . Frequency of Social Gatherings with Friends and Family: Not on file  . Attends Religious Services: Not on file  . Active Member of Clubs or Organizations: Not on file  . Attends Archivist Meetings: Not  on file  . Marital Status: Not on file    Family History  Problem Relation Age of Onset  . Alcohol abuse Mother     Health Maintenance  Topic Date Due  . COLONOSCOPY  09/16/2022  . TETANUS/TDAP  11/19/2024  . INFLUENZA VACCINE  Completed  . DEXA SCAN  Completed  . COVID-19 Vaccine  Completed  . Hepatitis C Screening  Completed  . PNA vac Low Risk Adult  Completed     ----------------------------------------------------------------------------------------------------------------------------------------------------------------------------------------------------------------- Physical Exam BP 130/76 (BP Location: Left Arm, Patient Position: Sitting, Cuff Size: Large)   Pulse 63   Temp (!) 97.5 F (36.4 C)   Wt 194 lb 1.6 oz (88 kg)   SpO2 97%   BMI 33.32 kg/m   Physical Exam Constitutional:      Appearance: Normal appearance.  HENT:      Head: Normocephalic and atraumatic.  Eyes:     General: No scleral icterus. Cardiovascular:     Rate and Rhythm: Normal rate and regular rhythm.  Pulmonary:     Effort: Pulmonary effort is normal.     Breath sounds: Normal breath sounds.  Musculoskeletal:     Cervical back: Neck supple.  Neurological:     General: No focal deficit present.     Mental Status: She is alert.  Psychiatric:        Mood and Affect: Mood normal.        Behavior: Behavior normal.     ------------------------------------------------------------------------------------------------------------------------------------------------------------------------------------------------------------------- Assessment and Plan  Paroxysmal a-fib ([place]) She remains rhythm controlled with digoxin and rate remains controlled with metoprolol.  She will continue mediation at current strength.    HTN (hypertension) Blood pressure is at goal at for age and co-morbidities.  I recommend continuation of losartan, maxzide and metoprolol.  In addition they were instructed to follow a low sodium diet with regular exercise to help to maintain adequate control of blood pressure.  Update BMP and magnesium today as she is having some cramping.     COPD (chronic obstructive pulmonary disease) ([place]) Management per pulmonology.  Stable with current inhalers.    Osteoarthritis She will try voltaren gel as needed.    Meds ordered this encounter  Medications  . diclofenac Sodium (VOLTAREN) 1 % GEL    Sig: APPLY 2 GRAMS TO THE AFFECTED JOINT FOUR TIMES DAILY    Dispense:  600 g    Refill:  3   Orders Placed This Encounter  Procedures  . MM DIGITAL SCREENING BILATERAL    [facility] 05/14/18 [facility]; pt has bilateral lumps and tenderness referred to pcp  Epic order     Standing Status:   Future    Standing Expiration Date:   04/12/2021    Order Specific Question:   Reason for Exam (SYMPTOM  OR DIAGNOSIS REQUIRED)    Answer:    screening mammo    Order Specific Question:   Preferred imaging location?    Answer:   [facility]  . Flu Vaccine MDCK QUAD PF  . Basic Metabolic Panel (BMET)  . CBC  . Magnesium    Return in about 6 months (around 10/10/2020) for HTN.    This visit occurred during the SARS-CoV-2 public health emergency.  Safety protocols were in place, including screening questions prior to the visit, additional usage of staff PPE, and extensive cleaning of exam room while observing appropriate contact time as indicated for disinfecting solutions.

## 2020-04-16 NOTE — Assessment & Plan Note (Signed)
Management per pulmonology.  Stable with current inhalers.

## 2020-05-15 ENCOUNTER — Other Ambulatory Visit: Payer: Self-pay | Admitting: Family Medicine

## 2020-05-15 ENCOUNTER — Telehealth: Payer: Self-pay

## 2020-05-15 DIAGNOSIS — N6310 Unspecified lump in the right breast, unspecified quadrant: Secondary | ICD-10-CM

## 2020-05-15 DIAGNOSIS — R928 Other abnormal and inconclusive findings on diagnostic imaging of breast: Secondary | ICD-10-CM

## 2020-05-15 NOTE — Telephone Encounter (Signed)
Orders entered

## 2020-05-15 NOTE — Telephone Encounter (Signed)
[facility] that you order:  Bilateral dx mammogram Bilateral US due to large lumps in both breasts.

## 2020-05-16 ENCOUNTER — Other Ambulatory Visit: Payer: Self-pay | Admitting: Family Medicine

## 2020-05-16 DIAGNOSIS — N6310 Unspecified lump in the right breast, unspecified quadrant: Secondary | ICD-10-CM

## 2020-05-19 ENCOUNTER — Other Ambulatory Visit: Payer: Self-pay

## 2020-05-19 ENCOUNTER — Ambulatory Visit: Payer: Medicare Other

## 2020-05-19 ENCOUNTER — Ambulatory Visit
Admission: RE | Admit: 2020-05-19 | Discharge: 2020-05-19 | Disposition: A | Discharge status: Home / Self Care | Payer: Medicare Other | Source: Ambulatory Visit | Attending: Family Medicine | Admitting: Family Medicine

## 2020-05-19 DIAGNOSIS — N6310 Unspecified lump in the right breast, unspecified quadrant: Secondary | ICD-10-CM

## 2020-05-19 DIAGNOSIS — N6001 Solitary cyst of right breast: Secondary | ICD-10-CM | POA: Diagnosis not present

## 2020-05-19 DIAGNOSIS — R922 Inconclusive mammogram: Secondary | ICD-10-CM | POA: Diagnosis not present

## 2020-05-19 DIAGNOSIS — N6320 Unspecified lump in the left breast, unspecified quadrant: Secondary | ICD-10-CM

## 2020-05-30 ENCOUNTER — Other Ambulatory Visit: Payer: Self-pay | Admitting: Family Medicine

## 2020-06-05 ENCOUNTER — Other Ambulatory Visit: Payer: Self-pay | Admitting: Family Medicine

## 2020-06-23 ENCOUNTER — Telehealth: Payer: Self-pay

## 2020-06-23 NOTE — Telephone Encounter (Signed)
Pt lvm stating cold symptoms x 1 wk.  She has been told to take an ABX when she develops mucus. Wants an ABX sent to pharmacy.

## 2020-06-23 NOTE — Telephone Encounter (Signed)
Needs evaluation.  Would recommend that she have COVID testing as well

## 2020-06-23 NOTE — Telephone Encounter (Signed)
Patient has been advised to be seen at Urgent Care for possible URI. She stated that Pulmonology told her not to let mucus accumulate. She should take an ABX. Advised her without knowing the root cause of the mucus a proper medication could not be prescribed.   She has agreed to be seen at Urgent Care since we have no availability on schedule for today.

## 2020-06-24 ENCOUNTER — Emergency Department (INDEPENDENT_AMBULATORY_CARE_PROVIDER_SITE_OTHER)
Admission: EM | Admit: 2020-06-24 | Discharge: 2020-06-24 | Disposition: A | Discharge status: Home / Self Care | Payer: Medicare Other | Source: Home / Self Care

## 2020-06-24 ENCOUNTER — Other Ambulatory Visit: Payer: Self-pay

## 2020-06-24 ENCOUNTER — Encounter: Payer: Self-pay | Admitting: Emergency Medicine

## 2020-06-24 DIAGNOSIS — J209 Acute bronchitis, unspecified: Secondary | ICD-10-CM

## 2020-06-24 DIAGNOSIS — J019 Acute sinusitis, unspecified: Secondary | ICD-10-CM

## 2020-06-24 MED ORDER — BENZONATATE 100 MG PO CAPS
100.0000 mg | ORAL_CAPSULE | Freq: Three times a day (TID) | ORAL | 0 refills | Status: DC | PRN
Start: 2020-06-24 — End: 2020-08-16

## 2020-06-24 MED ORDER — PROMETHAZINE-CODEINE 6.25-10 MG/5ML PO SYRP
5.0000 mL | ORAL_SOLUTION | Freq: Every evening | ORAL | 0 refills | Status: DC | PRN
Start: 2020-06-24 — End: 2020-10-11

## 2020-06-24 MED ORDER — CEFDINIR 300 MG PO CAPS
300.0000 mg | ORAL_CAPSULE | Freq: Two times a day (BID) | ORAL | 0 refills | Status: AC
Start: 2020-06-24 — End: 2020-07-01

## 2020-06-24 NOTE — Discharge Instructions (Signed)
Contact our office Monday evening for your test results.   Quarantine until results are known.

## 2020-06-24 NOTE — ED Triage Notes (Signed)
Cough & nasal congestion since the Saturday after Thanksgiving - no one else was sick  Productive cough w/ yellow drainage  Denies fever Decreased appetite  Pt is due for a COVID booster

## 2020-06-24 NOTE — ED Provider Notes (Signed)
[facility]    CSN: [id number] Arrival date & time: 06/24/20  1150      History   Chief Complaint Chief Complaint  Patient presents with  . Cough  . Sore Throat    HPI Kimberly Jackson is a 74 y.o. female.   HPI  Patient with a history of COPD presents today with productive cough, nasal congestion, decreased appetite, fatigue, 5 days.   She denies any chest tightness, wheezing, or  difficulty breathing.  She has not had any wheezing although has Advair which she uses chronically and a rescue inhaler if needed.  She has not required the use of a rescue inhaler.  Denies any known sick contacts.She is fully vaccinated against COVID-19.  Past Medical History:  Diagnosis Date  . Alcohol abuse   . COPD (chronic obstructive pulmonary disease) ([place])   . Depression   . Reflux     Patient Active Problem List   Diagnosis Date Noted  . Osteoarthritis 04/16/2020  . Leg cramps 09/26/2019  . Carpal tunnel syndrome 12/03/2018  . Cataract 04/29/2018  . Magnesium deficiency 10/06/2017  . Low calcium levels 10/06/2017  . Poor venous access 09/30/2017  . Right knee DJD 09/10/2017  . Left shoulder pain 12/09/2016  . OSA and COPD overlap syndrome ([place]) 07/05/2016  . Right foot pain 12/07/2015  . Vitamin D deficiency 09/11/2015  . Prediabetes 09/08/2015  . Osteopenia 09/08/2015  . Ventral hernia 09/08/2015  . COPD (chronic obstructive pulmonary disease) ([place]) 09/04/2015  . Paroxysmal A-fib ([place]) 09/04/2015  . HTN (hypertension) 09/04/2015  . Long-term use of high-risk medication 09/04/2015    Past Surgical History:  Procedure Laterality Date  . ABDOMINAL HYSTERECTOMY    . APPENDECTOMY     74 yo  . BLADDER SURGERY  2015  . CHOLECYSTECTOMY    . REPLACEMENT TOTAL KNEE    . ROTATOR CUFF REPAIR    . TONSILLECTOMY      OB History   No obstetric history on file.      Home Medications    Prior to Admission medications   Medication Sig Start Date End Date  Taking? Authorizing Provider  ADVAIR HFA 903-00 MCG/ACT inhaler INHALE 2 PUFFS INTO THE LUNGS TWICE DAILY 11/23/18  Yes Gregor Hams, MD  Chlorpheniramine-DM (CORICIDIN HBP COUGH/COLD PO) Take by mouth.   Yes [provider]  diclofenac Sodium (VOLTAREN) 1 % GEL APPLY 2 GRAMS TO THE AFFECTED JOINT FOUR TIMES DAILY 04/12/20  Yes Luetta Nutting, DO  digoxin (LANOXIN) 0.125 MG tablet TAKE 1 TABLET BY MOUTH EVERY DAY 05/30/20  Yes Luetta Nutting, DO  Ipratropium-Albuterol (COMBIVENT) 20-100 MCG/ACT AERS respimat Inhale 1 puff into the lungs every 6 (six) hours as needed for wheezing or shortness of breath. 09/23/19  Yes Luetta Nutting, DO  losartan (COZAAR) 100 MG tablet TAKE 1 TABLET(100 MG) BY MOUTH DAILY 06/05/20  Yes Luetta Nutting, DO  metoprolol succinate (TOPROL-XL) 100 MG 24 hr tablet TAKE 1 TABLET BY MOUTH EVERY DAY 03/26/20  Yes Luetta Nutting, DO  omeprazole (PRILOSEC) 40 MG capsule TAKE 1 CAPSULE(40 MG) BY MOUTH DAILY 02/16/20  Yes Luetta Nutting, DO  AMBULATORY NON FORMULARY MEDICATION Blood pressure monitor use daily for hypertension. 02/19/16   Gregor Hams, MD  Cholecalciferol (VITAMIN D3) 5000 units CAPS Take 5,000 Units by mouth daily. Patient not taking: Reported on 06/24/2020    [provider]  fluticasone (FLONASE) 50 MCG/ACT nasal spray SPRAY 1 SPRAY IN EACH NOSTRILS BID Patient not taking:  Reported on 06/24/2020 09/01/15   [provider]  ipratropium-albuterol (DUONEB) 0.5-2.5 (3) MG/3ML SOLN Use 1 every 6 hours as needed for wheezing 10/24/17   Darlyne Russian, MD  ondansetron (ZOFRAN) 4 MG tablet Take 1 tablet (4 mg total) by mouth every 8 (eight) hours as needed for nausea or vomiting. Patient not taking: Reported on 06/24/2020 10/01/17   Gregor Hams, MD  tiotropium [facility]) 18 MCG inhalation capsule Place 1 capsule (18 mcg total) into inhaler and inhale daily. 09/23/19   Luetta Nutting, DO  triamterene-hydrochlorothiazide (MAXZIDE-25) 37.5-25 MG tablet Take 1  tablet by mouth daily. NEEDS APPT/LABS 09/23/19   Luetta Nutting, DO    Family History Family History  Problem Relation Age of Onset  . Alcohol abuse Mother     Social History Social History   Tobacco Use  . Smoking status: Never Smoker  . Smokeless tobacco: Never Used  Vaping Use  . Vaping Use: Never used  Substance Use Topics  . Alcohol use: No    Alcohol/week: 0.0 standard drinks  . Drug use: No     Allergies   Corticosteroids, Compazine [prochlorperazine], Eggs or egg-derived products, Nickel, and Penicillins   Review of Systems Review of Systems Pertinent negatives listed in HPI   Physical Exam Triage Vital Signs ED Triage Vitals  Enc Vitals Group     BP 06/24/20 1257 (!) 168/88     Pulse Rate 06/24/20 1257 76     Resp 06/24/20 1257 18     Temp 06/24/20 1257 99.4 F (37.4 C)     Temp Source 06/24/20 1257 Oral     SpO2 06/24/20 1257 96 %     Weight --      Height --      Head Circumference --      Peak Flow --      Pain Score 06/24/20 1301 3     Pain Loc --      Pain Edu? --      Excl. in [place]? --    No data found.  Updated Vital Signs BP (!) 168/88 Comment: hx of htn  Pulse 76   Temp 99.4 F (37.4 C) (Oral)   Resp 18   SpO2 96%   Visual Acuity Right Eye Distance:   Left Eye Distance:   Bilateral Distance:    Right Eye Near:   Left Eye Near:    Bilateral Near:     Physical Exam General appearance: alert, Ill-appearing, no distress Head: Normocephalic, without obvious abnormality, atraumatic ENT: mucosal edema, congestion, erythematous oropharynx w/o exudate Respiratory: Respirations even , unlabored, coarse lung sound, expiratory wheeze Heart: rate and rhythm normal. No gallop or murmurs noted on exam  Abdomen: BS +, no distention, no rebound tenderness, or no mass Extremities: No gross deformities Skin: Skin color, texture, turgor normal. No rashes seen  Psych: Appropriate mood and affect. Neurologic: Mental status: Alert, oriented to  person, place, and time, thought content appropriate.   UC Treatments / Results  Labs (all labs ordered are listed, but only abnormal results are displayed) Labs Reviewed - No data to display  EKG   Radiology No results found.  Procedures Procedures (including critical care time)  Medications Ordered in UC Medications - No data to display  Initial Impression / Assessment and Plan / UC Course  I have reviewed the triage vital signs and the nursing notes.  Pertinent labs & imaging results that were available during my care of the patient were reviewed  by me and considered in my medical decision making (see chart for details).     Treating for acute sinusitis and acute bronchitis.  See discharge medications below for treatment plan.  COVID-19 test pending given symptoms.  Encourage patient to force fluids.  Red flags discussed indicating need to go immediately to the ER if any breathing issues develop.  Patient verbalized understanding agreement plan.    Final Clinical Impressions(s) / UC Diagnoses   Final diagnoses:  Acute bronchitis, unspecified organism  Acute non-recurrent sinusitis, unspecified location     Discharge Instructions     Contact our office Monday evening for your test results.   Quarantine until results are known.    ED Prescriptions    Medication Sig Dispense Auth. Provider   cefdinir (OMNICEF) 300 MG capsule Take 1 capsule (300 mg total) by mouth 2 (two) times daily for 7 days. 14 capsule Scot Jun, FNP   benzonatate (TESSALON) 100 MG capsule Take 1-2 capsules (100-200 mg total) by mouth 3 (three) times daily as needed for cough. 40 capsule Scot Jun, FNP   promethazine-codeine (PHENERGAN WITH CODEINE) 6.25-10 MG/5ML syrup Take 5 mLs by mouth at bedtime as needed for cough. 50 mL Scot Jun, FNP     PDMP not reviewed this encounter.   Scot Jun, FNP 06/29/20 1311

## 2020-06-26 ENCOUNTER — Other Ambulatory Visit: Payer: Self-pay | Admitting: Family Medicine

## 2020-06-26 NOTE — Telephone Encounter (Signed)
LVM requesting patient callback for follow-up. No visit to Urgent Care found in records.

## 2020-06-27 LAB — COVID-19, FLU A+B AND RSV
Influenza A, NAA: NOT DETECTED
Influenza B, NAA: NOT DETECTED
RSV, NAA: NOT DETECTED
SARS-CoV-2, NAA: NOT DETECTED

## 2020-06-29 ENCOUNTER — Telehealth: Payer: Self-pay

## 2020-06-29 NOTE — Telephone Encounter (Signed)
Kimberly Jackson called and left a message stating she was not feeling well. I called and left a message asking for a return call.

## 2020-06-29 NOTE — Telephone Encounter (Addendum)
Kimberly Jackson states she feels better as the day goes on. She started having fever and wheezing. I did schedule her for a virtual visit in the morning.   Patient also advised   When to [facility] Attention Look for emergency warning signs* for COVID-19. If someone is showing any of these signs, seek emergency medical care immediately . Trouble breathing . Persistent pain or pressure in the chest . New confusion . Inability to wake or stay awake . Bluish lips or face

## 2020-06-30 ENCOUNTER — Encounter: Payer: Self-pay | Admitting: Family Medicine

## 2020-06-30 ENCOUNTER — Telehealth (INDEPENDENT_AMBULATORY_CARE_PROVIDER_SITE_OTHER): Payer: Medicare Other | Admitting: Family Medicine

## 2020-06-30 DIAGNOSIS — J441 Chronic obstructive pulmonary disease with (acute) exacerbation: Secondary | ICD-10-CM | POA: Diagnosis not present

## 2020-06-30 MED ORDER — DOXYCYCLINE HYCLATE 100 MG PO TABS: 100.0000 mg | ORAL_TABLET | Freq: Two times a day (BID) | ORAL | 0 refills | Status: AC

## 2020-06-30 NOTE — Assessment & Plan Note (Signed)
She has improved some but still sounds ill.  Initially was going to add azithromycin, however there is interaction with digoxin.  Will add doxycycline and have her complete course of cefdinir Unfortunately she has allergy to corticosteroids.  Continue current maintenance inhalers and duoneb prn.  Instructed to contact clinic for in person visit if not improving.

## 2020-06-30 NOTE — Progress Notes (Addendum)
Kimberly Jackson - 74 y.o. female MRN [id number]  Date of birth: May 09, 1946   This visit type was conducted due to national recommendations for restrictions regarding the COVID-19 Pandemic (e.g. social distancing).  This format is felt to be most appropriate for this patient at this time.  All issues noted in this document were discussed and addressed.  No physical exam was performed (except for noted visual exam findings with Video Visits).  I discussed the limitations of evaluation and management by telemedicine and the availability of in person appointments. The patient expressed understanding and agreed to proceed.  I connected with@ on 06/30/20 at  8:10 AM EST by a video enabled telemedicine application and verified that I am speaking with the correct person using two identifiers.  Interactive audio and video telecommunications were attempted between this provider and patient, however failed, due to patient having technical difficulties OR patient did not have access to video capability.  We continued and completed visit with audio only.    Present at visit: Luetta Nutting, DO Jonne Ply   Patient Location: [facility] [address] [place] [address]   Provider location:   [place]  No chief complaint on file.   HPI  Kimberly Jackson is a 74 y.o. female who presents via audio/video conferencing for a telehealth visit today.  She is following up today for COPD exacerbation.  She was seen at last week at urgent care and diagnosed with acute bronchitis.  Started on [place], tessalon and promethazine/codeine syrup.  She reports that she did have a fever a couple of days ago but this seems improved now.  She continues with cough and wheezing.  She does continue to use her maintenance inhalers and duoneb as needed.  Her COVID test was negative.      ROS:  A comprehensive ROS was completed and negative except as noted per HPI  Past Medical History:  Diagnosis Date  . Alcohol abuse   .  COPD (chronic obstructive pulmonary disease) ([place])   . Depression   . Reflux     Past Surgical History:  Procedure Laterality Date  . ABDOMINAL HYSTERECTOMY    . APPENDECTOMY     74 yo  . BLADDER SURGERY  2015  . CHOLECYSTECTOMY    . REPLACEMENT TOTAL KNEE    . ROTATOR CUFF REPAIR    . TONSILLECTOMY      Family History  Problem Relation Age of Onset  . Alcohol abuse Mother     Social History   Socioeconomic History  . Marital status: Married    Spouse name: Not on file  . Number of children: Not on file  . Years of education: Not on file  . Highest education level: Not on file  Occupational History  . Not on file  Tobacco Use  . Smoking status: Never Smoker  . Smokeless tobacco: Never Used  Vaping Use  . Vaping Use: Never used  Substance and Sexual Activity  . Alcohol use: No    Alcohol/week: 0.0 standard drinks  . Drug use: No  . Sexual activity: Not on file  Other Topics Concern  . Not on file  Social History Narrative  . Not on file   Social Determinants of Health   Financial Resource Strain: Not on file  Food Insecurity: Not on file  Transportation Needs: Not on file  Physical Activity: Not on file  Stress: Not on file  Social Connections: Not on file  Intimate Partner Violence: Not  on file     Current Outpatient Medications:  .  ADVAIR HFA 518-84 MCG/ACT inhaler, INHALE 2 PUFFS INTO THE LUNGS TWICE DAILY, Disp: 12 g, Rfl: 1 .  AMBULATORY NON FORMULARY MEDICATION, Blood pressure monitor use daily for hypertension., Disp: 1 Device, Rfl: 0 .  benzonatate (TESSALON) 100 MG capsule, Take 1-2 capsules (100-200 mg total) by mouth 3 (three) times daily as needed for cough., Disp: 40 capsule, Rfl: 0 .  cefdinir (OMNICEF) 300 MG capsule, Take 1 capsule (300 mg total) by mouth 2 (two) times daily for 7 days., Disp: 14 capsule, Rfl: 0 .  Chlorpheniramine-DM (CORICIDIN HBP COUGH/COLD PO), Take by mouth., Disp: , Rfl:  .  Cholecalciferol (VITAMIN D3) 5000 units  CAPS, Take 5,000 Units by mouth daily., Disp: , Rfl:  .  diclofenac Sodium (VOLTAREN) 1 % GEL, APPLY 2 GRAMS TO THE AFFECTED JOINT FOUR TIMES DAILY, Disp: 600 g, Rfl: 3 .  digoxin (LANOXIN) 0.125 MG tablet, TAKE 1 TABLET BY MOUTH EVERY DAY, Disp: 90 tablet, Rfl: 3 .  fluticasone (FLONASE) 50 MCG/ACT nasal spray, SPRAY 1 SPRAY IN EACH NOSTRILS BID, Disp: , Rfl: 0 .  Ipratropium-Albuterol (COMBIVENT) 20-100 MCG/ACT AERS respimat, Inhale 1 puff into the lungs every 6 (six) hours as needed for wheezing or shortness of breath., Disp: 4 g, Rfl: 5 .  ipratropium-albuterol (DUONEB) 0.5-2.5 (3) MG/3ML SOLN, Use 1 every 6 hours as needed for wheezing, Disp: 360 mL, Rfl: 1 .  losartan (COZAAR) 100 MG tablet, TAKE 1 TABLET(100 MG) BY MOUTH DAILY, Disp: 90 tablet, Rfl: 1 .  metoprolol succinate (TOPROL-XL) 100 MG 24 hr tablet, TAKE 1 TABLET BY MOUTH EVERY DAY, Disp: 90 tablet, Rfl: 0 .  omeprazole (PRILOSEC) 40 MG capsule, TAKE 1 CAPSULE(40 MG) BY MOUTH DAILY, Disp: 90 capsule, Rfl: 3 .  ondansetron (ZOFRAN) 4 MG tablet, Take 1 tablet (4 mg total) by mouth every 8 (eight) hours as needed for nausea or vomiting., Disp: 20 tablet, Rfl: 0 .  promethazine-codeine (PHENERGAN WITH CODEINE) 6.25-10 MG/5ML syrup, Take 5 mLs by mouth at bedtime as needed for cough., Disp: 50 mL, Rfl: 0 .  tiotropium (SPIRIVA) 18 MCG inhalation capsule, Place 1 capsule (18 mcg total) into inhaler and inhale daily., Disp: 90 capsule, Rfl: 3 .  triamterene-hydrochlorothiazide (MAXZIDE-25) 37.5-25 MG tablet, Take 1 tablet by mouth daily. NEEDS APPT/LABS, Disp: 90 tablet, Rfl: 2 .  doxycycline (VIBRA-TABS) 100 MG tablet, Take 1 tablet (100 mg total) by mouth 2 (two) times daily for 7 days., Disp: 14 tablet, Rfl: 0  EXAM:  VITALS per patient if applicable: Wt 194 lb (88 kg)   BMI 33.30 kg/m   GENERAL: alert, oriented, in no acute distress  LUNGS:no signs of respiratory distress, breathing rate appears normal, no obvious gross SOB,  gasping or wheezing   PSYCH/NEURO: pleasant and cooperative, no obvious depression or anxiety, speech and thought processing grossly intact  ASSESSMENT AND PLAN:  Discussed the following assessment and plan:  COPD exacerbation ([place]) She has improved some but still sounds ill.  Initially was going to add azithromycin, however there is interaction with digoxin.  Will add doxycycline and have her complete course of cefdinir Unfortunately she has allergy to corticosteroids.  Continue current maintenance inhalers and duoneb prn.  Instructed to contact clinic for in person visit if not improving.   30 minutes spent including pre visit preparation, review of prior notes and labs, encounter with patient via telephone visit and same day documentation.    I  discussed the assessment and treatment plan with the patient. The patient was provided an opportunity to ask questions and all were answered. The patient agreed with the plan and demonstrated an understanding of the instructions.   The patient was advised to call back or seek an in-person evaluation if the symptoms worsen or if the condition fails to improve as anticipated.    Luetta Nutting, DO

## 2020-07-02 NOTE — Progress Notes (Signed)
Addendum completed.

## 2020-08-15 ENCOUNTER — Telehealth: Payer: Self-pay

## 2020-08-15 NOTE — Telephone Encounter (Signed)
Left message for patient to call back  

## 2020-08-15 NOTE — Telephone Encounter (Signed)
[name] called and left a message stating she has increased heart rate and elevated blood pressure. I called and left a message for a return call.

## 2020-08-16 ENCOUNTER — Encounter: Payer: Self-pay | Admitting: Family Medicine

## 2020-08-16 ENCOUNTER — Ambulatory Visit (INDEPENDENT_AMBULATORY_CARE_PROVIDER_SITE_OTHER): Payer: Medicare Other | Admitting: Family Medicine

## 2020-08-16 ENCOUNTER — Other Ambulatory Visit (INDEPENDENT_AMBULATORY_CARE_PROVIDER_SITE_OTHER): Payer: Medicare Other

## 2020-08-16 ENCOUNTER — Other Ambulatory Visit: Payer: Self-pay

## 2020-08-16 VITALS — BP 134/45 | HR 65 | Temp 97.5°F | Wt 188.8 lb

## 2020-08-16 DIAGNOSIS — I480 Paroxysmal atrial fibrillation: Secondary | ICD-10-CM

## 2020-08-16 DIAGNOSIS — Z20822 Contact with and (suspected) exposure to covid-19: Secondary | ICD-10-CM | POA: Insufficient documentation

## 2020-08-16 NOTE — Assessment & Plan Note (Addendum)
Her symptoms she is experiencing over the past week are suspicious for Covid.  Covid swab completed today however she does seem to be improving at this point.  She has completed an initial 5-day quarantine and she should continue to mask an additional 5 days for a total of 10 days from symptom onset.  She is instructed to seek emergency care for worsening shortness of breath, severe fatigue or inability to keep herself hydrated.

## 2020-08-16 NOTE — Telephone Encounter (Signed)
Received voicemail from Kimberly Jackson stating she was returning a call.   Returned patient's call. No answer. LVM advising patient we had received her original message stating, 'increased HR and elevated BP'. Requested callback at her earliest convenience with direct information.

## 2020-08-16 NOTE — Assessment & Plan Note (Signed)
Normal sinus rhythm on exam today.  EKG completed showing normal sinus rhythm with a rate of 65, no ST-T wave changes, normal PR and QTC. She is on digoxin as well as metoprolol which would be reasonable for history of paroxysmal atrial fibrillation, however she states she was told by a cardiologist in [place] that this is not what she had.  I have placed a referral to cardiology here and we will try to get records from her previous cardiologist in [place].

## 2020-08-16 NOTE — Patient Instructions (Signed)
We'll be in touch with COVID results.  Continue supportive and symptomatic care with increased fluids and over the counter medications.  Continue digoxin and metoprolol.  I have entered a referral to cardiology.

## 2020-08-16 NOTE — Progress Notes (Signed)
Kimberly Jackson - 75 y.o. female MRN [id number]  Date of birth: May 22, 1946  Subjective Chief Complaint  Patient presents with  . Tachycardia  . Covid Exposure    HPI Kimberly Jackson is a 75 year old female with history of paroxysmal atrial fibrillation and COPD.  She has complaint today of episodes of tachycardia and palpitations.  This feels similar to her previous episodes she has had.  Per review of her chart she is listed to have paroxysmal atrial fibrillation.  She states that her primary care doctor in [place] told her the same thing however she was seen by cardiologist there and was told that she did not have A. fib but  something different.  She does not recall what this was.  I do not have any previous EKGs on her.  She has been on digoxin as well as metoprolol.  She has not seen a cardiologist locally.  She denies any chest pain, shortness of breath, dizziness or nausea associated with this episode.    She has had a cold for the past 9 days.  States she has had congestion with cough and fatigue.  She has not had any increased wheezing.  Her mucus was dark however has cleared up over the past couple of days.  She feels like her symptoms are improving.  She has been taking Coricidin but denies any decongestants containing Sudafed and/or phenylephrine.  ROS:  A comprehensive ROS was completed and negative except as noted per HPI  Allergies  Allergen Reactions  . Corticosteroids Other (See Comments)    Suicidal  . Compazine [Prochlorperazine] Other (See Comments)    Acute dystonic reaction type  . Eggs Or Egg-Derived Products Nausea And Vomiting  . Nickel Rash    skin  . Penicillins Rash    Past Medical History:  Diagnosis Date  . Alcohol abuse   . COPD (chronic obstructive pulmonary disease) ([place])   . Depression   . Reflux     Past Surgical History:  Procedure Laterality Date  . ABDOMINAL HYSTERECTOMY    . APPENDECTOMY     75 yo  . BLADDER SURGERY  2015  . CHOLECYSTECTOMY     . REPLACEMENT TOTAL KNEE    . ROTATOR CUFF REPAIR    . TONSILLECTOMY      Social History   Socioeconomic History  . Marital status: Married    Spouse name: Not on file  . Number of children: Not on file  . Years of education: Not on file  . Highest education level: Not on file  Occupational History  . Not on file  Tobacco Use  . Smoking status: Never Smoker  . Smokeless tobacco: Never Used  Vaping Use  . Vaping Use: Never used  Substance and Sexual Activity  . Alcohol use: No    Alcohol/week: 0.0 standard drinks  . Drug use: No  . Sexual activity: Not on file  Other Topics Concern  . Not on file  Social History Narrative  . Not on file   Social Determinants of Health   Financial Resource Strain: Not on file  Food Insecurity: Not on file  Transportation Needs: Not on file  Physical Activity: Not on file  Stress: Not on file  Social Connections: Not on file    Family History  Problem Relation Age of Onset  . Alcohol abuse Mother     Health Maintenance  Topic Date Due  . COVID-19 Vaccine (3 - Booster for Pfizer series) 04/26/2020  . COLONOSCOPY (Pts 45-64yrs  Insurance coverage will need to be confirmed)  09/16/2022  . TETANUS/TDAP  11/19/2024  . INFLUENZA VACCINE  Completed  . DEXA SCAN  Completed  . Hepatitis C Screening  Completed  . PNA vac Low Risk Adult  Completed     ----------------------------------------------------------------------------------------------------------------------------------------------------------------------------------------------------------------- Physical Exam BP (!) 134/45 (BP Location: Left Arm, Patient Position: Sitting, Cuff Size: Large)   Pulse 65   Temp (!) 97.5 F (36.4 C)   Wt 188 lb 12.8 oz (85.6 kg)   SpO2 97%   BMI 32.41 kg/m   Physical Exam Constitutional:      Appearance: Normal appearance.  HENT:     Head: Normocephalic and atraumatic.  Eyes:     General: No scleral icterus. Cardiovascular:      Rate and Rhythm: Normal rate and regular rhythm.  Pulmonary:     Effort: Pulmonary effort is normal.     Breath sounds: Normal breath sounds.  Musculoskeletal:     Cervical back: Neck supple.  Lymphadenopathy:     Cervical: No cervical adenopathy.  Skin:    General: Skin is warm and dry.  Neurological:     General: No focal deficit present.     Mental Status: She is alert.  Psychiatric:        Mood and Affect: Mood normal.        Behavior: Behavior normal.     ------------------------------------------------------------------------------------------------------------------------------------------------------------------------------------------------------------------- Assessment and Plan  Paroxysmal a-fib (HCC) Normal sinus rhythm on exam today.  EKG completed showing normal sinus rhythm with a rate of 65, no ST-T wave changes, normal PR and QTC. She is on digoxin as well as metoprolol which would be reasonable for history of paroxysmal atrial fibrillation, however she states she was told by a cardiologist in [place] that this is not what she had.  I have placed a referral to cardiology here and we will try to get records from her previous cardiologist in [place].  Suspected COVID-19 virus infection Her symptoms she is experiencing over the past week are suspicious for Covid.  Covid swab completed today however she does seem to be improving at this point.  She has completed an initial 5-day quarantine and she should continue to mask an additional 5 days for a total of 10 days from symptom onset.  She is instructed to seek emergency care for worsening shortness of breath, severe fatigue or inability to keep herself hydrated.   No orders of the defined types were placed in this encounter.   No follow-ups on file.    This visit occurred during the SARS-CoV-2 public health emergency.  Safety protocols were in place, including screening questions prior to the visit,  additional usage of staff PPE, and extensive cleaning of exam room while observing appropriate contact time as indicated for disinfecting solutions.

## 2020-08-18 LAB — NOVEL CORONAVIRUS, NAA: SARS-CoV-2, NAA: DETECTED — AB

## 2020-08-18 LAB — SARS-COV-2, NAA 2 DAY TAT

## 2020-08-23 ENCOUNTER — Telehealth (INDEPENDENT_AMBULATORY_CARE_PROVIDER_SITE_OTHER): Payer: Medicare Other | Admitting: Family Medicine

## 2020-08-23 NOTE — Progress Notes (Signed)
Patient feeling better, did not need appt.

## 2020-09-22 ENCOUNTER — Other Ambulatory Visit: Payer: Self-pay | Admitting: Family Medicine

## 2020-10-09 NOTE — Progress Notes (Signed)
Referring-C[place] [name] DO Reason for referral-atrial fibrillation  HPI: 75 year old female for evaluation of atrial fibrillation at request of Luetta Nutting, DO.  Chest CT August 2020 showed stable pulmonary nodules and aortic atherosclerosis.  Patient apparently told by primary care in [place] in the past that she had paroxysmal atrial fibrillation but a cardiologist disagreed.  She has had palpitations and cardiology asked to evaluate.  Patient states that she has dyspnea on exertion which she attributes to pulmonary disease.  She denies orthopnea, PND, pedal edema.  She occasionally has a sharp pain in her chest for seconds but denies exertional chest pain.  She states that 2 months ago she noticed that her heart rate was increased in the 120-130 range for 24 hours.  There was associated palpitations.  This resolved spontaneously.  She now presents for further evaluation.  Current Outpatient Medications  Medication Sig Dispense Refill  . ADVAIR HFA 230-21 MCG/ACT inhaler INHALE 2 PUFFS INTO THE LUNGS TWICE DAILY 12 g 1  . AMBULATORY NON FORMULARY MEDICATION Blood pressure monitor use daily for hypertension. 1 Device 0  . Chlorpheniramine-DM (CORICIDIN HBP COUGH/COLD PO) Take by mouth.    . Cholecalciferol (VITAMIN D3) 5000 units CAPS Take 5,000 Units by mouth daily.    . diclofenac Sodium (VOLTAREN) 1 % GEL APPLY 2 GRAMS TO THE AFFECTED JOINT FOUR TIMES DAILY 600 g 3  . Ipratropium-Albuterol (COMBIVENT) 20-100 MCG/ACT AERS respimat Inhale 1 puff into the lungs every 6 (six) hours as needed for wheezing or shortness of breath. 4 g 5  . ipratropium-albuterol (DUONEB) 0.5-2.5 (3) MG/3ML SOLN Use 1 every 6 hours as needed for wheezing 360 mL 1  . losartan (COZAAR) 100 MG tablet TAKE 1 TABLET(100 MG) BY MOUTH DAILY 90 tablet 1  . metoprolol succinate (TOPROL-XL) 100 MG 24 hr tablet TAKE 1 TABLET BY MOUTH EVERY DAY 90 tablet 0  . omeprazole (PRILOSEC) 40 MG capsule TAKE 1 CAPSULE(40 MG) BY  MOUTH DAILY 90 capsule 3  . ondansetron (ZOFRAN) 4 MG tablet Take 1 tablet (4 mg total) by mouth every 8 (eight) hours as needed for nausea or vomiting. 20 tablet 0  . tiotropium (SPIRIVA) 18 MCG inhalation capsule Place 1 capsule (18 mcg total) into inhaler and inhale daily. 90 capsule 3  . triamterene-hydrochlorothiazide (MAXZIDE-25) 37.5-25 MG tablet Take 1 tablet by mouth daily. NEEDS APPT/LABS 90 tablet 2   No current facility-administered medications for this visit.    Allergies  Allergen Reactions  . Corticosteroids Other (See Comments)    Suicidal  . Prochlorperazine Other (See Comments)    Acute dystonic reaction type  . Eggs Or Egg-Derived Products Nausea And Vomiting  . Nickel Rash    skin  . Penicillins Rash     Past Medical History:  Diagnosis Date  . Alcohol abuse   . COPD (chronic obstructive pulmonary disease) ([place])   . Depression   . Hypertension   . OSA (obstructive sleep apnea)   . Reflux     Past Surgical History:  Procedure Laterality Date  . ABDOMINAL HYSTERECTOMY    . APPENDECTOMY     75 yo  . BLADDER SURGERY  2015  . CHOLECYSTECTOMY    . REPLACEMENT TOTAL KNEE    . ROTATOR CUFF REPAIR    . TONSILLECTOMY      Social History   Socioeconomic History  . Marital status: Married    Spouse name: Not on file  . Number of children: 3  . Years of education:  Not on file  . Highest education level: Not on file  Occupational History  . Not on file  Tobacco Use  . Smoking status: Never Smoker  . Smokeless tobacco: Never Used  Vaping Use  . Vaping Use: Never used  Substance and Sexual Activity  . Alcohol use: No    Alcohol/week: 0.0 standard drinks  . Drug use: No  . Sexual activity: Not on file  Other Topics Concern  . Not on file  Social History Narrative  . Not on file   Social Determinants of Health   Financial Resource Strain: Not on file  Food Insecurity: Not on file  Transportation Needs: Not on file  Physical Activity: Not on  file  Stress: Not on file  Social Connections: Not on file  Intimate Partner Violence: Not on file    Family History  Problem Relation Age of Onset  . Alcohol abuse Mother   . Atrial fibrillation Father     ROS: no fevers or chills, productive cough, hemoptysis, dysphasia, odynophagia, melena, hematochezia, dysuria, hematuria, rash, seizure activity, orthopnea, PND, pedal edema, claudication. Remaining systems are negative.  Physical Exam:   Blood pressure (!) 118/54, pulse 63, height 5\' 4"  (1.626 m), weight 192 lb 1.9 oz (87.1 kg), SpO2 99 %.  General:  Well developed/well nourished in NAD Skin warm/dry Patient not depressed No peripheral clubbing Back-normal HEENT-normal/normal eyelids Neck supple/normal carotid upstroke bilaterally; no bruits; no JVD; no thyromegaly chest - CTA/ normal expansion CV - RRR/normal S1 and S2; no murmurs, rubs or gallops;  PMI nondisplaced Abdomen -NT/ND, no HSM, no mass, + bowel sounds, no bruit 2+ femoral pulses, no bruits Ext-no edema, chords, 2+ DP Neuro-grossly nonfocal  ECG -August 16, 2020-sinus rhythm with no ST changes.  Personally reviewed  A/P  1 question paroxysmal atrial fibrillation-we have no records available to confirm this.  By report a cardiologist in [place] told her she did not have atrial fibrillation.  We will obtain outside records if possible. Schedule echocardiogram to assess LV function.  I will continue metoprolol but discontinue digoxin.  2 palpitations-patient had an episode of palpitations approximately 2 months ago but none since that time.  We discussed purchasing a room watch to record rhythm strips.  If atrial fibrillation was documented she would need anticoagulation.  We will continue beta-blocker but discontinue digoxin as outlined above.  3 hypertension-patient's blood pressure is controlled.  Continue present medications and follow.  4 obstructive sleep apnea-I have encouraged him to follow-up with  pulmonary.  I explained that this can contribute atrial fibrillation if this is indeed documented.  Kirk Ruths, MD

## 2020-10-10 ENCOUNTER — Ambulatory Visit: Payer: Medicare Other | Admitting: Family Medicine

## 2020-10-11 ENCOUNTER — Encounter: Payer: Self-pay | Admitting: Cardiology

## 2020-10-11 ENCOUNTER — Ambulatory Visit (INDEPENDENT_AMBULATORY_CARE_PROVIDER_SITE_OTHER): Payer: Medicare Other | Admitting: Cardiology

## 2020-10-11 ENCOUNTER — Other Ambulatory Visit: Payer: Self-pay

## 2020-10-11 VITALS — BP 118/54 | HR 63 | Ht 64.0 in | Wt 192.1 lb

## 2020-10-11 DIAGNOSIS — I480 Paroxysmal atrial fibrillation: Secondary | ICD-10-CM | POA: Diagnosis not present

## 2020-10-11 DIAGNOSIS — R002 Palpitations: Secondary | ICD-10-CM

## 2020-10-11 DIAGNOSIS — I10 Essential (primary) hypertension: Secondary | ICD-10-CM | POA: Diagnosis not present

## 2020-10-11 NOTE — Patient Instructions (Signed)
Medication Instructions:   STOP DIGOXIN  *If you need a refill on your cardiac medications before your next appointment, please call your pharmacy   Testing/Procedures:  Your physician has requested that you have an echocardiogram. Echocardiography is a painless test that uses sound waves to create images of your heart. It provides your doctor with information about the size and shape of your heart and how well your heart's chambers and valves are working. This procedure takes approximately one hour. There are no restrictions for this procedure.[place]     Follow-Up: At [facility], you and your health needs are our priority.  As part of our continuing mission to provide you with exceptional heart care, we have created designated Provider Care Teams.  These Care Teams include your primary Cardiologist (physician) and Advanced Practice Providers (APPs -  Physician Assistants and Nurse Practitioners) who all work together to provide you with the care you need, when you need it.  We recommend signing up for the patient portal called "MyChart".  Sign up information is provided on this After Visit Summary.  MyChart is used to connect with patients for Virtual Visits (Telemedicine).  Patients are able to view lab/test results, encounter notes, upcoming appointments, etc.  Non-urgent messages can be sent to your provider as well.   To learn more about what you can do with MyChart, go to [email or web address].    Your next appointment:   4 month(s)  The format for your next appointment:   In Person  Provider:   Kirk Ruths, MD

## 2020-11-07 ENCOUNTER — Other Ambulatory Visit: Payer: Self-pay

## 2020-11-07 ENCOUNTER — Ambulatory Visit (HOSPITAL_COMMUNITY): Payer: Medicare Other | Attending: Cardiovascular Disease

## 2020-11-07 DIAGNOSIS — I480 Paroxysmal atrial fibrillation: Secondary | ICD-10-CM | POA: Insufficient documentation

## 2020-11-07 DIAGNOSIS — R002 Palpitations: Secondary | ICD-10-CM | POA: Diagnosis not present

## 2020-11-07 LAB — ECHOCARDIOGRAM COMPLETE
Area-P 1/2: 4.49 cm2
MV M vel: 5.71 m/s
MV Peak grad: 130.4 mmHg
Radius: 0.5 cm
S' Lateral: 3.4 cm

## 2020-11-09 ENCOUNTER — Encounter: Payer: Self-pay | Admitting: *Deleted

## 2020-12-03 ENCOUNTER — Other Ambulatory Visit: Payer: Self-pay | Admitting: Family Medicine

## 2020-12-04 ENCOUNTER — Other Ambulatory Visit: Payer: Self-pay

## 2020-12-04 MED ORDER — TRIAMTERENE-HCTZ 37.5-25 MG PO TABS
1.0000 | ORAL_TABLET | Freq: Every day | ORAL | 0 refills | Status: DC
Start: 2020-12-04 — End: 2021-03-07

## 2021-01-01 ENCOUNTER — Other Ambulatory Visit: Payer: Self-pay | Admitting: Family Medicine

## 2021-01-02 ENCOUNTER — Encounter: Payer: Self-pay | Admitting: Family Medicine

## 2021-01-02 ENCOUNTER — Other Ambulatory Visit: Payer: Self-pay

## 2021-01-02 ENCOUNTER — Ambulatory Visit (INDEPENDENT_AMBULATORY_CARE_PROVIDER_SITE_OTHER): Payer: Medicare Other | Admitting: Family Medicine

## 2021-01-02 DIAGNOSIS — I480 Paroxysmal atrial fibrillation: Secondary | ICD-10-CM | POA: Diagnosis not present

## 2021-01-02 DIAGNOSIS — I10 Essential (primary) hypertension: Secondary | ICD-10-CM

## 2021-01-02 DIAGNOSIS — J449 Chronic obstructive pulmonary disease, unspecified: Secondary | ICD-10-CM

## 2021-01-02 NOTE — Progress Notes (Signed)
Kimberly Jackson - 75 y.o. female MRN [id number]  Date of birth: December 26, 1945  Subjective Chief Complaint  Patient presents with   Follow-up    HPI Kimberly Jackson is a 75 y.o. female here today for follow up visit.  She has established with cardiology since her last visit with me.  She reports that she is doing well.   Seen by cardiology and digoxin discontinued.  She continues on metoprolol.  She is doing well with this.  She has not had increased palpitations since stopping digoxin.  She does have increased palpitations is she misses dose of metoprolol.  She denies chest pain, shortness of breath, palpitations, headache or vision changes.  BP has remained well controlled as well.   COPD has remained well controlled with current inhalers.  She denies increased wheezing or shortness of breath.  ROS:  A comprehensive ROS was completed and negative except as noted per HPI  Allergies  Allergen Reactions   Corticosteroids Other (See Comments)    Suicidal   Prochlorperazine Other (See Comments)    Acute dystonic reaction type   Eggs Or Egg-Derived Products Nausea And Vomiting   Nickel Rash    skin   Penicillins Rash    Past Medical History:  Diagnosis Date   Alcohol abuse    COPD (chronic obstructive pulmonary disease) (HCC)    Depression    Hypertension    OSA (obstructive sleep apnea)    Reflux     Past Surgical History:  Procedure Laterality Date   ABDOMINAL HYSTERECTOMY     APPENDECTOMY     75 yo   BLADDER SURGERY  2015   CHOLECYSTECTOMY     REPLACEMENT TOTAL KNEE     ROTATOR CUFF REPAIR     TONSILLECTOMY      Social History   Socioeconomic History   Marital status: Married    Spouse name: Not on file   Number of children: 3   Years of education: Not on file   Highest education level: Not on file  Occupational History   Not on file  Tobacco Use   Smoking status: Never   Smokeless tobacco: Never  Vaping Use   Vaping Use: Never used  Substance and Sexual  Activity   Alcohol use: No    Alcohol/week: 0.0 standard drinks   Drug use: No   Sexual activity: Not on file  Other Topics Concern   Not on file  Social History Narrative   Not on file   Social Determinants of Health   Financial Resource Strain: Not on file  Food Insecurity: Not on file  Transportation Needs: Not on file  Physical Activity: Not on file  Stress: Not on file  Social Connections: Not on file    Family History  Problem Relation Age of Onset   Alcohol abuse Mother    Atrial fibrillation Father     Health Maintenance  Topic Date Due   Zoster Vaccines- Shingrix (1 of 2) Never done   COVID-19 Vaccine (3 - Pfizer risk series) 11/23/2019   INFLUENZA VACCINE  02/19/2021   COLONOSCOPY (Pts 45-68yrs Insurance coverage will need to be confirmed)  09/16/2022   TETANUS/TDAP  11/19/2024   DEXA SCAN  Completed   Hepatitis C Screening  Completed   PNA vac Low Risk Adult  Completed   HPV VACCINES  Aged Out     ----------------------------------------------------------------------------------------------------------------------------------------------------------------------------------------------------------------- Physical Exam BP (!) 130/44 (BP Location: Left Arm, Patient Position: Sitting, Cuff Size: Large)   Pulse [name])  57   Temp (!) 97.5 F (36.4 C)   Ht 5\' 4"  (1.626 m)   Wt 196 lb (88.9 kg)   SpO2 98%   BMI 33.64 kg/m   Physical Exam Constitutional:      Appearance: Normal appearance.  HENT:     Head: Normocephalic and atraumatic.  Cardiovascular:     Rate and Rhythm: Normal rate and regular rhythm.  Pulmonary:     Effort: Pulmonary effort is normal.     Breath sounds: Normal breath sounds.  Musculoskeletal:     Cervical back: Neck supple.  Neurological:     General: No focal deficit present.     Mental Status: She is alert.  Psychiatric:        Mood and Affect: Mood normal.        Behavior: Behavior normal.     ------------------------------------------------------------------------------------------------------------------------------------------------------------------------------------------------------------------- Assessment and Plan  Paroxysmal a-fib (HCC) Digoxin d/c'ed.  Continue metoprolol.  She will continue to see cardiology for ongoing management.    HTN (hypertension) Blood pressure is at goal at for age and co-morbidities.  I recommend continuation of current medications.  In addition they were instructed to follow a low sodium diet with regular exercise to help to maintain adequate control of blood pressure.    COPD (chronic obstructive pulmonary disease) (HCC) Symptoms are well managed with spiriva and advair.  Will continue at current strength and dosing.     No orders of the defined types were placed in this encounter.   Return in about 6 months (around 07/04/2021) for HTN/PAF/COPD.    This visit occurred during the SARS-CoV-2 public health emergency.  Safety protocols were in place, including screening questions prior to the visit, additional usage of staff PPE, and extensive cleaning of exam room while observing appropriate contact time as indicated for disinfecting solutions.

## 2021-01-02 NOTE — Assessment & Plan Note (Signed)
Blood pressure is at goal at for age and co-morbidities.  I recommend continuation of current medications.  In addition they were instructed to follow a low sodium diet with regular exercise to help to maintain adequate control of blood pressure.  ? ?

## 2021-01-02 NOTE — Assessment & Plan Note (Signed)
Symptoms are well managed with spiriva and advair.  Will continue at current strength and dosing.

## 2021-01-02 NOTE — Patient Instructions (Signed)
Great to see you! Continue current medications.  See me again in 6 months.  

## 2021-01-02 NOTE — Assessment & Plan Note (Signed)
Digoxin d/c'ed.  Continue metoprolol.  She will continue to see cardiology for ongoing management.

## 2021-01-29 NOTE — Progress Notes (Signed)
HPI: FU atrial fibrillation.  Chest CT August 2020 showed stable pulmonary nodules and aortic atherosclerosis.  Patient apparently told by primary care in [place] in the past that she had paroxysmal atrial fibrillation but a cardiologist disagreed.  Echocardiogram April 2022 showed normal LV function, mild right atrial enlargement, moderate mitral regurgitation and trace aortic insufficiency. Since last seen, she has dyspnea with more vigorous activities but not routine activities; no exertional CP; occasional palpitations; no syncope.   Current Outpatient Medications  Medication Sig Dispense Refill   ADVAIR HFA 230-21 MCG/ACT inhaler INHALE 2 PUFFS INTO THE LUNGS TWICE DAILY 12 g 1   AMBULATORY NON FORMULARY MEDICATION Blood pressure monitor use daily for hypertension. 1 Device 0   Chlorpheniramine-DM (CORICIDIN HBP COUGH/COLD PO) Take by mouth.     Cholecalciferol (VITAMIN D3) 5000 units CAPS Take 5,000 Units by mouth daily.     diclofenac Sodium (VOLTAREN) 1 % GEL APPLY 2 GRAMS TO THE AFFECTED JOINT FOUR TIMES DAILY 600 g 3   Ipratropium-Albuterol (COMBIVENT) 20-100 MCG/ACT AERS respimat Inhale 1 puff into the lungs every 6 (six) hours as needed for wheezing or shortness of breath. 4 g 5   ipratropium-albuterol (DUONEB) 0.5-2.5 (3) MG/3ML SOLN Use 1 every 6 hours as needed for wheezing 360 mL 1   losartan (COZAAR) 100 MG tablet TAKE 1 TABLET(100 MG) BY MOUTH DAILY 90 tablet 1   metoprolol succinate (TOPROL-XL) 100 MG 24 hr tablet TAKE 1 TABLET BY MOUTH EVERY DAY 90 tablet 0   omeprazole (PRILOSEC) 40 MG capsule TAKE 1 CAPSULE(40 MG) BY MOUTH DAILY 90 capsule 3   tiotropium (SPIRIVA) 18 MCG inhalation capsule Place 1 capsule (18 mcg total) into inhaler and inhale daily. 90 capsule 3   triamterene-hydrochlorothiazide (MAXZIDE-25) 37.5-25 MG tablet Take 1 tablet by mouth daily. 90 tablet 0   ondansetron (ZOFRAN) 4 MG tablet Take 1 tablet (4 mg total) by mouth every 8 (eight) hours as  needed for nausea or vomiting. (Patient not taking: Reported on 02/05/2021) 20 tablet 0   No current facility-administered medications for this visit.     Past Medical History:  Diagnosis Date   Alcohol abuse    COPD (chronic obstructive pulmonary disease) (HCC)    Depression    Hypertension    OSA (obstructive sleep apnea)    Reflux     Past Surgical History:  Procedure Laterality Date   ABDOMINAL HYSTERECTOMY     APPENDECTOMY     75 yo   BLADDER SURGERY  2015   CHOLECYSTECTOMY     REPLACEMENT TOTAL KNEE     ROTATOR CUFF REPAIR     TONSILLECTOMY      Social History   Socioeconomic History   Marital status: Married    Spouse name: Not on file   Number of children: 3   Years of education: Not on file   Highest education level: Not on file  Occupational History   Not on file  Tobacco Use   Smoking status: Never   Smokeless tobacco: Never  Vaping Use   Vaping Use: Never used  Substance and Sexual Activity   Alcohol use: No    Alcohol/week: 0.0 standard drinks   Drug use: No   Sexual activity: Not on file  Other Topics Concern   Not on file  Social History Narrative   Not on file   Social Determinants of Health   Financial Resource Strain: Not on file  Food Insecurity: Not on file  Transportation Needs: Not on file  Physical Activity: Not on file  Stress: Not on file  Social Connections: Not on file  Intimate Partner Violence: Not on file    Family History  Problem Relation Age of Onset   Alcohol abuse Mother    Atrial fibrillation Father     ROS: no fevers or chills, productive cough, hemoptysis, dysphasia, odynophagia, melena, hematochezia, dysuria, hematuria, rash, seizure activity, orthopnea, PND, pedal edema, claudication. Remaining systems are negative.  Physical Exam: Well-developed well-nourished in no acute distress.  Skin is warm and dry.  HEENT is normal.  Neck is supple.  Chest is clear to auscultation with normal expansion.   Cardiovascular exam is regular rate and rhythm.  Abdominal exam nontender or distended. No masses palpated. Extremities show no edema. neuro grossly intact   A/P  1 Question atrial fibrillation-No records to document; continue beta blocker.  LV function normal. I have asked her to see if she can obtain a rhythm strip if she develops palpitations.  2 hypertension-blood pressure controlled.  Continue present medications.  3 history of palpitations-We again discussed the possibility of purchasing an apple watch or obtaining a rhythm strip if she has recurrences.  If atrial fibrillation documented she would need anticoagulation.  4 obstructive sleep apnea-Per pulmonary.  5 mitral regurgitation-FU echo 5/22.  Kirk Ruths, MD

## 2021-02-05 ENCOUNTER — Ambulatory Visit (INDEPENDENT_AMBULATORY_CARE_PROVIDER_SITE_OTHER): Payer: Medicare Other | Admitting: Cardiology

## 2021-02-05 ENCOUNTER — Encounter: Payer: Self-pay | Admitting: Cardiology

## 2021-02-05 ENCOUNTER — Other Ambulatory Visit: Payer: Self-pay

## 2021-02-05 VITALS — BP 128/70 | HR 60 | Ht 64.0 in | Wt 194.4 lb

## 2021-02-05 DIAGNOSIS — I10 Essential (primary) hypertension: Secondary | ICD-10-CM

## 2021-02-05 DIAGNOSIS — R002 Palpitations: Secondary | ICD-10-CM | POA: Diagnosis not present

## 2021-02-05 DIAGNOSIS — I340 Nonrheumatic mitral (valve) insufficiency: Secondary | ICD-10-CM | POA: Diagnosis not present

## 2021-02-05 NOTE — Patient Instructions (Signed)

## 2021-02-08 ENCOUNTER — Other Ambulatory Visit: Payer: Self-pay | Admitting: Family Medicine

## 2021-02-20 DIAGNOSIS — H0100A Unspecified blepharitis right eye, upper and lower eyelids: Secondary | ICD-10-CM | POA: Diagnosis not present

## 2021-02-20 DIAGNOSIS — H02834 Dermatochalasis of left upper eyelid: Secondary | ICD-10-CM | POA: Diagnosis not present

## 2021-02-20 DIAGNOSIS — H25813 Combined forms of age-related cataract, bilateral: Secondary | ICD-10-CM | POA: Diagnosis not present

## 2021-02-20 DIAGNOSIS — H5053 Vertical heterophoria: Secondary | ICD-10-CM | POA: Diagnosis not present

## 2021-02-20 DIAGNOSIS — H43813 Vitreous degeneration, bilateral: Secondary | ICD-10-CM | POA: Diagnosis not present

## 2021-02-20 DIAGNOSIS — H0100B Unspecified blepharitis left eye, upper and lower eyelids: Secondary | ICD-10-CM | POA: Diagnosis not present

## 2021-02-20 DIAGNOSIS — H527 Unspecified disorder of refraction: Secondary | ICD-10-CM | POA: Diagnosis not present

## 2021-02-20 DIAGNOSIS — H02831 Dermatochalasis of right upper eyelid: Secondary | ICD-10-CM | POA: Diagnosis not present

## 2021-02-20 DIAGNOSIS — H02824 Cysts of left upper eyelid: Secondary | ICD-10-CM | POA: Diagnosis not present

## 2021-02-20 DIAGNOSIS — H18513 Endothelial corneal dystrophy, bilateral: Secondary | ICD-10-CM | POA: Diagnosis not present

## 2021-03-07 ENCOUNTER — Other Ambulatory Visit: Payer: Self-pay | Admitting: Family Medicine

## 2021-03-30 ENCOUNTER — Other Ambulatory Visit: Payer: Self-pay | Admitting: Family Medicine

## 2021-04-03 ENCOUNTER — Other Ambulatory Visit: Payer: Self-pay

## 2021-04-03 ENCOUNTER — Ambulatory Visit (INDEPENDENT_AMBULATORY_CARE_PROVIDER_SITE_OTHER): Payer: Medicare Other | Admitting: Family Medicine

## 2021-04-03 ENCOUNTER — Encounter: Payer: Self-pay | Admitting: Family Medicine

## 2021-04-03 DIAGNOSIS — J069 Acute upper respiratory infection, unspecified: Secondary | ICD-10-CM | POA: Diagnosis not present

## 2021-04-03 DIAGNOSIS — I10 Essential (primary) hypertension: Secondary | ICD-10-CM

## 2021-04-03 MED ORDER — TIOTROPIUM BROMIDE MONOHYDRATE 18 MCG IN CAPS
18.0000 ug | ORAL_CAPSULE | Freq: Every day | RESPIRATORY_TRACT | 3 refills | Status: DC
Start: 2021-04-03 — End: 2022-05-20

## 2021-04-03 MED ORDER — ADVAIR HFA 230-21 MCG/ACT IN AERO
2.0000 | INHALATION_SPRAY | Freq: Two times a day (BID) | RESPIRATORY_TRACT | 1 refills | Status: DC
Start: 2021-04-03 — End: 2022-09-30

## 2021-04-03 MED ORDER — IPRATROPIUM-ALBUTEROL 20-100 MCG/ACT IN AERS: 1.0000 | INHALATION_SPRAY | Freq: Four times a day (QID) | RESPIRATORY_TRACT | 5 refills | Status: DC | PRN

## 2021-04-03 MED ORDER — METOPROLOL SUCCINATE ER 100 MG PO TB24
100.0000 mg | ORAL_TABLET | Freq: Every day | ORAL | 0 refills | Status: DC
Start: 2021-04-03 — End: 2021-07-06

## 2021-04-03 MED ORDER — IPRATROPIUM-ALBUTEROL 0.5-2.5 (3) MG/3ML IN SOLN: RESPIRATORY_TRACT | 1 refills | Status: DC

## 2021-04-03 NOTE — Progress Notes (Signed)
Kimberly Jackson - 75 y.o. female MRN [id number]  Date of birth: 1946/07/12  Subjective No chief complaint on file.   HPI Kimberly Jackson is a 75 year old female here today for follow-up of hypertension.  Reports her blood pressure has been more elevated over the past several days.  Readings at home have been as high as 190/110.  These have started to decrease over the past couple of days.  She reports that she was recently on vacation and was much more liberal with her diet and sodium intake.  She is now back on her regular home diet.  She did notice a little more swelling while on vacation which is improving now as well.  She has not had any symptoms related to her hypertension including chest pain, shortness of breath, palpitations, headache or vision changes.  She has had some cold symptoms including nasal congestion and cough.  She denies increased shortness of breath, fever, sinus pain or chills.  She has been using Coricidin but denies any decongestants.  ROS:  A comprehensive ROS was completed and negative except as noted per HPI  Allergies  Allergen Reactions   Corticosteroids Other (See Comments)    Suicidal   Prochlorperazine Other (See Comments)    Acute dystonic reaction type   Eggs Or Egg-Derived Products Nausea And Vomiting   Nickel Rash    skin   Penicillins Rash    Past Medical History:  Diagnosis Date   Alcohol abuse    COPD (chronic obstructive pulmonary disease) (HCC)    Depression    Hypertension    OSA (obstructive sleep apnea)    Reflux     Past Surgical History:  Procedure Laterality Date   ABDOMINAL HYSTERECTOMY     APPENDECTOMY     75 yo   BLADDER SURGERY  2015   CHOLECYSTECTOMY     REPLACEMENT TOTAL KNEE     ROTATOR CUFF REPAIR     TONSILLECTOMY      Social History   Socioeconomic History   Marital status: Married    Spouse name: Not on file   Number of children: 3   Years of education: Not on file   Highest education level: Not on file   Occupational History   Not on file  Tobacco Use   Smoking status: Never   Smokeless tobacco: Never  Vaping Use   Vaping Use: Never used  Substance and Sexual Activity   Alcohol use: No    Alcohol/week: 0.0 standard drinks   Drug use: No   Sexual activity: Not on file  Other Topics Concern   Not on file  Social History Narrative   Not on file   Social Determinants of Health   Financial Resource Strain: Not on file  Food Insecurity: Not on file  Transportation Needs: Not on file  Physical Activity: Not on file  Stress: Not on file  Social Connections: Not on file    Family History  Problem Relation Age of Onset   Alcohol abuse Mother    Atrial fibrillation Father     Health Maintenance  Topic Date Due   Zoster Vaccines- Shingrix (1 of 2) Never done   COVID-19 Vaccine (3 - Pfizer risk series) 11/23/2019   INFLUENZA VACCINE  02/19/2021   COLONOSCOPY (Pts 45-58yr Insurance coverage will need to be confirmed)  09/16/2022   TETANUS/TDAP  11/19/2024   DEXA SCAN  Completed   Hepatitis C Screening  Completed   PNA vac Low Risk Adult  Completed  HPV VACCINES  Aged Out     ----------------------------------------------------------------------------------------------------------------------------------------------------------------------------------------------------------------- Physical Exam BP (!) 175/76 (BP Location: Left Arm, Patient Position: Sitting, Cuff Size: Normal)   Pulse (!) 102   Temp 98.4 F (36.9 C)   Ht '5\' 4"'$  (1.626 m)   Wt 198 lb (89.8 kg)   SpO2 93%   BMI 33.99 kg/m   Physical Exam Constitutional:      Appearance: Normal appearance.  Eyes:     General: No scleral icterus. Cardiovascular:     Rate and Rhythm: Normal rate and regular rhythm.  Pulmonary:     Effort: Pulmonary effort is normal.     Breath sounds: Normal breath sounds.  Musculoskeletal:     Cervical back: Neck supple.  Neurological:     General: No focal deficit present.      Mental Status: She is alert.  Psychiatric:        Mood and Affect: Mood normal.        Behavior: Behavior normal.    ------------------------------------------------------------------------------------------------------------------------------------------------------------------------------------------------------------------- Assessment and Plan  Upper respiratory infection Recommend that she continue current home management with conservative and supportive care.  Increase fluids recommended and she may continue Coricidin.  No signs of COPD exacerbation at this time.  HTN (hypertension) Blood pressure elevated today.  I think this is related to her significantly increased sodium intake over the past week while on vacation.  Blood pressures seem to be improving over the past couple of days since she has returned.  She will continue to monitor and let me know if these remain elevated.  She will continue current medications.   Meds ordered this encounter  Medications   fluticasone-salmeterol (ADVAIR HFA) 230-21 MCG/ACT inhaler    Sig: Inhale 2 puffs into the lungs 2 (two) times daily.    Dispense:  12 g    Refill:  1   Ipratropium-Albuterol (COMBIVENT) 20-100 MCG/ACT AERS respimat    Sig: Inhale 1 puff into the lungs every 6 (six) hours as needed for wheezing or shortness of breath.    Dispense:  4 g    Refill:  5   ipratropium-albuterol (DUONEB) 0.5-2.5 (3) MG/3ML SOLN    Sig: Use 1 every 6 hours as needed for wheezing    Dispense:  360 mL    Refill:  1    **Patient requests 90 days supply**   metoprolol succinate (TOPROL-XL) 100 MG 24 hr tablet    Sig: Take 1 tablet (100 mg total) by mouth daily. Take with or immediately following a meal.    Dispense:  90 tablet    Refill:  0   tiotropium (SPIRIVA) 18 MCG inhalation capsule    Sig: Place 1 capsule (18 mcg total) into inhaler and inhale daily.    Dispense:  90 capsule    Refill:  3    Return in about 2 weeks (around  04/17/2021) for Nurse visit for BP.    This visit occurred during the SARS-CoV-2 public health emergency.  Safety protocols were in place, including screening questions prior to the visit, additional usage of staff PPE, and extensive cleaning of exam room while observing appropriate contact time as indicated for disinfecting solutions.

## 2021-04-03 NOTE — Assessment & Plan Note (Signed)
Recommend that she continue current home management with conservative and supportive care.  Increase fluids recommended and she may continue Coricidin.  No signs of COPD exacerbation at this time.

## 2021-04-03 NOTE — Assessment & Plan Note (Signed)
Blood pressure elevated today.  I think this is related to her significantly increased sodium intake over the past week while on vacation.  Blood pressures seem to be improving over the past couple of days since she has returned.  She will continue to monitor and let me know if these remain elevated.  She will continue current medications.

## 2021-04-09 ENCOUNTER — Other Ambulatory Visit: Payer: Self-pay | Admitting: Family Medicine

## 2021-04-09 DIAGNOSIS — Z1231 Encounter for screening mammogram for malignant neoplasm of breast: Secondary | ICD-10-CM

## 2021-04-17 ENCOUNTER — Ambulatory Visit (INDEPENDENT_AMBULATORY_CARE_PROVIDER_SITE_OTHER): Payer: Medicare Other | Admitting: Family Medicine

## 2021-04-17 ENCOUNTER — Other Ambulatory Visit: Payer: Self-pay

## 2021-04-17 VITALS — BP 117/62 | HR 63 | Resp 20 | Ht 64.0 in | Wt 198.0 lb

## 2021-04-17 DIAGNOSIS — I10 Essential (primary) hypertension: Secondary | ICD-10-CM | POA: Diagnosis not present

## 2021-04-17 NOTE — Progress Notes (Signed)
Established Patient Office Visit  Subjective:  Patient ID: Kimberly Jackson, female    DOB: [birth year]  Age: 75 y.o. MRN: [id number]  CC:  Chief Complaint  Patient presents with   Blood Pressure Check    HPI Kimberly Jackson presents for a BP check. Pt taking medication as prescribed. First BP 130/51, second BP after sitting for 10 mins is 117/62.   Past Medical History:  Diagnosis Date   Alcohol abuse    COPD (chronic obstructive pulmonary disease) (HCC)    Depression    Hypertension    OSA (obstructive sleep apnea)    Reflux     Past Surgical History:  Procedure Laterality Date   ABDOMINAL HYSTERECTOMY     APPENDECTOMY     75 yo   BLADDER SURGERY  2015   CHOLECYSTECTOMY     REPLACEMENT TOTAL KNEE     ROTATOR CUFF REPAIR     TONSILLECTOMY      Family History  Problem Relation Age of Onset   Alcohol abuse Mother    Atrial fibrillation Father     Social History   Socioeconomic History   Marital status: Married    Spouse name: Not on file   Number of children: 3   Years of education: Not on file   Highest education level: Not on file  Occupational History   Not on file  Tobacco Use   Smoking status: Never   Smokeless tobacco: Never  Vaping Use   Vaping Use: Never used  Substance and Sexual Activity   Alcohol use: No    Alcohol/week: 0.0 standard drinks   Drug use: No   Sexual activity: Not on file  Other Topics Concern   Not on file  Social History Narrative   Not on file   Social Determinants of Health   Financial Resource Strain: Not on file  Food Insecurity: Not on file  Transportation Needs: Not on file  Physical Activity: Not on file  Stress: Not on file  Social Connections: Not on file  Intimate Partner Violence: Not on file    Outpatient Medications Prior to Visit  Medication Sig Dispense Refill   AMBULATORY NON FORMULARY MEDICATION Blood pressure monitor use daily for hypertension. 1 Device 0   Cholecalciferol (VITAMIN D3) 5000  units CAPS Take 5,000 Units by mouth daily.     diclofenac Sodium (VOLTAREN) 1 % GEL APPLY 2 GRAMS TO THE AFFECTED JOINT FOUR TIMES DAILY 600 g 3   fluticasone-salmeterol (ADVAIR HFA) 230-21 MCG/ACT inhaler Inhale 2 puffs into the lungs 2 (two) times daily. 12 g 1   Ipratropium-Albuterol (COMBIVENT) 20-100 MCG/ACT AERS respimat Inhale 1 puff into the lungs every 6 (six) hours as needed for wheezing or shortness of breath. 4 g 5   ipratropium-albuterol (DUONEB) 0.5-2.5 (3) MG/3ML SOLN Use 1 every 6 hours as needed for wheezing 360 mL 1   losartan (COZAAR) 100 MG tablet TAKE 1 TABLET(100 MG) BY MOUTH DAILY 90 tablet 1   metoprolol succinate (TOPROL-XL) 100 MG 24 hr tablet Take 1 tablet (100 mg total) by mouth daily. Take with or immediately following a meal. 90 tablet 0   omeprazole (PRILOSEC) 40 MG capsule TAKE 1 CAPSULE(40 MG) BY MOUTH DAILY 90 capsule 3   ondansetron (ZOFRAN) 4 MG tablet Take 1 tablet (4 mg total) by mouth every 8 (eight) hours as needed for nausea or vomiting. 20 tablet 0   tiotropium (SPIRIVA) 18 MCG inhalation capsule Place 1 capsule (18 mcg total)  into inhaler and inhale daily. 90 capsule 3   triamterene-hydrochlorothiazide (MAXZIDE-25) 37.5-25 MG tablet TAKE 1 TABLET BY MOUTH DAILY 90 tablet 0   Chlorpheniramine-DM (CORICIDIN HBP COUGH/COLD PO) Take by mouth. (Patient not taking: Reported on 04/17/2021)     No facility-administered medications prior to visit.    Allergies  Allergen Reactions   Corticosteroids Other (See Comments)    Suicidal   Prochlorperazine Other (See Comments)    Acute dystonic reaction type   Eggs Or Egg-Derived Products Nausea And Vomiting   Nickel Rash    skin   Penicillins Rash    ROS Review of Systems    Objective:    Physical Exam  BP (!) 130/51 (BP Location: Left Arm, Patient Position: Sitting, Cuff Size: Normal)   Pulse 63   Resp 20   Ht 5\' 4"  (1.626 m)   Wt 198 lb (89.8 kg)   SpO2 98%   BMI 33.99 kg/m  Wt Readings from  Last 3 Encounters:  04/17/21 198 lb (89.8 kg)  04/03/21 198 lb (89.8 kg)  02/05/21 194 lb 6.4 oz (88.2 kg)     Health Maintenance Due  Topic Date Due   Zoster Vaccines- Shingrix (1 of 2) Never done   COVID-19 Vaccine (3 - Pfizer risk series) 11/23/2019   INFLUENZA VACCINE  02/19/2021    There are no preventive care reminders to display for this patient.  Lab Results  Component Value Date   TSH 2.86 12/13/2016   Lab Results  Component Value Date   WBC 7.5 04/12/2020   HGB 13.4 04/12/2020   HCT 39.0 04/12/2020   MCV 89.7 04/12/2020   PLT 319 04/12/2020   Lab Results  Component Value Date   NA 140 04/12/2020   K 4.5 04/12/2020   CO2 28 04/12/2020   GLUCOSE 101 04/12/2020   BUN 15 04/12/2020   CREATININE 0.94 (H) 04/12/2020   BILITOT 0.6 09/23/2019   ALKPHOS 115 12/13/2016   AST 22 09/23/2019   ALT 24 09/23/2019   PROT 6.9 09/23/2019   ALBUMIN 4.0 12/13/2016   CALCIUM 9.3 04/12/2020   GFR 67.59 02/05/2018   Lab Results  Component Value Date   CHOL 210 (H) 09/23/2019   Lab Results  Component Value Date   HDL 56 09/23/2019   Lab Results  Component Value Date   LDLCALC 125 (H) 09/23/2019   Lab Results  Component Value Date   TRIG 173 (H) 09/23/2019   Lab Results  Component Value Date   CHOLHDL 3.8 09/23/2019   Lab Results  Component Value Date   HGBA1C 5.5 09/23/2019      Assessment & Plan:  Continue taking medications as prescribed.  Problem List Items Addressed This Visit       Cardiovascular and Mediastinum   HTN (hypertension) - Primary    No orders of the defined types were placed in this encounter.   Follow-up: No follow-ups on file.    Kimberly Jackson, CMA

## 2021-04-17 NOTE — Progress Notes (Signed)
Medical screening examination/treatment was performed by qualified clinical staff member and as supervising physician I was immediately available for consultation/collaboration. I have reviewed documentation and agree with assessment and plan.  Nobel Brar, DO  

## 2021-04-26 ENCOUNTER — Telehealth: Payer: Self-pay

## 2021-04-26 NOTE — Telephone Encounter (Signed)
Medication: tiotropium (SPIRIVA) 18 MCG inhalation capsule Prior authorization submitted via CoverMyMeds on 04/26/2021 [place] submission pending

## 2021-05-21 ENCOUNTER — Ambulatory Visit
Admission: RE | Admit: 2021-05-21 | Discharge: 2021-05-21 | Disposition: A | Discharge status: Home / Self Care | Payer: Medicare Other | Source: Ambulatory Visit | Attending: Family Medicine | Admitting: Family Medicine

## 2021-05-21 ENCOUNTER — Other Ambulatory Visit: Payer: Self-pay

## 2021-05-21 DIAGNOSIS — Z1231 Encounter for screening mammogram for malignant neoplasm of breast: Secondary | ICD-10-CM

## 2021-05-29 ENCOUNTER — Other Ambulatory Visit: Payer: Self-pay | Admitting: Family Medicine

## 2021-05-30 DIAGNOSIS — H02834 Dermatochalasis of left upper eyelid: Secondary | ICD-10-CM | POA: Diagnosis not present

## 2021-05-30 DIAGNOSIS — H0100B Unspecified blepharitis left eye, upper and lower eyelids: Secondary | ICD-10-CM | POA: Diagnosis not present

## 2021-05-30 DIAGNOSIS — H18513 Endothelial corneal dystrophy, bilateral: Secondary | ICD-10-CM | POA: Diagnosis not present

## 2021-05-30 DIAGNOSIS — H02831 Dermatochalasis of right upper eyelid: Secondary | ICD-10-CM | POA: Diagnosis not present

## 2021-05-30 DIAGNOSIS — H02824 Cysts of left upper eyelid: Secondary | ICD-10-CM | POA: Diagnosis not present

## 2021-05-30 DIAGNOSIS — H52203 Unspecified astigmatism, bilateral: Secondary | ICD-10-CM | POA: Diagnosis not present

## 2021-05-30 DIAGNOSIS — H0100A Unspecified blepharitis right eye, upper and lower eyelids: Secondary | ICD-10-CM | POA: Diagnosis not present

## 2021-05-30 DIAGNOSIS — H43813 Vitreous degeneration, bilateral: Secondary | ICD-10-CM | POA: Diagnosis not present

## 2021-05-30 DIAGNOSIS — H43822 Vitreomacular adhesion, left eye: Secondary | ICD-10-CM | POA: Diagnosis not present

## 2021-05-30 DIAGNOSIS — H5053 Vertical heterophoria: Secondary | ICD-10-CM | POA: Diagnosis not present

## 2021-05-30 DIAGNOSIS — H527 Unspecified disorder of refraction: Secondary | ICD-10-CM | POA: Diagnosis not present

## 2021-05-30 DIAGNOSIS — H25813 Combined forms of age-related cataract, bilateral: Secondary | ICD-10-CM | POA: Diagnosis not present

## 2021-06-28 DIAGNOSIS — H25813 Combined forms of age-related cataract, bilateral: Secondary | ICD-10-CM | POA: Diagnosis not present

## 2021-06-28 DIAGNOSIS — H52203 Unspecified astigmatism, bilateral: Secondary | ICD-10-CM | POA: Diagnosis not present

## 2021-06-29 ENCOUNTER — Other Ambulatory Visit: Payer: Self-pay | Admitting: Family Medicine

## 2021-07-03 DIAGNOSIS — Z9049 Acquired absence of other specified parts of digestive tract: Secondary | ICD-10-CM | POA: Diagnosis not present

## 2021-07-03 DIAGNOSIS — H25812 Combined forms of age-related cataract, left eye: Secondary | ICD-10-CM | POA: Diagnosis not present

## 2021-07-03 DIAGNOSIS — G4730 Sleep apnea, unspecified: Secondary | ICD-10-CM | POA: Diagnosis not present

## 2021-07-03 DIAGNOSIS — H43813 Vitreous degeneration, bilateral: Secondary | ICD-10-CM | POA: Diagnosis not present

## 2021-07-03 DIAGNOSIS — Z8616 Personal history of COVID-19: Secondary | ICD-10-CM | POA: Diagnosis not present

## 2021-07-03 DIAGNOSIS — H52223 Regular astigmatism, bilateral: Secondary | ICD-10-CM | POA: Diagnosis not present

## 2021-07-03 DIAGNOSIS — M150 Primary generalized (osteo)arthritis: Secondary | ICD-10-CM | POA: Diagnosis not present

## 2021-07-03 DIAGNOSIS — H25813 Combined forms of age-related cataract, bilateral: Secondary | ICD-10-CM | POA: Diagnosis not present

## 2021-07-03 DIAGNOSIS — H18513 Endothelial corneal dystrophy, bilateral: Secondary | ICD-10-CM | POA: Diagnosis not present

## 2021-07-03 DIAGNOSIS — K219 Gastro-esophageal reflux disease without esophagitis: Secondary | ICD-10-CM | POA: Diagnosis not present

## 2021-07-03 DIAGNOSIS — J449 Chronic obstructive pulmonary disease, unspecified: Secondary | ICD-10-CM | POA: Diagnosis not present

## 2021-07-03 DIAGNOSIS — H02834 Dermatochalasis of left upper eyelid: Secondary | ICD-10-CM | POA: Diagnosis not present

## 2021-07-03 DIAGNOSIS — H02831 Dermatochalasis of right upper eyelid: Secondary | ICD-10-CM | POA: Diagnosis not present

## 2021-07-03 DIAGNOSIS — I10 Essential (primary) hypertension: Secondary | ICD-10-CM | POA: Diagnosis not present

## 2021-07-03 DIAGNOSIS — Z7951 Long term (current) use of inhaled steroids: Secondary | ICD-10-CM | POA: Diagnosis not present

## 2021-07-06 ENCOUNTER — Other Ambulatory Visit: Payer: Self-pay | Admitting: Family Medicine

## 2021-07-09 ENCOUNTER — Encounter: Payer: Self-pay | Admitting: Family Medicine

## 2021-07-09 ENCOUNTER — Ambulatory Visit (INDEPENDENT_AMBULATORY_CARE_PROVIDER_SITE_OTHER): Payer: Medicare Other | Admitting: Family Medicine

## 2021-07-09 ENCOUNTER — Other Ambulatory Visit: Payer: Self-pay

## 2021-07-09 VITALS — BP 147/48 | HR 69 | Temp 97.5°F | Ht 64.0 in | Wt 204.0 lb

## 2021-07-09 DIAGNOSIS — I10 Essential (primary) hypertension: Secondary | ICD-10-CM

## 2021-07-09 DIAGNOSIS — I480 Paroxysmal atrial fibrillation: Secondary | ICD-10-CM

## 2021-07-09 DIAGNOSIS — Z13220 Encounter for screening for lipoid disorders: Secondary | ICD-10-CM | POA: Diagnosis not present

## 2021-07-09 DIAGNOSIS — G5603 Carpal tunnel syndrome, bilateral upper limbs: Secondary | ICD-10-CM

## 2021-07-09 DIAGNOSIS — R7303 Prediabetes: Secondary | ICD-10-CM

## 2021-07-09 DIAGNOSIS — E559 Vitamin D deficiency, unspecified: Secondary | ICD-10-CM | POA: Diagnosis not present

## 2021-07-09 DIAGNOSIS — J449 Chronic obstructive pulmonary disease, unspecified: Secondary | ICD-10-CM

## 2021-07-09 MED ORDER — METOPROLOL SUCCINATE ER 100 MG PO TB24
100.0000 mg | ORAL_TABLET | Freq: Every day | ORAL | 3 refills | Status: DC
Start: 2021-07-09 — End: 2022-07-18

## 2021-07-09 MED ORDER — TRIAMTERENE-HCTZ 37.5-25 MG PO TABS
1.0000 | ORAL_TABLET | Freq: Every day | ORAL | 0 refills | Status: DC
Start: 2021-07-09 — End: 2021-10-08

## 2021-07-09 MED ORDER — LOSARTAN POTASSIUM 100 MG PO TABS
ORAL_TABLET | ORAL | 3 refills | Status: DC
Start: 2021-07-09 — End: 2022-07-18

## 2021-07-09 NOTE — Assessment & Plan Note (Signed)
Blood pressure mildly elevated today however readings at home are better controlled.  She will continue current medications for management of hypertension.  Low-sodium diet encouraged.

## 2021-07-09 NOTE — Progress Notes (Signed)
Kimberly Jackson - 75 y.o. female MRN [id number]  Date of birth: 10-Sep-1945  Subjective Chief Complaint  Patient presents with   Follow-up    HPI Kimberly Jackson is a 75 year old female here today for follow-up visit.  She reports she is doing well at this time.  She has having increased pain related to carpal tunnel.  She had seen Dr. Amedeo Plenty previously however ultimately decided not to have surgery.  She is requesting a referral back to him.  She is having cataract surgery tomorrow.  She reports her blood pressure at home has been doing well.  She is taking medications as directed without side effects.  She denies symptoms including chest pain, shortness of breath, palpitations, headache or vision changes.  She continues to see cardiology for management of her A. fib.  COPD remains well controlled with current inhalers.  Denies increased dyspnea or wheezing.   ROS:  A comprehensive ROS was completed and negative except as noted per HPI   Allergies  Allergen Reactions   Corticosteroids Other (See Comments)    Suicidal   Prochlorperazine Other (See Comments)    Acute dystonic reaction type   Eggs Or Egg-Derived Products Nausea And Vomiting   Nickel Rash    skin   Penicillins Rash    Past Medical History:  Diagnosis Date   Alcohol abuse    COPD (chronic obstructive pulmonary disease) (HCC)    Depression    Hypertension    OSA (obstructive sleep apnea)    Reflux     Past Surgical History:  Procedure Laterality Date   ABDOMINAL HYSTERECTOMY     APPENDECTOMY     75 yo   BLADDER SURGERY  2015   CHOLECYSTECTOMY     REPLACEMENT TOTAL KNEE     ROTATOR CUFF REPAIR     TONSILLECTOMY      Social History   Socioeconomic History   Marital status: Married    Spouse name: Not on file   Number of children: 3   Years of education: Not on file   Highest education level: Not on file  Occupational History   Not on file  Tobacco Use   Smoking status: Never   Smokeless tobacco: Never   Vaping Use   Vaping Use: Never used  Substance and Sexual Activity   Alcohol use: No    Alcohol/week: 0.0 standard drinks   Drug use: No   Sexual activity: Not on file  Other Topics Concern   Not on file  Social History Narrative   Not on file   Social Determinants of Health   Financial Resource Strain: Not on file  Food Insecurity: Not on file  Transportation Needs: Not on file  Physical Activity: Not on file  Stress: Not on file  Social Connections: Not on file    Family History  Problem Relation Age of Onset   Alcohol abuse Mother    Atrial fibrillation Father     Health Maintenance  Topic Date Due   Zoster Vaccines- Shingrix (1 of 2) Never done   COVID-19 Vaccine (3 - Pfizer risk series) 11/23/2019   INFLUENZA VACCINE  10/19/2021 (Originally 02/19/2021)   COLONOSCOPY (Pts 45-58yrs Insurance coverage will need to be confirmed)  09/16/2022   TETANUS/TDAP  11/19/2024   Pneumonia Vaccine 82+ Years old  Completed   DEXA SCAN  Completed   Hepatitis C Screening  Completed   HPV VACCINES  Aged Out     ----------------------------------------------------------------------------------------------------------------------------------------------------------------------------------------------------------------- Physical Exam BP (!) 147/48 (BP Location:  Left Arm, Patient Position: Sitting, Cuff Size: Large)    Pulse 69    Temp (!) 97.5 F (36.4 C)    Ht 5\' 4"  (1.626 m)    Wt 204 lb (92.5 kg)    SpO2 99%    BMI 35.02 kg/m   Physical Exam Constitutional:      Appearance: Normal appearance.  Eyes:     General: No scleral icterus. Cardiovascular:     Rate and Rhythm: Normal rate and regular rhythm.  Neurological:     General: No focal deficit present.     Mental Status: She is alert.  Psychiatric:        Mood and Affect: Mood normal.        Behavior: Behavior normal.     ------------------------------------------------------------------------------------------------------------------------------------------------------------------------------------------------------------------- Assessment and Plan  HTN (hypertension) Blood pressure mildly elevated today however readings at home are better controlled.  She will continue current medications for management of hypertension.  Low-sodium diet encouraged.  Paroxysmal a-fib (HCC) Rate controlled at this time.  She will continue to see cardiology as well.  COPD (chronic obstructive pulmonary disease) (HCC) Stable with current inhalers.  Encouraged continuation.  Carpal tunnel syndrome Referral placed back to Dr. Amedeo Plenty.  Prediabetes Recheck A1c today.   Meds ordered this encounter  Medications   losartan (COZAAR) 100 MG tablet    Sig: TAKE 1 TABLET(100 MG) BY MOUTH DAILY    Dispense:  90 tablet    Refill:  3   metoprolol succinate (TOPROL-XL) 100 MG 24 hr tablet    Sig: Take 1 tablet (100 mg total) by mouth daily. Take with or immediately following a meal.    Dispense:  90 tablet    Refill:  3   triamterene-hydrochlorothiazide (MAXZIDE-25) 37.5-25 MG tablet    Sig: Take 1 tablet by mouth daily.    Dispense:  90 tablet    Refill:  0    Return in about 6 months (around 01/07/2022) for HTN.    This visit occurred during the SARS-CoV-2 public health emergency.  Safety protocols were in place, including screening questions prior to the visit, additional usage of staff PPE, and extensive cleaning of exam room while observing appropriate contact time as indicated for disinfecting solutions.

## 2021-07-09 NOTE — Assessment & Plan Note (Signed)
Rate controlled at this time.  She will continue to see cardiology as well.

## 2021-07-09 NOTE — Assessment & Plan Note (Signed)
Recheck A1c today 

## 2021-07-09 NOTE — Assessment & Plan Note (Signed)
Stable with current inhalers.  Encouraged continuation.

## 2021-07-09 NOTE — Assessment & Plan Note (Signed)
Referral placed back to Dr. Amedeo Plenty.

## 2021-07-10 DIAGNOSIS — H25811 Combined forms of age-related cataract, right eye: Secondary | ICD-10-CM | POA: Diagnosis not present

## 2021-07-10 DIAGNOSIS — G4730 Sleep apnea, unspecified: Secondary | ICD-10-CM | POA: Diagnosis not present

## 2021-07-10 DIAGNOSIS — M150 Primary generalized (osteo)arthritis: Secondary | ICD-10-CM | POA: Diagnosis not present

## 2021-07-10 DIAGNOSIS — I10 Essential (primary) hypertension: Secondary | ICD-10-CM | POA: Diagnosis not present

## 2021-07-10 DIAGNOSIS — J449 Chronic obstructive pulmonary disease, unspecified: Secondary | ICD-10-CM | POA: Diagnosis not present

## 2021-07-10 DIAGNOSIS — H43813 Vitreous degeneration, bilateral: Secondary | ICD-10-CM | POA: Diagnosis not present

## 2021-07-10 DIAGNOSIS — H52223 Regular astigmatism, bilateral: Secondary | ICD-10-CM | POA: Diagnosis not present

## 2021-07-10 DIAGNOSIS — H02831 Dermatochalasis of right upper eyelid: Secondary | ICD-10-CM | POA: Diagnosis not present

## 2021-07-10 DIAGNOSIS — H25813 Combined forms of age-related cataract, bilateral: Secondary | ICD-10-CM | POA: Diagnosis not present

## 2021-07-10 DIAGNOSIS — H18513 Endothelial corneal dystrophy, bilateral: Secondary | ICD-10-CM | POA: Diagnosis not present

## 2021-07-10 DIAGNOSIS — H02834 Dermatochalasis of left upper eyelid: Secondary | ICD-10-CM | POA: Diagnosis not present

## 2021-07-10 DIAGNOSIS — Z9049 Acquired absence of other specified parts of digestive tract: Secondary | ICD-10-CM | POA: Diagnosis not present

## 2021-07-10 DIAGNOSIS — K219 Gastro-esophageal reflux disease without esophagitis: Secondary | ICD-10-CM | POA: Diagnosis not present

## 2021-09-06 DIAGNOSIS — M13841 Other specified arthritis, right hand: Secondary | ICD-10-CM | POA: Diagnosis not present

## 2021-09-06 DIAGNOSIS — G5603 Carpal tunnel syndrome, bilateral upper limbs: Secondary | ICD-10-CM | POA: Diagnosis not present

## 2021-09-06 DIAGNOSIS — I7300 Raynaud's syndrome without gangrene: Secondary | ICD-10-CM | POA: Diagnosis not present

## 2021-09-06 DIAGNOSIS — M13849 Other specified arthritis, unspecified hand: Secondary | ICD-10-CM | POA: Diagnosis not present

## 2021-09-08 DIAGNOSIS — I7300 Raynaud's syndrome without gangrene: Secondary | ICD-10-CM | POA: Insufficient documentation

## 2021-09-08 DIAGNOSIS — M19049 Primary osteoarthritis, unspecified hand: Secondary | ICD-10-CM | POA: Insufficient documentation

## 2021-10-03 ENCOUNTER — Telehealth: Payer: Self-pay

## 2021-10-03 DIAGNOSIS — I480 Paroxysmal atrial fibrillation: Secondary | ICD-10-CM | POA: Diagnosis not present

## 2021-10-03 DIAGNOSIS — R7303 Prediabetes: Secondary | ICD-10-CM | POA: Diagnosis not present

## 2021-10-03 DIAGNOSIS — E559 Vitamin D deficiency, unspecified: Secondary | ICD-10-CM | POA: Diagnosis not present

## 2021-10-03 DIAGNOSIS — I10 Essential (primary) hypertension: Secondary | ICD-10-CM | POA: Diagnosis not present

## 2021-10-03 DIAGNOSIS — Z13220 Encounter for screening for lipoid disorders: Secondary | ICD-10-CM | POA: Diagnosis not present

## 2021-10-03 NOTE — Telephone Encounter (Signed)
Pt lvm requesting lab draw times and locations.  ? ?Returned pt's call. LVM advising labs were ordered in December 2022. Still available. Provided office times and downstairs lab times.  ?

## 2021-10-04 ENCOUNTER — Other Ambulatory Visit: Payer: Self-pay | Admitting: Family Medicine

## 2021-10-04 LAB — LIPID PANEL W/REFLEX DIRECT LDL
Cholesterol: 188 mg/dL (ref <200)
HDL: 55 mg/dL (ref >=50)
LDL Cholesterol (Calc): 108 mg/dL (calc) — ABNORMAL HIGH
Non-HDL Cholesterol (Calc): 133 mg/dL (calc) — ABNORMAL HIGH (ref <130)
Total CHOL/HDL Ratio: 3.4 (calc) (ref <5.0)
Triglycerides: 132 mg/dL (ref <150)

## 2021-10-04 LAB — VITAMIN D 25 HYDROXY (VIT D DEFICIENCY, FRACTURES): Vit D, 25-Hydroxy: 15 ng/mL — ABNORMAL LOW (ref 30–100)

## 2021-10-04 LAB — COMPLETE METABOLIC PANEL WITH GFR
AG Ratio: 1.8 (calc) (ref 1.0–2.5)
ALT: 11 U/L (ref 6–29)
AST: 16 U/L (ref 10–35)
Albumin: 4.3 g/dL (ref 3.6–5.1)
Alkaline phosphatase (APISO): 114 U/L (ref 37–153)
BUN/Creatinine Ratio: 21 (calc) (ref 6–22)
BUN: 23 mg/dL (ref 7–25)
CO2: 28 mmol/L (ref 20–32)
Calcium: 8.9 mg/dL (ref 8.6–10.4)
Chloride: 103 mmol/L (ref 98–110)
Creat: 1.08 mg/dL — ABNORMAL HIGH (ref 0.60–1.00)
Globulin: 2.4 g/dL (calc) (ref 1.9–3.7)
Glucose, Bld: 94 mg/dL (ref 65–99)
Potassium: 3.8 mmol/L (ref 3.5–5.3)
Sodium: 140 mmol/L (ref 135–146)
Total Bilirubin: 0.5 mg/dL (ref 0.2–1.2)
Total Protein: 6.7 g/dL (ref 6.1–8.1)
eGFR: 53 mL/min/{1.73_m2} — ABNORMAL LOW (ref >=60)

## 2021-10-04 LAB — CBC WITH DIFFERENTIAL/PLATELET
Absolute Monocytes: 398 cells/uL (ref 200–950)
Basophils Absolute: 22 cells/uL (ref 0–200)
Basophils Relative: 0.4 %
Eosinophils Absolute: 101 cells/uL (ref 15–500)
Eosinophils Relative: 1.8 %
HCT: 37 % (ref 35.0–45.0)
Hemoglobin: 12.6 g/dL (ref 11.7–15.5)
Lymphs Abs: 1596 cells/uL (ref 850–3900)
MCH: 31 pg (ref 27.0–33.0)
MCHC: 34.1 g/dL (ref 32.0–36.0)
MCV: 90.9 fL (ref 80.0–100.0)
MPV: 10.6 fL (ref 7.5–12.5)
Monocytes Relative: 7.1 %
Neutro Abs: 3483 cells/uL (ref 1500–7800)
Neutrophils Relative %: 62.2 %
Platelets: 281 10*3/uL (ref 140–400)
RBC: 4.07 10*6/uL (ref 3.80–5.10)
RDW: 12.8 % (ref 11.0–15.0)
Total Lymphocyte: 28.5 %
WBC: 5.6 10*3/uL (ref 3.8–10.8)

## 2021-10-04 LAB — HEMOGLOBIN A1C
Hgb A1c MFr Bld: 5.5 % of total Hgb (ref <5.7)
Mean Plasma Glucose: 111 mg/dL
eAG (mmol/L): 6.2 mmol/L

## 2021-10-04 LAB — TSH: TSH: 4.41 mIU/L (ref 0.40–4.50)

## 2021-10-04 MED ORDER — VITAMIN D (ERGOCALCIFEROL) 1.25 MG (50000 UNIT) PO CAPS: 50000.0000 [IU] | ORAL_CAPSULE | ORAL | 1 refills | Status: DC

## 2021-10-07 ENCOUNTER — Other Ambulatory Visit: Payer: Self-pay | Admitting: Family Medicine

## 2021-10-09 DIAGNOSIS — G5602 Carpal tunnel syndrome, left upper limb: Secondary | ICD-10-CM | POA: Diagnosis not present

## 2021-10-24 DIAGNOSIS — R29898 Other symptoms and signs involving the musculoskeletal system: Secondary | ICD-10-CM | POA: Diagnosis not present

## 2021-11-07 DIAGNOSIS — R29898 Other symptoms and signs involving the musculoskeletal system: Secondary | ICD-10-CM | POA: Diagnosis not present

## 2021-11-19 ENCOUNTER — Other Ambulatory Visit: Payer: Self-pay | Admitting: *Deleted

## 2021-11-19 DIAGNOSIS — I340 Nonrheumatic mitral (valve) insufficiency: Secondary | ICD-10-CM

## 2021-11-26 ENCOUNTER — Ambulatory Visit (HOSPITAL_COMMUNITY): Payer: Medicare Other | Attending: Cardiology

## 2021-11-26 DIAGNOSIS — I340 Nonrheumatic mitral (valve) insufficiency: Secondary | ICD-10-CM | POA: Diagnosis not present

## 2021-11-26 LAB — ECHOCARDIOGRAM COMPLETE
Area-P 1/2: 5.16 cm2
MV M vel: 3.71 m/s
MV Peak grad: 55.1 mmHg
S' Lateral: 2.5 cm

## 2022-01-07 ENCOUNTER — Ambulatory Visit: Payer: Medicare Other | Admitting: Family Medicine

## 2022-02-01 ENCOUNTER — Other Ambulatory Visit: Payer: Self-pay | Admitting: Family Medicine

## 2022-02-06 ENCOUNTER — Encounter: Payer: Self-pay | Admitting: Family Medicine

## 2022-02-06 ENCOUNTER — Ambulatory Visit (INDEPENDENT_AMBULATORY_CARE_PROVIDER_SITE_OTHER): Payer: Medicare Other | Admitting: Family Medicine

## 2022-02-06 VITALS — BP 116/71 | HR 60 | Temp 98.2°F | Ht 64.0 in | Wt 198.0 lb

## 2022-02-06 DIAGNOSIS — I480 Paroxysmal atrial fibrillation: Secondary | ICD-10-CM | POA: Diagnosis not present

## 2022-02-06 DIAGNOSIS — M79673 Pain in unspecified foot: Secondary | ICD-10-CM | POA: Insufficient documentation

## 2022-02-06 DIAGNOSIS — I10 Essential (primary) hypertension: Secondary | ICD-10-CM

## 2022-02-06 DIAGNOSIS — E559 Vitamin D deficiency, unspecified: Secondary | ICD-10-CM

## 2022-02-06 DIAGNOSIS — M79672 Pain in left foot: Secondary | ICD-10-CM

## 2022-02-06 DIAGNOSIS — J449 Chronic obstructive pulmonary disease, unspecified: Secondary | ICD-10-CM

## 2022-02-06 MED ORDER — MELOXICAM 7.5 MG PO TABS: ORAL_TABLET | ORAL | 0 refills | Status: DC

## 2022-02-06 NOTE — Assessment & Plan Note (Signed)
Unfortunately she took her weekly dose of daily.  Updated vitamin D level today.

## 2022-02-06 NOTE — Assessment & Plan Note (Addendum)
Trial of meloxicam.  Given handout on eccentric Achilles stretches..  If no improvement with this we can consider trial of topical nitroglycerin.

## 2022-02-06 NOTE — Progress Notes (Signed)
Kimberly Jackson - 76 y.o. female MRN [id number]  Date of birth: July 04, 1946  Subjective Chief Complaint  Patient presents with   Hypertension    HPI Kimberly Jackson is a 76 year old female here today for follow-up visit.  She reports overall she is feeling well.  She has been seeing cardiology for history of atrial fibrillation.  Continues on metoprolol for rate control.  Additionally she is on losartan for blood pressure control.  She denies any palpitations, shortness of breath increased fatigue.  COPD remains well controlled with current inhalers.  Advair and Spiriva daily.  She has not needed rescue inhaler recently.  Vitamin D was low previously.  She did pick up prescription for high-dose vitamin D however took 1 of these daily instead of weekly.   She is having some pain at the back of her left heel.  Does not recall any injury.  Pain is worse with walking.  Following.  She has not tried thing to help with the symptoms.  ROS:  A comprehensive ROS was completed and negative except as noted per HPI    Allergies  Allergen Reactions   Corticosteroids Other (See Comments)    Suicidal   Prochlorperazine Other (See Comments)    Acute dystonic reaction type   Cortisone    Eggs Or Egg-Derived Products Nausea And Vomiting   Nickel Rash    skin   Penicillins Rash    Past Medical History:  Diagnosis Date   Alcohol abuse    COPD (chronic obstructive pulmonary disease) (HCC)    Depression    Hypertension    OSA (obstructive sleep apnea)    Reflux     Past Surgical History:  Procedure Laterality Date   ABDOMINAL HYSTERECTOMY     APPENDECTOMY     76 yo   BLADDER SURGERY  2015   CHOLECYSTECTOMY     REPLACEMENT TOTAL KNEE     ROTATOR CUFF REPAIR     TONSILLECTOMY      Social History   Socioeconomic History   Marital status: Married    Spouse name: Not on file   Number of children: 3   Years of education: Not on file   Highest education level: Not on file  Occupational  History   Not on file  Tobacco Use   Smoking status: Never   Smokeless tobacco: Never  Vaping Use   Vaping Use: Never used  Substance and Sexual Activity   Alcohol use: No    Alcohol/week: 0.0 standard drinks of alcohol   Drug use: No   Sexual activity: Not on file  Other Topics Concern   Not on file  Social History Narrative   Not on file   Social Determinants of Health   Financial Resource Strain: Not on file  Food Insecurity: Not on file  Transportation Needs: Not on file  Physical Activity: Not on file  Stress: Not on file  Social Connections: Not on file    Family History  Problem Relation Age of Onset   Alcohol abuse Mother    Atrial fibrillation Father     Health Maintenance  Topic Date Due   Zoster Vaccines- Shingrix (1 of 2) Never done   COVID-19 Vaccine (3 - Pfizer risk series) 11/23/2019   INFLUENZA VACCINE  02/19/2022   COLONOSCOPY (Pts 45-5yr Insurance coverage will need to be confirmed)  09/16/2022   TETANUS/TDAP  11/19/2024   Pneumonia Vaccine 76 Years old  Completed   DEXA SCAN  Completed   Hepatitis C  Screening  Completed   HPV VACCINES  Aged Out     ----------------------------------------------------------------------------------------------------------------------------------------------------------------------------------------------------------------- Physical Exam BP 116/71 (BP Location: Left Arm, Patient Position: Sitting, Cuff Size: Large)   Pulse 60   Temp 98.2 F (36.8 C) (Oral)   Ht '5\' 4"'$  (1.626 m)   Wt 198 lb 0.6 oz (89.8 kg)   SpO2 99%   BMI 33.99 kg/m   Physical Exam Constitutional:      Appearance: Normal appearance.  Eyes:     General: No scleral icterus. Cardiovascular:     Rate and Rhythm: Normal rate and regular rhythm.  Pulmonary:     Effort: Pulmonary effort is normal.     Breath sounds: Normal breath sounds.  Musculoskeletal:     Cervical back: Neck supple.     Comments: Pain at posterior calcaneus.     Neurological:     Mental Status: She is alert.  Psychiatric:        Mood and Affect: Mood normal.        Behavior: Behavior normal.     ------------------------------------------------------------------------------------------------------------------------------------------------------------------------------------------------------------------- Assessment and Plan  HTN (hypertension) Blood pressure is mildly elevated however has not taken losartan yet today.  She will take this when she returns home.  Recommend continuation of this in addition to [facility] and metoprolol.  Paroxysmal a-fib (HCC) Rate control with metoprolol.  Recommend that she continue to see cardiology regularly as well.  COPD (chronic obstructive pulmonary disease) (HCC) Stable symptoms with current inhalers.  We will plan to continue these.  Vitamin D deficiency Unfortunately she took her weekly dose of daily.  Updated vitamin D level today.  Heel pain Trial of meloxicam.  Given handout on eccentric Achilles stretches..  If no improvement with this we can consider trial of topical nitroglycerin.   Meds ordered this encounter  Medications   meloxicam (MOBIC) 7.5 MG tablet    Sig: Take daily x 2 weeks then as needed. Take with food    Dispense:  30 tablet    Refill:  0    Return in about 6 months (around 08/09/2022) for HTN/COPD.    This visit occurred during the SARS-CoV-2 public health emergency.  Safety protocols were in place, including screening questions prior to the visit, additional usage of staff PPE, and extensive cleaning of exam room while observing appropriate contact time as indicated for disinfecting solutions.

## 2022-02-06 NOTE — Patient Instructions (Signed)
Try anti-inflammatory and stretches for heel pain.  See me again in 6 months.

## 2022-02-06 NOTE — Assessment & Plan Note (Signed)
Stable symptoms with current inhalers.  We will plan to continue these.

## 2022-02-06 NOTE — Assessment & Plan Note (Signed)
Blood pressure is mildly elevated however has not taken losartan yet today.  She will take this when she returns home.  Recommend continuation of this in addition to [facility] and metoprolol.

## 2022-02-06 NOTE — Assessment & Plan Note (Signed)
Rate control with metoprolol.  Recommend that she continue to see cardiology regularly as well.

## 2022-02-07 LAB — VITAMIN D 25 HYDROXY (VIT D DEFICIENCY, FRACTURES): Vit D, 25-Hydroxy: 37 ng/mL (ref 30–100)

## 2022-02-18 ENCOUNTER — Other Ambulatory Visit: Payer: Self-pay | Admitting: Family Medicine

## 2022-05-06 ENCOUNTER — Other Ambulatory Visit: Payer: Self-pay | Admitting: Family Medicine

## 2022-05-06 ENCOUNTER — Telehealth: Payer: Self-pay | Admitting: Cardiology

## 2022-05-06 DIAGNOSIS — Z1231 Encounter for screening mammogram for malignant neoplasm of breast: Secondary | ICD-10-CM

## 2022-05-06 NOTE — Telephone Encounter (Signed)
Patient stated she has had leg cramps for years, but now they occur stronger and more often in her calves and thighs. Recommended she call her PCP. She said she is taking husband to PCP tomorrow and will call today to advise PCP of leg cramps and to ask for blood work.

## 2022-05-06 NOTE — Telephone Encounter (Signed)
Pt is requesting call back to discuss leg cramps she is having. She says that she has been drinking plenty of water, but wants to make sure it isn't due to her heart. Please advise.

## 2022-05-13 ENCOUNTER — Encounter: Payer: Self-pay | Admitting: Family Medicine

## 2022-05-13 ENCOUNTER — Ambulatory Visit (INDEPENDENT_AMBULATORY_CARE_PROVIDER_SITE_OTHER): Payer: Medicare Other | Admitting: Family Medicine

## 2022-05-13 VITALS — BP 128/78 | HR 59 | Resp 20 | Ht 64.0 in | Wt 196.0 lb

## 2022-05-13 DIAGNOSIS — E612 Magnesium deficiency: Secondary | ICD-10-CM

## 2022-05-13 DIAGNOSIS — Z23 Encounter for immunization: Secondary | ICD-10-CM

## 2022-05-13 DIAGNOSIS — R0989 Other specified symptoms and signs involving the circulatory and respiratory systems: Secondary | ICD-10-CM

## 2022-05-13 DIAGNOSIS — R252 Cramp and spasm: Secondary | ICD-10-CM

## 2022-05-13 DIAGNOSIS — R799 Abnormal finding of blood chemistry, unspecified: Secondary | ICD-10-CM | POA: Diagnosis not present

## 2022-05-13 DIAGNOSIS — E559 Vitamin D deficiency, unspecified: Secondary | ICD-10-CM | POA: Diagnosis not present

## 2022-05-13 MED ORDER — VITAMIN D (ERGOCALCIFEROL) 1.25 MG (50000 UNIT) PO CAPS: 50000.0000 [IU] | ORAL_CAPSULE | ORAL | 1 refills | Status: DC

## 2022-05-13 NOTE — Progress Notes (Signed)
Kimberly Jackson - 76 y.o. female MRN [id number]  Date of birth: 11/30/1945  Subjective Chief Complaint  Patient presents with   leg cramps    HPI Kimberly Jackson is a 76 year old female here today with complaint of bilateral leg cramping.  She has had this for quite some time.  Leg cramping is worse at night.  They do occur intermittently.  She does describe these as severe when they do occur.  She denies any numbness or tingling in her legs.  No significant pain when walking.  She does only drink about 36 to 48 ounces of fluid each day.  ROS:  A comprehensive ROS was completed and negative except as noted per HPI  Allergies  Allergen Reactions   Corticosteroids Other (See Comments)    Suicidal   Prochlorperazine Other (See Comments)    Acute dystonic reaction type   Cortisone    Eggs Or Egg-Derived Products Nausea And Vomiting   Nickel Rash    skin   Penicillins Rash    Past Medical History:  Diagnosis Date   Alcohol abuse    COPD (chronic obstructive pulmonary disease) (HCC)    Depression    Hypertension    OSA (obstructive sleep apnea)    Reflux     Past Surgical History:  Procedure Laterality Date   ABDOMINAL HYSTERECTOMY     APPENDECTOMY     76 yo   BLADDER SURGERY  2015   CHOLECYSTECTOMY     REPLACEMENT TOTAL KNEE     ROTATOR CUFF REPAIR     TONSILLECTOMY      Social History   Socioeconomic History   Marital status: Married    Spouse name: Not on file   Number of children: 3   Years of education: Not on file   Highest education level: Not on file  Occupational History   Not on file  Tobacco Use   Smoking status: Never   Smokeless tobacco: Never  Vaping Use   Vaping Use: Never used  Substance and Sexual Activity   Alcohol use: No    Alcohol/week: 0.0 standard drinks of alcohol   Drug use: No   Sexual activity: Not on file  Other Topics Concern   Not on file  Social History Narrative   Not on file   Social Determinants of Health   Financial Resource  Strain: Not on file  Food Insecurity: Not on file  Transportation Needs: Not on file  Physical Activity: Not on file  Stress: Not on file  Social Connections: Not on file    Family History  Problem Relation Age of Onset   Alcohol abuse Mother    Atrial fibrillation Father     Health Maintenance  Topic Date Due   COVID-19 Vaccine (3 - Pfizer risk series) 05/29/2022 (Originally 11/23/2019)   Zoster Vaccines- Shingrix (1 of 2) 08/13/2022 (Originally 10/23/1962)   COLONOSCOPY (Pts 45-74yr Insurance coverage will need to be confirmed)  09/16/2022   TETANUS/TDAP  11/19/2024   Pneumonia Vaccine 76 Years old  Completed   INFLUENZA VACCINE  Completed   DEXA SCAN  Completed   Hepatitis C Screening  Completed   HPV VACCINES  Aged Out     ----------------------------------------------------------------------------------------------------------------------------------------------------------------------------------------------------------------- Physical Exam BP 128/78 (BP Location: Left Arm, Cuff Size: Normal)   Pulse (!) 59   Resp 20   Ht '5\' 4"'$  (1.626 m)   Wt 196 lb (88.9 kg)   SpO2 99%   BMI 33.64 kg/m   Physical Exam Constitutional:  Appearance: Normal appearance.  Eyes:     General: No scleral icterus. Cardiovascular:     Rate and Rhythm: Normal rate and regular rhythm.     Comments: Diminished dorsalis pedis pulses bilaterally. Musculoskeletal:     Cervical back: Neck supple.  Neurological:     Mental Status: She is alert.  Psychiatric:        Mood and Affect: Mood normal.        Behavior: Behavior normal.     ------------------------------------------------------------------------------------------------------------------------------------------------------------------------------------------------------------------- Assessment and Plan  Leg cramps Orders Placed This Encounter  Procedures   Flu Vaccine MDCK QUAD PF   COMPLETE METABOLIC PANEL WITH GFR    Magnesium   Vitamin D (25 hydroxy)   Iron, TIBC and Ferritin Panel  Updating labs today.  She does have diminished dorsalis pedis pulses bilaterally.  Sending for ABIs, however this would be somewhat atypical presentation for claudication.   Meds ordered this encounter  Medications   Vitamin D, Ergocalciferol, (DRISDOL) 1.25 MG (50000 UNIT) CAPS capsule    Sig: Take 1 capsule (50,000 Units total) by mouth every 7 (seven) days.    Dispense:  12 capsule    Refill:  1    No follow-ups on file.    This visit occurred during the SARS-CoV-2 public health emergency.  Safety protocols were in place, including screening questions prior to the visit, additional usage of staff PPE, and extensive cleaning of exam room while observing appropriate contact time as indicated for disinfecting solutions.

## 2022-05-13 NOTE — Assessment & Plan Note (Signed)
Orders Placed This Encounter  Procedures  . Flu Vaccine MDCK QUAD PF  . COMPLETE METABOLIC PANEL WITH GFR  . Magnesium  . Vitamin D (25 hydroxy)  . Iron, TIBC and Ferritin Panel  Updating labs today.  She does have diminished dorsalis pedis pulses bilaterally.  Sending for ABIs, however this would be somewhat atypical presentation for claudication.

## 2022-05-14 LAB — COMPLETE METABOLIC PANEL WITH GFR
AG Ratio: 1.6 (calc) (ref 1.0–2.5)
ALT: 14 U/L (ref 6–29)
AST: 18 U/L (ref 10–35)
Albumin: 4.4 g/dL (ref 3.6–5.1)
Alkaline phosphatase (APISO): 114 U/L (ref 37–153)
BUN/Creatinine Ratio: 18 (calc) (ref 6–22)
BUN: 18 mg/dL (ref 7–25)
CO2: 30 mmol/L (ref 20–32)
Calcium: 9.7 mg/dL (ref 8.6–10.4)
Chloride: 104 mmol/L (ref 98–110)
Creat: 1.01 mg/dL — ABNORMAL HIGH (ref 0.60–1.00)
Globulin: 2.8 g/dL (calc) (ref 1.9–3.7)
Glucose, Bld: 90 mg/dL (ref 65–99)
Potassium: 5 mmol/L (ref 3.5–5.3)
Sodium: 142 mmol/L (ref 135–146)
Total Bilirubin: 0.6 mg/dL (ref 0.2–1.2)
Total Protein: 7.2 g/dL (ref 6.1–8.1)
eGFR: 57 mL/min/{1.73_m2} — ABNORMAL LOW (ref >=60)

## 2022-05-14 LAB — IRON,TIBC AND FERRITIN PANEL
%SAT: 25 % (calc) (ref 16–45)
Ferritin: 217 ng/mL (ref 16–288)
Iron: 84 ug/dL (ref 45–160)
TIBC: 337 mcg/dL (calc) (ref 250–450)

## 2022-05-14 LAB — VITAMIN D 25 HYDROXY (VIT D DEFICIENCY, FRACTURES): Vit D, 25-Hydroxy: 31 ng/mL (ref 30–100)

## 2022-05-14 LAB — MAGNESIUM: Magnesium: 1.4 mg/dL — ABNORMAL LOW (ref 1.5–2.5)

## 2022-05-20 ENCOUNTER — Ambulatory Visit (INDEPENDENT_AMBULATORY_CARE_PROVIDER_SITE_OTHER): Payer: Medicare Other | Admitting: Cardiology

## 2022-05-20 ENCOUNTER — Encounter: Payer: Self-pay | Admitting: Cardiology

## 2022-05-20 VITALS — BP 122/64 | HR 57 | Ht 63.5 in | Wt 199.4 lb

## 2022-05-20 DIAGNOSIS — R002 Palpitations: Secondary | ICD-10-CM | POA: Diagnosis not present

## 2022-05-20 DIAGNOSIS — I10 Essential (primary) hypertension: Secondary | ICD-10-CM

## 2022-05-20 DIAGNOSIS — I340 Nonrheumatic mitral (valve) insufficiency: Secondary | ICD-10-CM

## 2022-05-20 NOTE — Progress Notes (Signed)
HPI: FU atrial fibrillation.  Chest CT August 2020 showed stable pulmonary nodules and aortic atherosclerosis.  Patient apparently told by primary care in [place] in the past that she had paroxysmal atrial fibrillation but a cardiologist disagreed.  Echocardiogram May 2023 showed normal LV function, grade 2 diastolic dysfunction, moderate left atrial enlargement, mild mitral regurgitation, trace aortic insufficiency.  Since last seen, the patient has dyspnea with more extreme activities but not with routine activities. It is relieved with rest. It is not associated with chest pain. There is no orthopnea, PND or pedal edema. There is no syncope or palpitations. There is no exertional chest pain.   Current Outpatient Medications  Medication Sig Dispense Refill   AMBULATORY NON FORMULARY MEDICATION Blood pressure monitor use daily for hypertension. 1 Device 0   diclofenac Sodium (VOLTAREN) 1 % GEL APPLY 2 GRAMS TOPICALLY TO THE AFFECTED JOINT FOUR TIMES DAILY 600 g 3   fluticasone-salmeterol (ADVAIR HFA) 230-21 MCG/ACT inhaler Inhale 2 puffs into the lungs 2 (two) times daily. 12 g 1   Ipratropium-Albuterol (COMBIVENT) 20-100 MCG/ACT AERS respimat Inhale 1 puff into the lungs every 6 (six) hours as needed for wheezing or shortness of breath. 4 g 5   losartan (COZAAR) 100 MG tablet TAKE 1 TABLET(100 MG) BY MOUTH DAILY 90 tablet 3   metoprolol succinate (TOPROL-XL) 100 MG 24 hr tablet Take 1 tablet (100 mg total) by mouth daily. Take with or immediately following a meal. 90 tablet 3   omeprazole (PRILOSEC) 40 MG capsule TAKE 1 CAPSULE(40 MG) BY MOUTH DAILY 90 capsule 3   triamterene-hydrochlorothiazide (MAXZIDE-25) 37.5-25 MG tablet TAKE 1 TABLET BY MOUTH DAILY 90 tablet 1   Vitamin D, Ergocalciferol, (DRISDOL) 1.25 MG (50000 UNIT) CAPS capsule Take 1 capsule (50,000 Units total) by mouth every 7 (seven) days. 12 capsule 1   No current facility-administered medications for this visit.      Past Medical History:  Diagnosis Date   Alcohol abuse    COPD (chronic obstructive pulmonary disease) (HCC)    Depression    Hypertension    OSA (obstructive sleep apnea)    Reflux     Past Surgical History:  Procedure Laterality Date   ABDOMINAL HYSTERECTOMY     APPENDECTOMY     76 yo   BLADDER SURGERY  2015   CHOLECYSTECTOMY     REPLACEMENT TOTAL KNEE     ROTATOR CUFF REPAIR     TONSILLECTOMY      Social History   Socioeconomic History   Marital status: Married    Spouse name: Not on file   Number of children: 3   Years of education: Not on file   Highest education level: Not on file  Occupational History   Not on file  Tobacco Use   Smoking status: Never   Smokeless tobacco: Never  Vaping Use   Vaping Use: Never used  Substance and Sexual Activity   Alcohol use: No    Alcohol/week: 0.0 standard drinks of alcohol   Drug use: No   Sexual activity: Not on file  Other Topics Concern   Not on file  Social History Narrative   Not on file   Social Determinants of Health   Financial Resource Strain: Not on file  Food Insecurity: Not on file  Transportation Needs: Not on file  Physical Activity: Not on file  Stress: Not on file  Social Connections: Not on file  Intimate Partner Violence: Not on file  Family History  Problem Relation Age of Onset   Alcohol abuse Mother    Atrial fibrillation Father     ROS: no fevers or chills, productive cough, hemoptysis, dysphasia, odynophagia, melena, hematochezia, dysuria, hematuria, rash, seizure activity, orthopnea, PND, pedal edema, claudication. Remaining systems are negative.  Physical Exam: Well-developed well-nourished in no acute distress.  Skin is warm and dry.  HEENT is normal.  Neck is supple.  Chest is clear to auscultation with normal expansion.  Cardiovascular exam is regular rate and rhythm.  Abdominal exam nontender or distended. No masses palpated. Extremities show no edema. neuro  grossly intact  ECG-sinus bradycardia at a rate of 57, no ST changes.  Personally reviewed  A/P  1 question history of atrial fibrillation-I have no records to document this.  We will continue beta-blocker at present.   2 palpitations-etiology unclear.  We again discussed purchasing an Apple Watch/obtaining a rhythm strip at time of symptoms.  If atrial fibrillation were documented she would need anticoagulation.  3 hypertension-patient's blood pressure is controlled.  Continue present medical regimen.  4 history of obstructive sleep apnea-managed by pulmonary.  5 mitral regurgitation-mild on most recent echocardiogram.  Kirk Ruths, MD

## 2022-05-20 NOTE — Patient Instructions (Signed)
  Follow-Up: At [facility], you and your health needs are our priority.  As part of our continuing mission to provide you with exceptional heart care, we have created designated Provider Care Teams.  These Care Teams include your primary Cardiologist (physician) and Advanced Practice Providers (APPs -  Physician Assistants and Nurse Practitioners) who all work together to provide you with the care you need, when you need it.  We recommend signing up for the patient portal called "MyChart".  Sign up information is provided on this After Visit Summary.  MyChart is used to connect with patients for Virtual Visits (Telemedicine).  Patients are able to view lab/test results, encounter notes, upcoming appointments, etc.  Non-urgent messages can be sent to your provider as well.   To learn more about what you can do with MyChart, go to [email or web address].    Your next appointment:   12 month(s)  The format for your next appointment:   In Person  Provider:   Brian Crenshaw, MD   

## 2022-05-22 ENCOUNTER — Ambulatory Visit (HOSPITAL_COMMUNITY)
Admission: RE | Admit: 2022-05-22 | Discharge: 2022-05-22 | Disposition: A | Discharge status: Home / Self Care | Payer: Medicare Other | Source: Ambulatory Visit | Attending: Family Medicine | Admitting: Family Medicine

## 2022-05-22 DIAGNOSIS — R252 Cramp and spasm: Secondary | ICD-10-CM | POA: Insufficient documentation

## 2022-05-22 DIAGNOSIS — R0989 Other specified symptoms and signs involving the circulatory and respiratory systems: Secondary | ICD-10-CM | POA: Diagnosis not present

## 2022-05-24 ENCOUNTER — Other Ambulatory Visit: Payer: Self-pay | Admitting: Family Medicine

## 2022-05-24 MED ORDER — MAGNESIUM CHLORIDE 64 MG PO TBEC: 1.0000 | DELAYED_RELEASE_TABLET | Freq: Every day | ORAL | 1 refills | Status: DC

## 2022-06-20 ENCOUNTER — Ambulatory Visit
Admission: RE | Admit: 2022-06-20 | Discharge: 2022-06-20 | Disposition: A | Discharge status: Home / Self Care | Payer: Medicare Other | Source: Ambulatory Visit | Attending: Family Medicine | Admitting: Family Medicine

## 2022-06-20 DIAGNOSIS — Z1231 Encounter for screening mammogram for malignant neoplasm of breast: Secondary | ICD-10-CM | POA: Diagnosis not present

## 2022-07-18 ENCOUNTER — Other Ambulatory Visit: Payer: Self-pay

## 2022-07-18 MED ORDER — LOSARTAN POTASSIUM 100 MG PO TABS: ORAL_TABLET | ORAL | 3 refills | Status: DC

## 2022-07-18 MED ORDER — METOPROLOL SUCCINATE ER 100 MG PO TB24: 100.0000 mg | ORAL_TABLET | Freq: Every day | ORAL | 3 refills | Status: DC

## 2022-08-03 ENCOUNTER — Telehealth: Payer: Self-pay | Admitting: Family Medicine

## 2022-08-03 MED ORDER — NIRMATRELVIR/RITONAVIR (PAXLOVID) TABLET (RENAL DOSING): 2.0000 | ORAL_TABLET | Freq: Two times a day (BID) | ORAL | 0 refills | Status: AC

## 2022-08-03 NOTE — Telephone Encounter (Signed)
On call nurse called and she now has tested pos for COVID. Her husband tested pos yestterday.    sent in Paxlovid - renal dose.   Meds ordered this encounter  Medications   nirmatrelvir/ritonavir, renal dosing, (PAXLOVID) 10 x 150 MG & 10 x '100MG'$  TABS    Sig: Take 2 tablets by mouth 2 (two) times daily for 5 days. (Take nirmatrelvir 150 mg one tablet twice daily for 5 days and ritonavir 100 mg one tablet twice daily for 5 days) Patient GFR is 57    Dispense:  20 tablet    Refill:  0

## 2022-08-05 ENCOUNTER — Ambulatory Visit: Payer: Medicare Other | Admitting: Cardiology

## 2022-08-12 ENCOUNTER — Ambulatory Visit (INDEPENDENT_AMBULATORY_CARE_PROVIDER_SITE_OTHER): Payer: Medicare Other | Admitting: Family Medicine

## 2022-08-12 ENCOUNTER — Encounter: Payer: Self-pay | Admitting: Family Medicine

## 2022-08-12 VITALS — BP 129/71 | HR 62 | Ht 63.5 in | Wt 198.0 lb

## 2022-08-12 DIAGNOSIS — M159 Polyosteoarthritis, unspecified: Secondary | ICD-10-CM | POA: Diagnosis not present

## 2022-08-12 DIAGNOSIS — R7303 Prediabetes: Secondary | ICD-10-CM | POA: Diagnosis not present

## 2022-08-12 DIAGNOSIS — I10 Essential (primary) hypertension: Secondary | ICD-10-CM

## 2022-08-12 DIAGNOSIS — J441 Chronic obstructive pulmonary disease with (acute) exacerbation: Secondary | ICD-10-CM

## 2022-08-12 DIAGNOSIS — I480 Paroxysmal atrial fibrillation: Secondary | ICD-10-CM

## 2022-08-12 NOTE — Assessment & Plan Note (Signed)
Stable at this time. Recommend continuation of current inhalers.

## 2022-08-12 NOTE — Assessment & Plan Note (Signed)
BP is well controlled.  Recommend continuation of current medications for management of HTN

## 2022-08-12 NOTE — Assessment & Plan Note (Signed)
She may continue voltaren gel as needed.

## 2022-08-12 NOTE — Assessment & Plan Note (Signed)
Update A1c ?

## 2022-08-12 NOTE — Assessment & Plan Note (Signed)
Continues to see cardiology.  Rate controlled metoprolol.

## 2022-08-12 NOTE — Progress Notes (Signed)
Kimberly Jackson - 77 y.o. female MRN [id number]  Date of birth: 1946/06/11  Subjective Chief Complaint  Patient presents with   Follow-up    HPI Kimberly Jackson is a 77 y.o. female here today for follow up visit.   She reports that she is doing pretty well.  She continues on losartan, metoprolol and maxzide for management of HTN.  BP is well controlled.  She denies symptoms related to HTN including chest pain, shortness of breath, palpitations, headache or vision changes.  She does continue to see cardiology as well.   COPD remains well controlled with daily advair and combivent as needed. She had COVID recently.  This was treated with Paxlovid.  She reports that she still has some mild congestion but denies dyspnea.   Using voltaren as needed for OA.  This is working well.   ROS:  A comprehensive ROS was completed and negative except as noted per HPI  Allergies  Allergen Reactions   Corticosteroids Other (See Comments)    Suicidal   Prochlorperazine Other (See Comments)    Acute dystonic reaction type   Cortisone    Eggs Or Egg-Derived Products Nausea And Vomiting   Nickel Rash    skin   Penicillins Rash    Past Medical History:  Diagnosis Date   Alcohol abuse    COPD (chronic obstructive pulmonary disease) (HCC)    Depression    Hypertension    OSA (obstructive sleep apnea)    Reflux     Past Surgical History:  Procedure Laterality Date   ABDOMINAL HYSTERECTOMY     APPENDECTOMY     77 yo   BLADDER SURGERY  2015   CHOLECYSTECTOMY     REPLACEMENT TOTAL KNEE     ROTATOR CUFF REPAIR     TONSILLECTOMY      Social History   Socioeconomic History   Marital status: Married    Spouse name: Not on file   Number of children: 3   Years of education: Not on file   Highest education level: Not on file  Occupational History   Not on file  Tobacco Use   Smoking status: Never   Smokeless tobacco: Never  Vaping Use   Vaping Use: Never used  Substance and Sexual  Activity   Alcohol use: No    Alcohol/week: 0.0 standard drinks of alcohol   Drug use: No   Sexual activity: Not on file  Other Topics Concern   Not on file  Social History Narrative   Not on file   Social Determinants of Health   Financial Resource Strain: Not on file  Food Insecurity: Not on file  Transportation Needs: Not on file  Physical Activity: Not on file  Stress: Not on file  Social Connections: Not on file    Family History  Problem Relation Age of Onset   Alcohol abuse Mother    Atrial fibrillation Father    Breast cancer Sister 54   Breast cancer Maternal Aunt    Breast cancer Paternal Aunt     Health Maintenance  Topic Date Due   Medicare Annual Wellness (AWV)  12/19/2018   Zoster Vaccines- Shingrix (1 of 2) 08/13/2022 (Originally 10/23/1964)   COVID-19 Vaccine (3 - Pfizer risk series) 08/19/2022 (Originally 11/23/2019)   COLONOSCOPY (Pts 45-34yr Insurance coverage will need to be confirmed)  09/16/2022   DTaP/Tdap/Td (2 - Td or Tdap) 11/19/2024   Pneumonia Vaccine 77 Years old  Completed   INFLUENZA VACCINE  Completed  DEXA SCAN  Completed   Hepatitis C Screening  Completed   HPV VACCINES  Aged Out     ----------------------------------------------------------------------------------------------------------------------------------------------------------------------------------------------------------------- Physical Exam BP 129/71 (BP Location: Left Arm, Patient Position: Sitting, Cuff Size: Large)   Pulse 62   Ht 5' 3.5" (1.613 m)   Wt 198 lb (89.8 kg)   SpO2 100%   BMI 34.52 kg/m   Physical Exam Constitutional:      Appearance: Normal appearance.  HENT:     Head: Normocephalic and atraumatic.  Eyes:     General: No scleral icterus. Cardiovascular:     Rate and Rhythm: Normal rate and regular rhythm.  Pulmonary:     Effort: Pulmonary effort is normal.     Breath sounds: Normal breath sounds.  Musculoskeletal:     Cervical back: Neck  supple.  Neurological:     Mental Status: She is alert.  Psychiatric:        Mood and Affect: Mood normal.        Behavior: Behavior normal.     ------------------------------------------------------------------------------------------------------------------------------------------------------------------------------------------------------------------- Assessment and Plan  Paroxysmal a-fib ([place]) Continues to see cardiology.  Rate controlled metoprolol.   HTN (hypertension) BP is well controlled.  Recommend continuation of current medications for management of HTN  COPD exacerbation ([place]) Stable at this time. Recommend continuation of current inhalers.   Osteoarthritis She may continue voltaren gel as needed.   Prediabetes Update A1c.    No orders of the defined types were placed in this encounter.   No follow-ups on file.    This visit occurred during the SARS-CoV-2 public health emergency.  Safety protocols were in place, including screening questions prior to the visit, additional usage of staff PPE, and extensive cleaning of exam room while observing appropriate contact time as indicated for disinfecting solutions.

## 2022-08-12 NOTE — Patient Instructions (Signed)
Great to see you! Continue current medications.

## 2022-08-13 LAB — CBC WITH DIFFERENTIAL/PLATELET
Absolute Monocytes: 422 cells/uL (ref 200–950)
Basophils Absolute: 19 cells/uL (ref 0–200)
Basophils Relative: 0.3 %
Eosinophils Absolute: 88 cells/uL (ref 15–500)
Eosinophils Relative: 1.4 %
HCT: 39.8 % (ref 35.0–45.0)
Hemoglobin: 13.6 g/dL (ref 11.7–15.5)
Lymphs Abs: 1814 cells/uL (ref 850–3900)
MCH: 31.3 pg (ref 27.0–33.0)
MCHC: 34.2 g/dL (ref 32.0–36.0)
MCV: 91.7 fL (ref 80.0–100.0)
MPV: 10 fL (ref 7.5–12.5)
Monocytes Relative: 6.7 %
Neutro Abs: 3956 cells/uL (ref 1500–7800)
Neutrophils Relative %: 62.8 %
Platelets: 316 10*3/uL (ref 140–400)
RBC: 4.34 10*6/uL (ref 3.80–5.10)
RDW: 12 % (ref 11.0–15.0)
Total Lymphocyte: 28.8 %
WBC: 6.3 10*3/uL (ref 3.8–10.8)

## 2022-08-13 LAB — COMPLETE METABOLIC PANEL WITH GFR
AG Ratio: 1.3 (calc) (ref 1.0–2.5)
ALT: 12 U/L (ref 6–29)
AST: 14 U/L (ref 10–35)
Albumin: 4.1 g/dL (ref 3.6–5.1)
Alkaline phosphatase (APISO): 105 U/L (ref 37–153)
BUN/Creatinine Ratio: 20 (calc) (ref 6–22)
BUN: 23 mg/dL (ref 7–25)
CO2: 27 mmol/L (ref 20–32)
Calcium: 8.9 mg/dL (ref 8.6–10.4)
Chloride: 102 mmol/L (ref 98–110)
Creat: 1.13 mg/dL — ABNORMAL HIGH (ref 0.60–1.00)
Globulin: 3.1 g/dL (calc) (ref 1.9–3.7)
Glucose, Bld: 87 mg/dL (ref 65–99)
Potassium: 4.1 mmol/L (ref 3.5–5.3)
Sodium: 140 mmol/L (ref 135–146)
Total Bilirubin: 0.4 mg/dL (ref 0.2–1.2)
Total Protein: 7.2 g/dL (ref 6.1–8.1)
eGFR: 50 mL/min/{1.73_m2} — ABNORMAL LOW (ref >=60)

## 2022-08-13 LAB — HEMOGLOBIN A1C
Hgb A1c MFr Bld: 5.6 % of total Hgb (ref <5.7)
Mean Plasma Glucose: 114 mg/dL
eAG (mmol/L): 6.3 mmol/L

## 2022-09-06 ENCOUNTER — Other Ambulatory Visit: Payer: Self-pay

## 2022-09-06 MED ORDER — TRIAMTERENE-HCTZ 37.5-25 MG PO TABS: 1.0000 | ORAL_TABLET | Freq: Every day | ORAL | 1 refills | Status: DC

## 2022-09-30 ENCOUNTER — Ambulatory Visit (INDEPENDENT_AMBULATORY_CARE_PROVIDER_SITE_OTHER): Payer: Medicare Other | Admitting: Family Medicine

## 2022-09-30 ENCOUNTER — Encounter: Payer: Self-pay | Admitting: Family Medicine

## 2022-09-30 VITALS — BP 125/58 | HR 65 | Ht 63.5 in | Wt 195.0 lb

## 2022-09-30 DIAGNOSIS — R6889 Other general symptoms and signs: Secondary | ICD-10-CM

## 2022-09-30 DIAGNOSIS — J0140 Acute pansinusitis, unspecified: Secondary | ICD-10-CM | POA: Diagnosis not present

## 2022-09-30 LAB — POCT INFLUENZA A/B
Influenza A, POC: NEGATIVE
Influenza B, POC: NEGATIVE

## 2022-09-30 LAB — POC COVID19 BINAXNOW: SARS Coronavirus 2 Ag: NEGATIVE

## 2022-09-30 MED ORDER — DOXYCYCLINE HYCLATE 100 MG PO TABS: 100.0000 mg | ORAL_TABLET | Freq: Two times a day (BID) | ORAL | 0 refills | Status: DC

## 2022-09-30 MED ORDER — FLUTICASONE-SALMETEROL 230-21 MCG/ACT IN AERO: 2.0000 | INHALATION_SPRAY | Freq: Two times a day (BID) | RESPIRATORY_TRACT | 1 refills | Status: DC

## 2022-09-30 MED ORDER — IPRATROPIUM-ALBUTEROL 20-100 MCG/ACT IN AERS: 1.0000 | INHALATION_SPRAY | Freq: Four times a day (QID) | RESPIRATORY_TRACT | 5 refills | Status: DC | PRN

## 2022-09-30 NOTE — Progress Notes (Signed)
Kimberly Jackson - 77 y.o. female MRN [id number]  Date of birth: 1946-02-21  Subjective Chief Complaint  Patient presents with   Nasal Congestion    HPI Kimberly Jackson is 77 y.o. female here today with complaint of congestion, body aches, sinus pain, fatigue and cough.  Has headache and pain in upper gums.  Mucus is thick and yellow. Denies wheezing or dyspnea.  Symptoms started about 5 days ago.    She denies fever but did have some chills.  No GI symptoms .  She has tried coricidin with some relief.  No covid or flu testing.     ROS:  A comprehensive ROS was completed and negative except as noted per HPI  Allergies  Allergen Reactions   Corticosteroids Other (See Comments)    Suicidal   Prochlorperazine Other (See Comments)    Acute dystonic reaction type   Cortisone    Egg-Derived Products Nausea And Vomiting   Nickel Rash    skin   Penicillins Rash    Past Medical History:  Diagnosis Date   Alcohol abuse    COPD (chronic obstructive pulmonary disease) (HCC)    Depression    Hypertension    OSA (obstructive sleep apnea)    Reflux     Past Surgical History:  Procedure Laterality Date   ABDOMINAL HYSTERECTOMY     APPENDECTOMY     77 yo   BLADDER SURGERY  2015   CHOLECYSTECTOMY     REPLACEMENT TOTAL KNEE     ROTATOR CUFF REPAIR     TONSILLECTOMY      Social History   Socioeconomic History   Marital status: Married    Spouse name: Not on file   Number of children: 3   Years of education: Not on file   Highest education level: Not on file  Occupational History   Not on file  Tobacco Use   Smoking status: Never   Smokeless tobacco: Never  Vaping Use   Vaping Use: Never used  Substance and Sexual Activity   Alcohol use: No    Alcohol/week: 0.0 standard drinks of alcohol   Drug use: No   Sexual activity: Not on file  Other Topics Concern   Not on file  Social History Narrative   Not on file   Social Determinants of Health   Financial Resource  Strain: Not on file  Food Insecurity: Not on file  Transportation Needs: Not on file  Physical Activity: Not on file  Stress: Not on file  Social Connections: Not on file    Family History  Problem Relation Age of Onset   Alcohol abuse Mother    Atrial fibrillation Father    Breast cancer Sister 38   Breast cancer Maternal Aunt    Breast cancer Paternal Aunt     Health Maintenance  Topic Date Due   Medicare Annual Wellness (AWV)  10/21/2022 (Originally 03/27/2019)   Zoster Vaccines- Shingrix (1 of 2) 12/31/2022 (Originally 10/23/1962)   COLONOSCOPY (Pts 45-10yr Insurance coverage will need to be confirmed)  09/30/2023 (Originally 09/16/2022)   COVID-19 Vaccine (3 - Pfizer risk series) 10/16/2023 (Originally 11/23/2019)   DTaP/Tdap/Td (2 - Td or Tdap) 11/19/2024   Pneumonia Vaccine 77 Years old  Completed   INFLUENZA VACCINE  Completed   DEXA SCAN  Completed   Hepatitis C Screening  Completed   HPV VACCINES  Aged Out     ----------------------------------------------------------------------------------------------------------------------------------------------------------------------------------------------------------------- Physical Exam BP (!) 125/58 (BP Location: Left Arm, Patient Position: Sitting,  Cuff Size: Large)   Pulse 65   Ht 5' 3.5" (1.613 m)   Wt 195 lb (88.5 kg)   SpO2 100%   BMI 34.00 kg/m   Physical Exam Constitutional:      Appearance: Normal appearance.  HENT:     Head: Normocephalic and atraumatic.     Nose:     Comments: Frontal and maxillary sinus tenderness.  Eyes:     General: No scleral icterus. Cardiovascular:     Rate and Rhythm: Normal rate and regular rhythm.  Pulmonary:     Effort: Pulmonary effort is normal.     Breath sounds: Normal breath sounds.  Musculoskeletal:     Cervical back: Neck supple.  Neurological:     Mental Status: She is alert.  Psychiatric:        Mood and Affect: Mood normal.        Behavior: Behavior normal.      ------------------------------------------------------------------------------------------------------------------------------------------------------------------------------------------------------------------- Assessment and Plan  Acute pansinusitis POC COVID and flu testing negative.  Will treat sinusitis with course of doxycycline as this often progresses to COPD exacerbation without antibiotics.  Instructed to contact clinic if symptoms worsen.    Meds ordered this encounter  Medications   doxycycline (VIBRA-TABS) 100 MG tablet    Sig: Take 1 tablet (100 mg total) by mouth 2 (two) times daily.    Dispense:  20 tablet    Refill:  0    No follow-ups on file.    This visit occurred during the SARS-CoV-2 public health emergency.  Safety protocols were in place, including screening questions prior to the visit, additional usage of staff PPE, and extensive cleaning of exam room while observing appropriate contact time as indicated for disinfecting solutions.

## 2022-09-30 NOTE — Assessment & Plan Note (Signed)
POC COVID and flu testing negative.  Will treat sinusitis with course of doxycycline as this often progresses to COPD exacerbation without antibiotics.  Instructed to contact clinic if symptoms worsen.

## 2022-09-30 NOTE — Addendum Note (Signed)
Addended by: Perlie Mayo on: 09/30/2022 10:37 AM   Modules accepted: Orders

## 2022-11-04 ENCOUNTER — Ambulatory Visit (INDEPENDENT_AMBULATORY_CARE_PROVIDER_SITE_OTHER): Payer: Medicare Other | Admitting: Family Medicine

## 2022-11-04 ENCOUNTER — Encounter: Payer: Self-pay | Admitting: Family Medicine

## 2022-11-04 VITALS — BP 139/43 | HR 61 | Temp 98.6°F | Ht 63.5 in | Wt 197.0 lb

## 2022-11-04 DIAGNOSIS — M1711 Unilateral primary osteoarthritis, right knee: Secondary | ICD-10-CM | POA: Diagnosis not present

## 2022-11-04 DIAGNOSIS — B379 Candidiasis, unspecified: Secondary | ICD-10-CM

## 2022-11-04 MED ORDER — FLUCONAZOLE 150 MG PO TABS
150.0000 mg | ORAL_TABLET | Freq: Once | ORAL | 0 refills | Status: AC
Start: 2022-11-04 — End: 2022-11-04

## 2022-11-04 NOTE — Progress Notes (Signed)
Established patient visit   Patient: Kimberly Jackson   DOB: 05-18-1946   77 y.o. Female  MRN: [id number] Visit Date: 11/04/2022  Today's healthcare provider: Charlton Amor, DO   Chief Complaint  Patient presents with   Rash    Located in her vagina, after taking -ABX. She states that she has tried OTC Vagisil, and has started a 2nd tube. She is starting to see a rash on her belly.     SUBJECTIVE    Chief Complaint  Patient presents with   Rash    Located in her vagina, after taking -ABX. She states that she has tried OTC Vagisil, and has started a 2nd tube. She is starting to see a rash on her belly.    HPI  Pt presents with concerns of a rash. She has been taking doxycycline for a sinus infection. She feels like she has a yeast infection. Rash started after completion of doxycycline. She notes itching. Did use OTC vagisil that helped some symptoms.   Review of Systems  Constitutional:  Negative for activity change, fatigue and fever.  Respiratory:  Negative for cough and shortness of breath.   Cardiovascular:  Negative for chest pain.  Gastrointestinal:  Negative for abdominal pain.  Genitourinary:  Negative for difficulty urinating.      Current Meds  Medication Sig   AMBULATORY NON FORMULARY MEDICATION Blood pressure monitor use daily for hypertension.   diclofenac Sodium (VOLTAREN) 1 % GEL APPLY 2 GRAMS TOPICALLY TO THE AFFECTED JOINT FOUR TIMES DAILY   doxycycline (VIBRA-TABS) 100 MG tablet Take 1 tablet (100 mg total) by mouth 2 (two) times daily.   fluconazole (DIFLUCAN) 150 MG tablet Take 1 tablet (150 mg total) by mouth once for 1 dose.   fluticasone-salmeterol (ADVAIR HFA) 230-21 MCG/ACT inhaler Inhale 2 puffs into the lungs 2 (two) times daily.   Ipratropium-Albuterol (COMBIVENT) 20-100 MCG/ACT AERS respimat Inhale 1 puff into the lungs every 6 (six) hours as needed for wheezing or shortness of breath.   losartan (COZAAR) 100 MG tablet TAKE 1 TABLET(100 MG) BY  MOUTH DAILY   magnesium chloride (SLOW-MAG) 64 MG TBEC SR tablet Take 1 tablet (64 mg total) by mouth daily.   metoprolol succinate (TOPROL-XL) 100 MG 24 hr tablet Take 1 tablet (100 mg total) by mouth daily. Take with or immediately following a meal.   omeprazole (PRILOSEC) 40 MG capsule TAKE 1 CAPSULE(40 MG) BY MOUTH DAILY   triamterene-hydrochlorothiazide (MAXZIDE-25) 37.5-25 MG tablet Take 1 tablet by mouth daily.   Vitamin D, Ergocalciferol, (DRISDOL) 1.25 MG (50000 UNIT) CAPS capsule Take 1 capsule (50,000 Units total) by mouth every 7 (seven) days.    OBJECTIVE    BP (!) 139/43   Pulse 61   Temp 98.6 F (37 C) (Oral)   Ht 5' 3.5" (1.613 m)   Wt 197 lb 0.6 oz (89.4 kg)   SpO2 99%   BMI 34.36 kg/m   Physical Exam Vitals and nursing note reviewed.  Constitutional:      General: She is not in acute distress.    Appearance: Normal appearance.  HENT:     Head: Normocephalic and atraumatic.     Right Ear: External ear normal.     Left Ear: External ear normal.     Nose: Nose normal.  Eyes:     Conjunctiva/sclera: Conjunctivae normal.  Cardiovascular:     Rate and Rhythm: Normal rate and regular rhythm.  Pulmonary:     Effort:  Pulmonary effort is normal.     Breath sounds: Normal breath sounds.  Neurological:     General: No focal deficit present.     Mental Status: She is alert and oriented to person, place, and time.  Psychiatric:        Mood and Affect: Mood normal.        Behavior: Behavior normal.        Thought Content: Thought content normal.        Judgment: Judgment normal.        ASSESSMENT & PLAN    Problem List Items Addressed This Visit       Musculoskeletal and Integument   Osteoarthritis    - pt wanted referral to Dr. Denyse [facility] for right knee osteoarthritis. Pt has hx of knee replacement on R knee      Relevant Orders   Ambulatory referral to Sports Medicine     Other   Yeast infection - Primary    - given diflucan  - if no  better RTC      Relevant Medications   fluconazole (DIFLUCAN) 150 MG tablet    No follow-ups on file.      Meds ordered this encounter  Medications   fluconazole (DIFLUCAN) 150 MG tablet    Sig: Take 1 tablet (150 mg total) by mouth once for 1 dose.    Dispense:  1 tablet    Refill:  0    Orders Placed This Encounter  Procedures   Ambulatory referral to Sports Medicine    Referral Priority:   Routine    Referral Type:   Consultation    Number of Visits Requested:   1     Charlton Amor, DO  [facility] at [facility] [phone] (phone) [phone] (fax)  [facility]

## 2022-11-04 NOTE — Assessment & Plan Note (Signed)
-   pt wanted referral to Dr. Denyse [facility] for right knee osteoarthritis. Pt has hx of knee replacement on R knee

## 2022-11-04 NOTE — Assessment & Plan Note (Signed)
-   given diflucan  - if no better RTC

## 2022-11-12 ENCOUNTER — Telehealth: Payer: Self-pay | Admitting: Family Medicine

## 2022-11-12 NOTE — Telephone Encounter (Signed)
Called patient to schedule Medicare Annual Wellness Visit (AWV). Left message for patient to call back and schedule Medicare Annual Wellness Visit (AWV).  Last date of AWV: 2019  Please schedule an appointment at any time with [facility].  If any questions, please contact me at [phone].  Thank you ,  Cira Servant Patient Access Advocate II Direct Dial: [phone]

## 2022-11-13 NOTE — Progress Notes (Unsigned)
   Rubin Payor, PhD, LAT, ATC acting as a scribe for Clementeen Graham, MD.  Subjective:    CC: Right knee pain  HPI: Patient is a 77 year old female presenting with right knee pain ongoing ***.  Patient relates pain to ***  R Knee swelling: Mechanical symptoms: Aggravates: Treatments tried:  Dx imaging: 09/01/17 R knee XR  Pertinent review of Systems: ***  Relevant historical information: ***   Objective:   There were no vitals filed for this visit. General: Well Developed, well nourished, and in no acute distress.   MSK: ***  Lab and Radiology Results No results found for this or any previous visit (from the past 72 hour(s)). No results found.    Impression and Recommendations:    Assessment and Plan: 77 y.o. female with ***.  PDMP not reviewed this encounter. No orders of the defined types were placed in this encounter.  No orders of the defined types were placed in this encounter.   Discussed warning signs or symptoms. Please see discharge instructions. Patient expresses understanding.   ***

## 2022-11-14 ENCOUNTER — Other Ambulatory Visit: Payer: Self-pay

## 2022-11-14 ENCOUNTER — Ambulatory Visit (INDEPENDENT_AMBULATORY_CARE_PROVIDER_SITE_OTHER): Payer: Medicare Other | Admitting: Family Medicine

## 2022-11-14 ENCOUNTER — Telehealth: Payer: Self-pay

## 2022-11-14 ENCOUNTER — Ambulatory Visit (INDEPENDENT_AMBULATORY_CARE_PROVIDER_SITE_OTHER): Payer: Medicare Other

## 2022-11-14 ENCOUNTER — Encounter: Payer: Self-pay | Admitting: Family Medicine

## 2022-11-14 VITALS — BP 122/78 | HR 62 | Ht 63.5 in | Wt 195.6 lb

## 2022-11-14 DIAGNOSIS — G8929 Other chronic pain: Secondary | ICD-10-CM

## 2022-11-14 DIAGNOSIS — M25561 Pain in right knee: Secondary | ICD-10-CM

## 2022-11-14 DIAGNOSIS — M1711 Unilateral primary osteoarthritis, right knee: Secondary | ICD-10-CM

## 2022-11-14 NOTE — Patient Instructions (Signed)
Thank you for coming in today.   Please get an Xray today before you leave   I've referred you to Physical Therapy.  Let us know if you don't hear from them in one week.   Recheck in 6 weeks.   Return sooner or let me know sooner if you have a problem.

## 2022-11-14 NOTE — Telephone Encounter (Signed)
Rodolph Bong, MD  Dierdre Searles, CMA Please authorize gel shots right knee

## 2022-11-14 NOTE — Telephone Encounter (Signed)
VOB initiated for Orthovisc for RIGHT knee OA.

## 2022-11-18 NOTE — Therapy (Unsigned)
OUTPATIENT PHYSICAL THERAPY LOWER EXTREMITY EVALUATION   Patient Name: Kimberly Jackson MRN: [id number] DOB:December 26, 1945, 77 y.o., female Today's Date: 11/20/2022  END OF SESSION:  PT End of Session - 11/20/22 0934     Visit Number 1    Number of Visits 16    Date for PT Re-Evaluation 01/15/23    PT Start Time 0935    PT Stop Time 1015    PT Time Calculation (min) 40 min    Activity Tolerance Patient tolerated treatment well             Past Medical History:  Diagnosis Date   Alcohol abuse    COPD (chronic obstructive pulmonary disease) (HCC)    Depression    Hypertension    OSA (obstructive sleep apnea)    Reflux    Past Surgical History:  Procedure Laterality Date   ABDOMINAL HYSTERECTOMY     APPENDECTOMY     77 yo   BLADDER SURGERY  2015   CHOLECYSTECTOMY     REPLACEMENT TOTAL KNEE     ROTATOR CUFF REPAIR     TONSILLECTOMY     Patient Active Problem List   Diagnosis Date Noted   Yeast infection 11/04/2022   Acute pansinusitis 09/30/2022   Heel pain 02/06/2022   Arthritis of hand 09/08/2021   Raynaud's phenomenon 09/08/2021   Combined forms of age-related cataract of right eye 07/10/2021   Upper respiratory infection 04/03/2021   Suspected COVID-19 virus infection 08/16/2020   COPD exacerbation (HCC) 06/30/2020   Osteoarthritis 04/16/2020   Leg cramps 09/26/2019   Carpal tunnel syndrome 12/03/2018   Cataract 04/29/2018   Magnesium deficiency 10/06/2017   Low calcium levels 10/06/2017   Poor venous access 09/30/2017   Right knee DJD 09/10/2017   Left shoulder pain 12/09/2016   OSA and COPD overlap syndrome (HCC) 07/05/2016   Right foot pain 12/07/2015   Vitamin D deficiency 09/11/2015   Prediabetes 09/08/2015   Osteopenia 09/08/2015   Ventral hernia 09/08/2015   COPD (chronic obstructive pulmonary disease) (HCC) 09/04/2015   Paroxysmal A-fib (HCC) 09/04/2015   HTN (hypertension) 09/04/2015   Long-term use of high-risk medication 09/04/2015     PCP: Dr Everrett Coombe  REFERRING PROVIDER: Dr Everrett Coombe  REFERRING DIAG: Chronic Rt knee pain; OA Rt knee   THERAPY DIAG:  Chronic pain of right knee  Primary osteoarthritis of right knee  Muscle weakness (generalized)  Rationale for Evaluation and Treatment: Rehabilitation  ONSET DATE: 09/20/22  SUBJECTIVE:   SUBJECTIVE STATEMENT: Patient reports that she is having Rt knee pain for the past several years but symptoms resolved. Has had increased pain in the past 2 months with no known injury. She is having cramps in both legs. Wakes with pain about 3 or 4 in the morning   PERTINENT HISTORY: Lt TKA ~2014; LBP; HTN; irregular heart beat; COPD; reflux; pain in  both feet   PAIN:  Are you having pain? Yes: NPRS scale: 2/10; up to 8/10 Pain location: Rt knee  Pain description: achy and dull; can have sharp pain Aggravating factors: moving sit to stand; walking; steps   Relieving factors: topical analgesic   PRECAUTIONS: None  WEIGHT BEARING RESTRICTIONS: No  FALLS:  Has patient fallen in last 6 months? No  LIVING ENVIRONMENT: Lives with: lives with their family and lives with their spouse Lives in: House/apartment Stairs: Yes: Internal: 0 steps; none and External: 2 steps; can reach both Has following equipment at home: Single point cane and Walker -  2 wheeled  OCCUPATION: retired; busy with art work; volunteering; household chores; cooking   PLOF: Independent  PATIENT GOALS: decrease pain in the Rt knee and improve mobility   NEXT MD VISIT: 02/14/23  OBJECTIVE:   DIAGNOSTIC FINDINGS: ray Rt knee 11/15/22: Mild degenerative change in the patellofemoral compartment.   PATIENT SURVEYS:    COGNITION: Overall cognitive status: Within functional limits for tasks assessed     SENSATION: WFL  EDEMA:  Mild   MUSCLE LENGTH: Hamstrings: Right 60 deg; Left 55 deg Thomas test: tight bilat   POSTURE: rounded shoulders, forward head, increased thoracic  kyphosis, flexed trunk , and weight shift left  PALPATION: Tenderness to palpation through Rt lateral knee > medial knee   LOWER EXTREMITY ROM:  Active ROM Right eval Left eval  Hip flexion    Hip extension    Hip abduction    Hip adduction    Hip internal rotation    Hip external rotation    Knee flexion 119 112  Knee extension 0 0  Ankle dorsiflexion    Ankle plantarflexion    Ankle inversion    Ankle eversion     (Blank rows = not tested)  LOWER EXTREMITY MMT:  MMT Right eval Left eval  Hip flexion 4 4  Hip extension 4- 4-  Hip abduction 4- 4  Hip adduction    Hip internal rotation    Hip external rotation    Knee flexion 4+ 5-  Knee extension 4+ 5-  Ankle dorsiflexion    Ankle plantarflexion    Ankle inversion    Ankle eversion     (Blank rows = not tested)   FUNCTIONAL TESTS:  5 times sit to stand: 20.09 sec;  some discomfort Rt knee   GAIT: Distance walked: 40 Assistive device utilized: None Level of assistance: Complete Independence Comments: decreased wt bearing on Rt LT with WB on Rt    [facility] Adult PT Treatment:                                                DATE: 11/20/22 Therapeutic Exercise: Supine  Hamstring stretch 30 sec x 2 with strap  Quad set 5 sec x 5 SLR 5 sec x 5  Sitting  Hamstring stretch 30 x 2 Standing  Gastroc stretch 30 sec x 2 Soleus stretch 30 sec x 2  Manual Therapy:  Neuromuscular re-ed:  Therapeutic Activity:  Gait:  Modalities: Encouraged patient to ice at home  Self Care:     PATIENT EDUCATION:  Education details: POC; HEP Person educated: Patient Education method: Programmer, multimedia, Facilities manager, Actor cues, Verbal cues, and Handouts Education comprehension: verbalized understanding, returned demonstration, verbal cues required, tactile cues required, and needs further education  HOME EXERCISE PROGRAM: Access Code: [email or web address] URL: [email or web address] Date: 11/20/2022 Prepared by: Corlis Leak  Exercises - Hooklying Hamstring Stretch with Strap  - 1-2 x daily - 7 x weekly - 1 sets - 3 reps - 30 sec  hold - Supine Quad Set  - 1-2 x daily - 7 x weekly - 1 sets - 10 reps - 3 sec  hold - Small Range Straight Leg Raise  - 1-2 x daily - 7 x weekly - 1 sets - 10 reps - 5 sec  hold - Supine Gluteal Sets  - 1-2 x daily - 7 x weekly - 1  sets - 10 reps - 10 sec  hold - Seated Hamstring Stretch  - 2 x daily - 7 x weekly - 1 sets - 3 reps - 30 sec  hold - Standing Gastroc Stretch  - 2 x daily - 7 x weekly - 1 sets - 3 reps - 30 sec  hold - Standing Gastroc Stretch  - 2 x daily - 7 x weekly - 1 sets - 3 reps - 30 sec  hold  ASSESSMENT:  CLINICAL IMPRESSION: Patient is a 77 y.o. female who was seen today for physical therapy evaluation and treatment for chronic Rt knee pain; worsening in the past 2 months. She has poor posture and alignment; abnormal gait pattern; decreased ROM, strength, function Rt LE; pain limiting functional activities. Patient will benefit from PT to address problems identified.   OBJECTIVE IMPAIRMENTS: Abnormal gait, decreased activity tolerance, decreased balance, decreased mobility, decreased ROM, decreased strength, hypomobility, increased fascial restrictions, increased muscle spasms, impaired flexibility, improper body mechanics, postural dysfunction, and pain.   ACTIVITY LIMITATIONS: carrying, lifting, bending, standing, squatting, stairs, transfers, and locomotion level  PARTICIPATION LIMITATIONS: meal prep, cleaning, laundry, shopping, and community activity  PERSONAL FACTORS: Age, Fitness, Past/current experiences, and Time since onset of injury/illness/exacerbation are also affecting patient's functional outcome.   REHAB POTENTIAL: Good  CLINICAL DECISION MAKING: Stable/uncomplicated  EVALUATION COMPLEXITY: Low   GOALS: Goals reviewed with patient? Yes  SHORT TERM GOALS: Target date: 12/18/2022  Independent in initial HEP  Baseline: Goal status:  INITIAL  2.  Patient reports consistently exercising on a daily basis  Baseline:  Goal status: INITIAL    LONG TERM GOALS: Target date: 01/15/2023   Increased hamstring length - decreased tightness with SLR to 70 degrees bilat Baseline:  Goal status: INITIAL  2.  Increase strength bilat LE's to 4+/5 to 5/5 throughout  Baseline:  Goal status: INITIAL  3.  Decrease times sit to stand by 3-5 sec  Baseline:  Goal status: INITIAL  4.  Decrease pain in Rt knee by 30-50% allowing patient to improve functional activity level  Baseline:  Goal status: INITIAL  5.   Patient reports resolution of LE cramps  Baseline:  Goal status: INITIAL  6.  Independent in HEP including aquatic program as indicated  Baseline:  Goal status: INITIAL   PLAN:  PT FREQUENCY: 2x/week  PT DURATION: 8 weeks  PLANNED INTERVENTIONS: Therapeutic exercises, Therapeutic activity, Neuromuscular re-education, Balance training, Gait training, Patient/Family education, Self Care, Joint mobilization, Aquatic Therapy, Dry Needling, Electrical stimulation, Cryotherapy, Moist heat, Taping, Vasopneumatic device, Ultrasound, Ionotophoresis 4mg /ml Dexamethasone, Manual therapy, and Re-evaluation  PLAN FOR NEXT SESSION: review and progress exercises; manual work, DN, modalities as indicated    [employer], PT 11/20/2022, 3:06 PM

## 2022-11-18 NOTE — Progress Notes (Signed)
Right knee x-ray shows mild arthritis changes

## 2022-11-19 ENCOUNTER — Telehealth: Payer: Self-pay | Admitting: Family Medicine

## 2022-11-19 NOTE — Telephone Encounter (Signed)
Pt called and states that she needs another pill for her yeast infection. She was only given one and thought it was getting better but she still has it. She can not remember the name of the pill she was given for the yeast infection.  Please send to [facility] in [place].

## 2022-11-20 ENCOUNTER — Encounter: Payer: Self-pay | Admitting: Rehabilitative and Restorative Service Providers"

## 2022-11-20 ENCOUNTER — Other Ambulatory Visit: Payer: Self-pay

## 2022-11-20 ENCOUNTER — Ambulatory Visit: Payer: Medicare Other | Attending: Family Medicine | Admitting: Rehabilitative and Restorative Service Providers"

## 2022-11-20 ENCOUNTER — Other Ambulatory Visit: Payer: Self-pay | Admitting: Family Medicine

## 2022-11-20 DIAGNOSIS — M1711 Unilateral primary osteoarthritis, right knee: Secondary | ICD-10-CM | POA: Insufficient documentation

## 2022-11-20 DIAGNOSIS — M25561 Pain in right knee: Secondary | ICD-10-CM | POA: Diagnosis not present

## 2022-11-20 DIAGNOSIS — M6281 Muscle weakness (generalized): Secondary | ICD-10-CM | POA: Diagnosis not present

## 2022-11-20 DIAGNOSIS — G8929 Other chronic pain: Secondary | ICD-10-CM | POA: Insufficient documentation

## 2022-11-20 NOTE — Telephone Encounter (Signed)
[facility] for RIGHT knee OA  Primary insurance: Medicare Co-Pay: n/a Co-Insurance: 20% Deductible: $240 of $240 met Prior Auth: NOT required  Secondary Insurance: [employer]  Co-Pay: n/a Co-Insurance: covers Engineer, water Deductible: covers Medicare deductible Prior Auth: NOT required

## 2022-11-20 NOTE — Telephone Encounter (Signed)
Per Dr Zollie Pee note: Will proceed to physical therapy now and recheck in 6 weeks.  Also work on authorization of hyaluronic acid injections.  That would be a good next step if pain worsens or does not improve with PT.  Holding to scheduled when patient is ready to proceed after PT or if needed.

## 2022-11-20 NOTE — Telephone Encounter (Signed)
Noted - no prior auth required, OK to schedule unless health insurance coverage changes.

## 2022-11-21 MED ORDER — FLUCONAZOLE 150 MG PO TABS: 150.0000 mg | ORAL_TABLET | Freq: Once | ORAL | 0 refills | Status: AC

## 2022-11-21 NOTE — Telephone Encounter (Signed)
Patient called office requesting Rx be called into   [facility] [address] [place], [place]  Ph: [phone]  Pt states she can be reached at [phone]

## 2022-11-21 NOTE — Telephone Encounter (Signed)
Pt aware that script has been called into [facility] in [place]. Pt is very appreciative.

## 2022-12-05 ENCOUNTER — Ambulatory Visit: Payer: Medicare Other

## 2022-12-05 DIAGNOSIS — M6281 Muscle weakness (generalized): Secondary | ICD-10-CM

## 2022-12-05 DIAGNOSIS — M25561 Pain in right knee: Secondary | ICD-10-CM | POA: Diagnosis not present

## 2022-12-05 DIAGNOSIS — M1711 Unilateral primary osteoarthritis, right knee: Secondary | ICD-10-CM | POA: Diagnosis not present

## 2022-12-05 DIAGNOSIS — G8929 Other chronic pain: Secondary | ICD-10-CM

## 2022-12-05 NOTE — Therapy (Signed)
OUTPATIENT PHYSICAL THERAPY LOWER EXTREMITY TREATMENT   Patient Name: Kimberly Jackson MRN: [id number] DOB:06-Dec-1945, 77 y.o., female Today's Date: 12/05/2022  END OF SESSION:  PT End of Session - 12/05/22 1145     Visit Number 2    Number of Visits 16    Date for PT Re-Evaluation 01/15/23    PT Start Time 1145    PT Stop Time 1223    PT Time Calculation (min) 38 min    Activity Tolerance Patient tolerated treatment well    Behavior During Therapy WFL for tasks assessed/performed             Past Medical History:  Diagnosis Date   Alcohol abuse    COPD (chronic obstructive pulmonary disease) (HCC)    Depression    Hypertension    OSA (obstructive sleep apnea)    Reflux    Past Surgical History:  Procedure Laterality Date   ABDOMINAL HYSTERECTOMY     APPENDECTOMY     77 yo   BLADDER SURGERY  2015   CHOLECYSTECTOMY     REPLACEMENT TOTAL KNEE     ROTATOR CUFF REPAIR     TONSILLECTOMY     Patient Active Problem List   Diagnosis Date Noted   Yeast infection 11/04/2022   Acute pansinusitis 09/30/2022   Heel pain 02/06/2022   Arthritis of hand 09/08/2021   Raynaud's phenomenon 09/08/2021   Combined forms of age-related cataract of right eye 07/10/2021   Upper respiratory infection 04/03/2021   Suspected COVID-19 virus infection 08/16/2020   COPD exacerbation (HCC) 06/30/2020   Osteoarthritis 04/16/2020   Leg cramps 09/26/2019   Carpal tunnel syndrome 12/03/2018   Cataract 04/29/2018   Magnesium deficiency 10/06/2017   Low calcium levels 10/06/2017   Poor venous access 09/30/2017   Right knee DJD 09/10/2017   Left shoulder pain 12/09/2016   OSA and COPD overlap syndrome (HCC) 07/05/2016   Right foot pain 12/07/2015   Vitamin D deficiency 09/11/2015   Prediabetes 09/08/2015   Osteopenia 09/08/2015   Ventral hernia 09/08/2015   COPD (chronic obstructive pulmonary disease) (HCC) 09/04/2015   Paroxysmal A-fib (HCC) 09/04/2015   HTN (hypertension) 09/04/2015    Long-term use of high-risk medication 09/04/2015    PCP: Dr Everrett Coombe  REFERRING PROVIDER: Dr Everrett Coombe  REFERRING DIAG: Chronic Rt knee pain; OA Rt knee   THERAPY DIAG:  Chronic pain of right knee  Primary osteoarthritis of right knee  Muscle weakness (generalized)  Rationale for Evaluation and Treatment: Rehabilitation  ONSET DATE: 09/20/22  SUBJECTIVE:   SUBJECTIVE STATEMENT: Patient reports she has been having cramps in R calf, reports 2/10 pain in R knee today.   PERTINENT HISTORY: Lt TKA ~2014; LBP; HTN; irregular heart beat; COPD; reflux; pain in  both feet   PAIN:  Are you having pain? Yes: NPRS scale: 2/10; up to 8/10 Pain location: Rt knee  Pain description: achy and dull; can have sharp pain Aggravating factors: moving sit to stand; walking; steps   Relieving factors: topical analgesic   PRECAUTIONS: None  WEIGHT BEARING RESTRICTIONS: No  FALLS:  Has patient fallen in last 6 months? No  LIVING ENVIRONMENT: Lives with: lives with their family and lives with their spouse Lives in: House/apartment Stairs: Yes: Internal: 0 steps; none and External: 2 steps; can reach both Has following equipment at home: Single point cane and Walker - 2 wheeled  OCCUPATION: retired; busy with art work; volunteering; household chores; cooking   PLOF: Independent  PATIENT  GOALS: decrease pain in the Rt knee and improve mobility   NEXT MD VISIT: 02/14/23  OBJECTIVE:   DIAGNOSTIC FINDINGS: ray Rt knee 11/15/22: Mild degenerative change in the patellofemoral compartment.   PATIENT SURVEYS:    COGNITION: Overall cognitive status: Within functional limits for tasks assessed     SENSATION: WFL  EDEMA:  Mild   MUSCLE LENGTH: Hamstrings: Right 60 deg; Left 55 deg Thomas test: tight bilat   POSTURE: rounded shoulders, forward head, increased thoracic kyphosis, flexed trunk , and weight shift left  PALPATION: Tenderness to palpation through Rt lateral  knee > medial knee   LOWER EXTREMITY ROM:  Active ROM Right eval Left eval  Hip flexion    Hip extension    Hip abduction    Hip adduction    Hip internal rotation    Hip external rotation    Knee flexion 119 112  Knee extension 0 0  Ankle dorsiflexion    Ankle plantarflexion    Ankle inversion    Ankle eversion     (Blank rows = not tested)  LOWER EXTREMITY MMT:  MMT Right eval Left eval  Hip flexion 4 4  Hip extension 4- 4-  Hip abduction 4- 4  Hip adduction    Hip internal rotation    Hip external rotation    Knee flexion 4+ 5-  Knee extension 4+ 5-  Ankle dorsiflexion    Ankle plantarflexion    Ankle inversion    Ankle eversion     (Blank rows = not tested)   FUNCTIONAL TESTS:  5 times sit to stand: 20.09 sec;  some discomfort Rt knee   GAIT: Distance walked: 40 Assistive device utilized: None Level of assistance: Complete Independence Comments: decreased wt bearing on Rt LT with WB on Rt    [facility] Adult PT Treatment:                                                DATE: 12/05/2022 Therapeutic Exercise: Gastroc/soleus stretches at wall  Recumbent bike L1 x Supine: HS/ITB stretches w/strap 2x30" Quad set 5x5" SAQ green bolster 5x5" SLR 8x5" S/L clamshells RTB 2x10  S/L hip abd in ext 2x10 Seated HS stretch (foot propped on stool) 3x30" Standing R TKE GTB 10x5"   [facility] Adult PT Treatment:                                                DATE: 11/20/22 Therapeutic Exercise: Supine  Hamstring stretch 30 sec x 2 with strap  Quad set 5 sec x 5 SLR 5 sec x 5  Sitting  Hamstring stretch 30 x 2 Standing  Gastroc stretch 30 sec x 2 Soleus stretch 30 sec x 2  Modalities: Encouraged patient to ice at home     PATIENT EDUCATION:  Education details: POC; HEP Person educated: Patient Education method: Programmer, multimedia, Facilities manager, Actor cues, Verbal cues, and Handouts Education comprehension: verbalized understanding, returned demonstration,  verbal cues required, tactile cues required, and needs further education  HOME EXERCISE PROGRAM: Access Code: [email or web address] URL: [email or web address] Date: 11/20/2022 Prepared by: Corlis Leak  Exercises - Hooklying Hamstring Stretch with Strap  - 1-2 x daily - 7 x weekly - 1 sets -  3 reps - 30 sec  hold - Supine Quad Set  - 1-2 x daily - 7 x weekly - 1 sets - 10 reps - 3 sec  hold - Small Range Straight Leg Raise  - 1-2 x daily - 7 x weekly - 1 sets - 10 reps - 5 sec  hold - Supine Gluteal Sets  - 1-2 x daily - 7 x weekly - 1 sets - 10 reps - 10 sec  hold - Seated Hamstring Stretch  - 2 x daily - 7 x weekly - 1 sets - 3 reps - 30 sec  hold - Standing Gastroc Stretch  - 2 x daily - 7 x weekly - 1 sets - 3 reps - 30 sec  hold - Standing Gastroc Stretch  - 2 x daily - 7 x weekly - 1 sets - 3 reps - 30 sec  hold  ASSESSMENT:  CLINICAL IMPRESSION: Hip strengthening progressed with hip abduction variations; occasional tactile cues provided to improve pelvic alignment and stability.   OBJECTIVE IMPAIRMENTS: Abnormal gait, decreased activity tolerance, decreased balance, decreased mobility, decreased ROM, decreased strength, hypomobility, increased fascial restrictions, increased muscle spasms, impaired flexibility, improper body mechanics, postural dysfunction, and pain.   ACTIVITY LIMITATIONS: carrying, lifting, bending, standing, squatting, stairs, transfers, and locomotion level  PARTICIPATION LIMITATIONS: meal prep, cleaning, laundry, shopping, and community activity  PERSONAL FACTORS: Age, Fitness, Past/current experiences, and Time since onset of injury/illness/exacerbation are also affecting patient's functional outcome.   REHAB POTENTIAL: Good  CLINICAL DECISION MAKING: Stable/uncomplicated  EVALUATION COMPLEXITY: Low   GOALS: Goals reviewed with patient? Yes  SHORT TERM GOALS: Target date: 12/18/2022  Independent in initial HEP  Baseline: Goal status:  INITIAL  2.  Patient reports consistently exercising on a daily basis  Baseline:  Goal status: INITIAL    LONG TERM GOALS: Target date: 01/15/2023  Increased hamstring length - decreased tightness with SLR to 70 degrees bilat Baseline:  Goal status: INITIAL  2.  Increase strength bilat LE's to 4+/5 to 5/5 throughout  Baseline:  Goal status: INITIAL  3.  Decrease times sit to stand by 3-5 sec  Baseline:  Goal status: INITIAL  4.  Decrease pain in Rt knee by 30-50% allowing patient to improve functional activity level  Baseline:  Goal status: INITIAL  5.   Patient reports resolution of LE cramps  Baseline:  Goal status: INITIAL  6.  Independent in HEP including aquatic program as indicated  Baseline:  Goal status: INITIAL   PLAN:  PT FREQUENCY: 2x/week  PT DURATION: 8 weeks  PLANNED INTERVENTIONS: Therapeutic exercises, Therapeutic activity, Neuromuscular re-education, Balance training, Gait training, Patient/Family education, Self Care, Joint mobilization, Aquatic Therapy, Dry Needling, Electrical stimulation, Cryotherapy, Moist heat, Taping, Vasopneumatic device, Ultrasound, Ionotophoresis 4mg /ml Dexamethasone, Manual therapy, and Re-evaluation  PLAN FOR NEXT SESSION: review and progress exercises; manual work, DN, modalities as indicated    Sanjuana Mae, PTA 12/05/2022, 12:25 PM

## 2022-12-09 ENCOUNTER — Ambulatory Visit: Payer: Medicare Other

## 2022-12-09 DIAGNOSIS — G8929 Other chronic pain: Secondary | ICD-10-CM

## 2022-12-09 DIAGNOSIS — M6281 Muscle weakness (generalized): Secondary | ICD-10-CM | POA: Diagnosis not present

## 2022-12-09 DIAGNOSIS — M1711 Unilateral primary osteoarthritis, right knee: Secondary | ICD-10-CM

## 2022-12-09 DIAGNOSIS — M25561 Pain in right knee: Secondary | ICD-10-CM | POA: Diagnosis not present

## 2022-12-09 NOTE — Therapy (Addendum)
 OUTPATIENT PHYSICAL THERAPY LOWER EXTREMITY TREATMENT PHYSICAL THERAPY DISCHARGE SUMMARY  Visits from Start of Care: 3  Current functional level related to goals / functional outcomes: See progress note for discharge status    Remaining deficits: Unknown    Education / Equipment: HEP    Patient agrees to discharge. Patient goals were not met. Patient is being discharged due to not returning since the last visit.  Celyn P. Leonor Liv PT, MPH 09/29/23 7:55 AM    Patient Name: Kimberly Jackson MRN: [id number] DOB:[birth year], 77 y.o., female Today's Date: 12/09/2022  END OF SESSION:  PT End of Session - 12/09/22 1144     Visit Number 3    Number of Visits 16    Date for PT Re-Evaluation 01/15/23    PT Start Time 1145    PT Stop Time 1223    PT Time Calculation (min) 38 min    Activity Tolerance Patient tolerated treatment well    Behavior During Therapy WFL for tasks assessed/performed             Past Medical History:  Diagnosis Date   Alcohol abuse    COPD (chronic obstructive pulmonary disease) (HCC)    Depression    Hypertension    OSA (obstructive sleep apnea)    Reflux    Past Surgical History:  Procedure Laterality Date   ABDOMINAL HYSTERECTOMY     APPENDECTOMY     77 yo   BLADDER SURGERY  2015   CHOLECYSTECTOMY     REPLACEMENT TOTAL KNEE     ROTATOR CUFF REPAIR     TONSILLECTOMY     Patient Active Problem List   Diagnosis Date Noted   Yeast infection 11/04/2022   Acute pansinusitis 09/30/2022   Heel pain 02/06/2022   Arthritis of hand 09/08/2021   Raynaud's phenomenon 09/08/2021   Combined forms of age-related cataract of right eye 07/10/2021   Upper respiratory infection 04/03/2021   Suspected COVID-19 virus infection 08/16/2020   COPD exacerbation (HCC) 06/30/2020   Osteoarthritis 04/16/2020   Leg cramps 09/26/2019   Carpal tunnel syndrome 12/03/2018   Cataract 04/29/2018   Magnesium deficiency 10/06/2017   Low calcium levels 10/06/2017    Poor venous access 09/30/2017   Right knee DJD 09/10/2017   Left shoulder pain 12/09/2016   OSA and COPD overlap syndrome (HCC) 07/05/2016   Right foot pain 12/07/2015   Vitamin D deficiency 09/11/2015   Prediabetes 09/08/2015   Osteopenia 09/08/2015   Ventral hernia 09/08/2015   COPD (chronic obstructive pulmonary disease) (HCC) 09/04/2015   Paroxysmal A-fib (HCC) 09/04/2015   HTN (hypertension) 09/04/2015   Long-term use of high-risk medication 09/04/2015    PCP: Dr Everrett Coombe  REFERRING PROVIDER: Dr Everrett Coombe  REFERRING DIAG: Chronic Rt knee pain; OA Rt knee   THERAPY DIAG:  Chronic pain of right knee  Primary osteoarthritis of right knee  Muscle weakness (generalized)  Rationale for Evaluation and Treatment: Rehabilitation  ONSET DATE: 09/20/22  SUBJECTIVE:   SUBJECTIVE STATEMENT: Patient reports she is feeling better today, states she did some exercises prior to therapy today. Patient states she will be driving by herself for 8 hours.   PERTINENT HISTORY: Lt TKA ~2014; LBP; HTN; irregular heart beat; COPD; reflux; pain in  both feet   PAIN:  Are you having pain? Yes: NPRS scale: 2/10; up to 8/10 Pain location: Rt knee  Pain description: achy and dull; can have sharp pain Aggravating factors: moving sit to stand; walking; steps  Relieving factors: topical analgesic   PRECAUTIONS: None  WEIGHT BEARING RESTRICTIONS: No  FALLS:  Has patient fallen in last 6 months? No  LIVING ENVIRONMENT: Lives with: lives with their family and lives with their spouse Lives in: House/apartment Stairs: Yes: Internal: 0 steps; none and External: 2 steps; can reach both Has following equipment at home: Single point cane and Walker - 2 wheeled  OCCUPATION: retired; busy with art work; volunteering; household chores; cooking   PLOF: Independent  PATIENT GOALS: decrease pain in the Rt knee and improve mobility   NEXT MD VISIT: 02/14/23  OBJECTIVE:   DIAGNOSTIC  FINDINGS: ray Rt knee 11/15/22: Mild degenerative change in the patellofemoral compartment.   PATIENT SURVEYS:    COGNITION: Overall cognitive status: Within functional limits for tasks assessed     SENSATION: WFL  EDEMA:  Mild   MUSCLE LENGTH: Hamstrings: Right 60 deg; Left 55 deg Thomas test: tight bilat   POSTURE: rounded shoulders, forward head, increased thoracic kyphosis, flexed trunk , and weight shift left  PALPATION: Tenderness to palpation through Rt lateral knee > medial knee   LOWER EXTREMITY ROM:  Active ROM Right eval Left eval  Hip flexion    Hip extension    Hip abduction    Hip adduction    Hip internal rotation    Hip external rotation    Knee flexion 119 112  Knee extension 0 0  Ankle dorsiflexion    Ankle plantarflexion    Ankle inversion    Ankle eversion     (Blank rows = not tested)  LOWER EXTREMITY MMT:  MMT Right eval Left eval  Hip flexion 4 4  Hip extension 4- 4-  Hip abduction 4- 4  Hip adduction    Hip internal rotation    Hip external rotation    Knee flexion 4+ 5-  Knee extension 4+ 5-  Ankle dorsiflexion    Ankle plantarflexion    Ankle inversion    Ankle eversion     (Blank rows = not tested)   FUNCTIONAL TESTS:  5 times sit to stand: 20.09 sec;  some discomfort Rt knee   GAIT: Distance walked: 40 Assistive device utilized: None Level of assistance: Complete Independence Comments: decreased wt bearing on Rt LT with WB on Rt    [facility] Adult PT Treatment:                                                DATE: 12/09/2022 Therapeutic Exercise: NuStep L5 x 5 min Seated  HS stretches w/strap 2x30" B LAQ 2#AW 10x5" B Clamshells GTB x15  Knee extension RTB 10x5" B S/L clamshells RTB 2x10    [facility] Adult PT Treatment:                                                DATE: 12/05/2022 Therapeutic Exercise: Gastroc/soleus stretches at wall  Recumbent bike L1 x Supine: HS/ITB stretches w/strap 2x30" Quad set  5x5" SAQ green bolster 5x5" SLR 8x5" S/L clamshells RTB 2x10  S/L hip abd in ext 2x10 Seated HS stretch (foot propped on stool) 3x30" Standing R TKE GTB 10x5"   PATIENT EDUCATION:  Education details: Updated HEP Person educated: Patient Education method:  Explanation, Demonstration, Tactile cues, Verbal cues, and Handouts Education comprehension: verbalized understanding, returned demonstration, verbal cues required, tactile cues required, and needs further education  HOME EXERCISE PROGRAM: Access Code: [email or web address] URL: [email or web address] Date: 12/09/2022 Prepared by: Carlynn Herald  Exercises - Hooklying Hamstring Stretch with Strap  - 1-2 x daily - 7 x weekly - 1 sets - 3 reps - 30 sec  hold - Supine Quad Set  - 1-2 x daily - 7 x weekly - 1 sets - 10 reps - 3 sec  hold - Small Range Straight Leg Raise  - 1-2 x daily - 7 x weekly - 1 sets - 10 reps - 5 sec  hold - Supine Gluteal Sets  - 1-2 x daily - 7 x weekly - 1 sets - 10 reps - 10 sec  hold - Seated Hamstring Stretch  - 2 x daily - 7 x weekly - 1 sets - 3 reps - 30 sec  hold - Standing Gastroc Stretch  - 2 x daily - 7 x weekly - 1 sets - 3 reps - 30 sec  hold - Standing Terminal Knee Extension with Resistance  - 1 x daily - 7 x weekly - 3 sets - 10 reps - 5 sec hold - Seated Knee Extension with Resistance  - 1 x daily - 7 x weekly - 3 sets - 10 reps - Seated Hamstring Curl with Anchored Resistance  - 1 x daily - 7 x weekly - 3 sets - 10 reps - Clam with Resistance  - 1 x daily - 7 x weekly - 3 sets - 10 reps  ASSESSMENT:  CLINICAL IMPRESSION: Quad and glute strengthening progressed with added resistance in sitting and supine. Tactile cues provided to improve pelvic alignment and stability. HEP updated to progress patient's exercises with extended time away due to family events.   OBJECTIVE IMPAIRMENTS: Abnormal gait, decreased activity tolerance, decreased balance, decreased mobility, decreased ROM, decreased  strength, hypomobility, increased fascial restrictions, increased muscle spasms, impaired flexibility, improper body mechanics, postural dysfunction, and pain.   ACTIVITY LIMITATIONS: carrying, lifting, bending, standing, squatting, stairs, transfers, and locomotion level  PARTICIPATION LIMITATIONS: meal prep, cleaning, laundry, shopping, and community activity  PERSONAL FACTORS: Age, Fitness, Past/current experiences, and Time since onset of injury/illness/exacerbation are also affecting patient's functional outcome.   REHAB POTENTIAL: Good  CLINICAL DECISION MAKING: Stable/uncomplicated  EVALUATION COMPLEXITY: Low   GOALS: Goals reviewed with patient? Yes  SHORT TERM GOALS: Target date: 12/18/2022  Independent in initial HEP  Baseline: Goal status: INITIAL  2.  Patient reports consistently exercising on a daily basis  Baseline:  Goal status: INITIAL    LONG TERM GOALS: Target date: 01/15/2023  Increased hamstring length - decreased tightness with SLR to 70 degrees bilat Baseline:  Goal status: INITIAL  2.  Increase strength bilat LE's to 4+/5 to 5/5 throughout  Baseline:  Goal status: INITIAL  3.  Decrease times sit to stand by 3-5 sec  Baseline:  Goal status: INITIAL  4.  Decrease pain in Rt knee by 30-50% allowing patient to improve functional activity level  Baseline:  Goal status: INITIAL  5.   Patient reports resolution of LE cramps  Baseline:  Goal status: INITIAL  6.  Independent in HEP including aquatic program as indicated  Baseline:  Goal status: INITIAL   PLAN:  PT FREQUENCY: 2x/week  PT DURATION: 8 weeks  PLANNED INTERVENTIONS: Therapeutic exercises, Therapeutic activity, Neuromuscular re-education, Balance training, Gait training, Patient/Family education, Self Care, Joint mobilization,  Aquatic Therapy, Dry Needling, Electrical stimulation, Cryotherapy, Moist heat, Taping, Vasopneumatic device, Ultrasound, Ionotophoresis 4mg /ml  Dexamethasone, Manual therapy, and Re-evaluation  PLAN FOR NEXT SESSION: Follow up on pain/HEP progress during time away. Review and progress exercises; manual work, DN, modalities as indicated    Sanjuana Mae, PTA 12/09/2022, 12:26 PM

## 2022-12-12 ENCOUNTER — Ambulatory Visit: Payer: Medicare Other | Admitting: Rehabilitative and Restorative Service Providers"

## 2022-12-20 ENCOUNTER — Ambulatory Visit (HOSPITAL_BASED_OUTPATIENT_CLINIC_OR_DEPARTMENT_OTHER): Payer: Medicare Other | Admitting: Physical Therapy

## 2022-12-27 ENCOUNTER — Ambulatory Visit (HOSPITAL_BASED_OUTPATIENT_CLINIC_OR_DEPARTMENT_OTHER): Payer: Medicare Other | Admitting: Physical Therapy

## 2023-01-04 ENCOUNTER — Other Ambulatory Visit: Payer: Self-pay | Admitting: Family Medicine

## 2023-02-10 ENCOUNTER — Ambulatory Visit: Payer: Medicare Other | Admitting: Family Medicine

## 2023-02-11 ENCOUNTER — Ambulatory Visit (INDEPENDENT_AMBULATORY_CARE_PROVIDER_SITE_OTHER): Payer: Medicare Other | Admitting: Family Medicine

## 2023-02-11 ENCOUNTER — Encounter: Payer: Self-pay | Admitting: Family Medicine

## 2023-02-11 VITALS — BP 154/77 | HR 65 | Ht 63.5 in | Wt 190.0 lb

## 2023-02-11 DIAGNOSIS — B379 Candidiasis, unspecified: Secondary | ICD-10-CM | POA: Diagnosis not present

## 2023-02-11 DIAGNOSIS — G4733 Obstructive sleep apnea (adult) (pediatric): Secondary | ICD-10-CM | POA: Diagnosis not present

## 2023-02-11 DIAGNOSIS — N949 Unspecified condition associated with female genital organs and menstrual cycle: Secondary | ICD-10-CM

## 2023-02-11 DIAGNOSIS — H6120 Impacted cerumen, unspecified ear: Secondary | ICD-10-CM | POA: Insufficient documentation

## 2023-02-11 DIAGNOSIS — N898 Other specified noninflammatory disorders of vagina: Secondary | ICD-10-CM

## 2023-02-11 DIAGNOSIS — Z13220 Encounter for screening for lipoid disorders: Secondary | ICD-10-CM

## 2023-02-11 DIAGNOSIS — M1711 Unilateral primary osteoarthritis, right knee: Secondary | ICD-10-CM | POA: Diagnosis not present

## 2023-02-11 DIAGNOSIS — H6123 Impacted cerumen, bilateral: Secondary | ICD-10-CM | POA: Diagnosis not present

## 2023-02-11 DIAGNOSIS — J449 Chronic obstructive pulmonary disease, unspecified: Secondary | ICD-10-CM

## 2023-02-11 DIAGNOSIS — I480 Paroxysmal atrial fibrillation: Secondary | ICD-10-CM | POA: Diagnosis not present

## 2023-02-11 DIAGNOSIS — I10 Essential (primary) hypertension: Secondary | ICD-10-CM | POA: Diagnosis not present

## 2023-02-11 LAB — POCT URINALYSIS DIP (CLINITEK)
Bilirubin, UA: NEGATIVE
Glucose, UA: NEGATIVE mg/dL
Ketones, POC UA: NEGATIVE mg/dL
Leukocytes, UA: NEGATIVE
Nitrite, UA: NEGATIVE
POC PROTEIN,UA: NEGATIVE
Spec Grav, UA: >=1.03 — AB (ref 1.010–1.025)
Urobilinogen, UA: 0.2 E.U./dL
pH, UA: 5.5 (ref 5.0–8.0)

## 2023-02-11 MED ORDER — MELOXICAM 7.5 MG PO TABS: ORAL_TABLET | ORAL | 0 refills | Status: DC

## 2023-02-11 MED ORDER — FLUCONAZOLE 150 MG PO TABS: 150.0000 mg | ORAL_TABLET | Freq: Once | ORAL | 0 refills | Status: AC

## 2023-02-11 NOTE — Assessment & Plan Note (Signed)
Stable with current inhalers.  Recommend continuation. °

## 2023-02-11 NOTE — Assessment & Plan Note (Signed)
Referral to sleep studies  

## 2023-02-11 NOTE — Progress Notes (Signed)
Pt states yeast infection with use of doxycycline.

## 2023-02-11 NOTE — Assessment & Plan Note (Addendum)
Using roll on muscle rub.  She may continue meloxicam as needed as well

## 2023-02-11 NOTE — Patient Instructions (Addendum)
Take fluconazole for vaginal itching/irritation.

## 2023-02-11 NOTE — Assessment & Plan Note (Addendum)
Questionable history of A. Fib.  Has had palpitations in the past, however cardiologist in [place] disagreed with dx of A. Fib.  Remains on metoprolol.  Denies new palpitations.

## 2023-02-11 NOTE — Assessment & Plan Note (Signed)
Good relief with lavage.  Recommend nurse visit in 3 months for recheck.

## 2023-02-11 NOTE — Progress Notes (Signed)
Kimberly Jackson - 77 y.o. female MRN [id number]  Date of birth: 1946/05/25  Subjective Chief Complaint  Patient presents with   Hypertension   Urinary Tract Infection    HPI Kimberly Jackson is a 77 y.o. female here today for follow up.   She reports that she is doing ok.  Has been traveling for pretty much the past month. .  She has a history of possible A. fib and HTN.  Told by PCP in [place] she may have A. Fib, however cardiologist disagreed.  Remains on metoprolol for rate control. Also on losartan and Maxzide for management of HTN.  Initial BP is elevated today. She denies side effects from medication.  She has not had chest pain, shortness of breath, palpitations, headache or vision changes.   Thinks she has excess wax.  Having some fullness in her ears and difficulty with hearing. She has had problems with excess wax in the past.  She is having some vaginal irritation as well as urinary frequency and urgency.  She has had symptoms for a few days.  Denies blood in urine.   COPD remains pretty well controlled with Advair daily and albuterol as needed.   Diagnosed with OSA in the past.  Would like to have re-evaluation for this.   ROS:  A comprehensive ROS was completed and negative except as noted per HPI  Allergies  Allergen Reactions   Corticosteroids Other (See Comments)    Suicidal   Prochlorperazine Other (See Comments)    Acute dystonic reaction type   Cortisone    Egg-Derived Products Nausea And Vomiting   Nickel Rash    skin   Penicillins Rash    Past Medical History:  Diagnosis Date   Alcohol abuse    COPD (chronic obstructive pulmonary disease) (HCC)    Depression    Hypertension    OSA (obstructive sleep apnea)    Reflux     Past Surgical History:  Procedure Laterality Date   ABDOMINAL HYSTERECTOMY     APPENDECTOMY     77 yo   BLADDER SURGERY  2015   CHOLECYSTECTOMY     REPLACEMENT TOTAL KNEE     ROTATOR CUFF REPAIR     TONSILLECTOMY       Social History   Socioeconomic History   Marital status: Married    Spouse name: Not on file   Number of children: 3   Years of education: Not on file   Highest education level: Not on file  Occupational History   Not on file  Tobacco Use   Smoking status: Never   Smokeless tobacco: Never  Vaping Use   Vaping status: Never Used  Substance and Sexual Activity   Alcohol use: No    Alcohol/week: 0.0 standard drinks of alcohol   Drug use: No   Sexual activity: Not on file  Other Topics Concern   Not on file  Social History Narrative   Not on file   Social Determinants of Health   Financial Resource Strain: Not on file  Food Insecurity: Not on file  Transportation Needs: Not on file  Physical Activity: Not on file  Stress: Unknown (07/02/2021)   Received from [facility] - Occupational Stress Questionnaire    Feeling of Stress : Patient declined  Social Connections: Unknown (12/03/2021)   [facility] from [facility]   Social Network    Social Network: Not on file    Family History  Problem Relation Age of Onset   Alcohol abuse Mother    Atrial fibrillation Father    Breast cancer Sister 40   Breast cancer Maternal Aunt    Breast cancer Paternal Aunt     Health Maintenance  Topic Date Due   Medicare Annual Wellness (AWV)  04/22/2023 (Originally 03/27/2019)   Zoster Vaccines- Shingrix (1 of 2) 05/14/2023 (Originally 10/23/1964)   Colonoscopy  09/30/2023 (Originally 09/16/2022)   COVID-19 Vaccine (3 - Pfizer risk series) 10/16/2023 (Originally 11/23/2019)   INFLUENZA VACCINE  02/20/2023   DTaP/Tdap/Td (2 - Td or Tdap) 11/19/2024   Pneumonia Vaccine 33+ Years old  Completed   DEXA SCAN  Completed   Hepatitis C Screening  Completed   HPV VACCINES  Aged Out      ----------------------------------------------------------------------------------------------------------------------------------------------------------------------------------------------------------------- Physical Exam BP (!) 154/77 (BP Location: Right Arm, Patient Position: Sitting, Cuff Size: Large)   Pulse 65   Ht 5' 3.5" (1.613 m)   Wt 190 lb (86.2 kg)   SpO2 99%   BMI 33.13 kg/m   Physical Exam Constitutional:      Appearance: Normal appearance.  HENT:     Ears:     Comments: B/L cerumen impaction.  Eyes:     General: No scleral icterus. Cardiovascular:     Rate and Rhythm: Normal rate and regular rhythm.  Pulmonary:     Effort: Pulmonary effort is normal.     Breath sounds: Normal breath sounds.  Neurological:     Mental Status: She is alert.  Psychiatric:        Mood and Affect: Mood normal.        Behavior: Behavior normal.     ------------------------------------------------------------------------------------------------------------------------------------------------------------------------------------------------------------------- Assessment and Plan  Paroxysmal a-fib (HCC) Questionable history of A. Fib.  Has had palpitations in the past, however cardiologist in [place] disagreed with dx of A. Fib.  Remains on metoprolol.  Denies new palpitations.   COPD (chronic obstructive pulmonary disease) (HCC) Stable with current inhalers.  Recommend continuation.   Yeast infection Rx for fluconazole sent in.   Right knee DJD Using roll on muscle rub.  She may continue meloxicam as needed as well   OSA and COPD overlap syndrome (HCC) Referral to sleep studies.  Cerumen impaction Good relief with lavage.  Recommend nurse visit in 3 months for recheck.    Meds ordered this encounter  Medications   meloxicam (MOBIC) 7.5 MG tablet    Sig: Take daily x 2 weeks then as needed. Take with food    Dispense:  30 tablet    Refill:  0   fluconazole  (DIFLUCAN) 150 MG tablet    Sig: Take 1 tablet (150 mg total) by mouth once for 1 dose. Repeat in 72 hours if needed.    Dispense:  1 tablet    Refill:  0    No follow-ups on file.    This visit occurred during the SARS-CoV-2 public health emergency.  Safety protocols were in place, including screening questions prior to the visit, additional usage of staff PPE, and extensive cleaning of exam room while observing appropriate contact time as indicated for disinfecting solutions.

## 2023-02-11 NOTE — Assessment & Plan Note (Signed)
Rx for fluconazole sent in.  

## 2023-02-12 LAB — CMP14+EGFR
ALT: 20 IU/L (ref 0–32)
AST: 23 IU/L (ref 0–40)
Albumin: 4.6 g/dL (ref 3.8–4.8)
Alkaline Phosphatase: 127 IU/L — ABNORMAL HIGH (ref 44–121)
BUN/Creatinine Ratio: 34 — ABNORMAL HIGH (ref 12–28)
BUN: 38 mg/dL — ABNORMAL HIGH (ref 8–27)
Bilirubin Total: 0.5 mg/dL (ref 0.0–1.2)
CO2: 25 mmol/L (ref 20–29)
Calcium: 9.7 mg/dL (ref 8.7–10.3)
Chloride: 102 mmol/L (ref 96–106)
Creatinine, Ser: 1.13 mg/dL — ABNORMAL HIGH (ref 0.57–1.00)
Globulin, Total: 2.6 g/dL (ref 1.5–4.5)
Glucose: 91 mg/dL (ref 70–99)
Potassium: 4.6 mmol/L (ref 3.5–5.2)
Sodium: 141 mmol/L (ref 134–144)
Total Protein: 7.2 g/dL (ref 6.0–8.5)
eGFR: 49 mL/min/{1.73_m2} — ABNORMAL LOW (ref >59)

## 2023-02-12 LAB — LIPID PANEL WITH LDL/HDL RATIO
Cholesterol, Total: 210 mg/dL — ABNORMAL HIGH (ref 100–199)
HDL: 63 mg/dL (ref >39)
LDL Chol Calc (NIH): 121 mg/dL — ABNORMAL HIGH (ref 0–99)
LDL/HDL Ratio: 1.9 ratio (ref 0.0–3.2)
Triglycerides: 148 mg/dL (ref 0–149)
VLDL Cholesterol Cal: 26 mg/dL (ref 5–40)

## 2023-03-07 ENCOUNTER — Other Ambulatory Visit: Payer: Self-pay | Admitting: Family Medicine

## 2023-03-10 ENCOUNTER — Other Ambulatory Visit: Payer: Self-pay | Admitting: Family Medicine

## 2023-05-14 ENCOUNTER — Ambulatory Visit (INDEPENDENT_AMBULATORY_CARE_PROVIDER_SITE_OTHER): Payer: Medicare Other | Admitting: Family Medicine

## 2023-05-14 VITALS — BP 145/80 | HR 63 | Ht 63.5 in | Wt 190.0 lb

## 2023-05-14 DIAGNOSIS — N898 Other specified noninflammatory disorders of vagina: Secondary | ICD-10-CM

## 2023-05-14 DIAGNOSIS — H6120 Impacted cerumen, unspecified ear: Secondary | ICD-10-CM

## 2023-05-14 MED ORDER — FLUCONAZOLE 150 MG PO TABS
150.0000 mg | ORAL_TABLET | Freq: Once | ORAL | 0 refills | Status: DC
Start: 2023-05-14 — End: 2023-05-14

## 2023-05-14 MED ORDER — FLUCONAZOLE 150 MG PO TABS: 150.0000 mg | ORAL_TABLET | Freq: Once | ORAL | 0 refills | Status: AC

## 2023-05-14 NOTE — Progress Notes (Signed)
Pt presented for cerumen impaction ear lavage. No wax accumulation present. Verified by Dr. Tamera Punt.   Pt stated yeast infection. Medication sent per Tamera Punt.

## 2023-05-14 NOTE — Progress Notes (Signed)
Patient ID: Kimberly Jackson, female   DOB: 12-14-1945, 77 y.o.   MRN: [id number] Medical screening examination/treatment was performed by qualified clinical staff member and as supervising physician I was immediately available for consultation/collaboration. I have reviewed documentation and agree with assessment and plan.  Colbert Coyer. Tamera Punt, DO

## 2023-05-28 ENCOUNTER — Other Ambulatory Visit: Payer: Self-pay | Admitting: Family Medicine

## 2023-06-07 ENCOUNTER — Other Ambulatory Visit: Payer: Self-pay | Admitting: Family Medicine

## 2023-07-21 ENCOUNTER — Other Ambulatory Visit: Payer: Self-pay | Admitting: Family Medicine

## 2023-07-24 ENCOUNTER — Other Ambulatory Visit: Payer: Self-pay | Admitting: Family Medicine

## 2023-07-24 ENCOUNTER — Other Ambulatory Visit: Payer: Self-pay

## 2023-07-24 MED ORDER — METOPROLOL SUCCINATE ER 100 MG PO TB24: 100.0000 mg | ORAL_TABLET | Freq: Every day | ORAL | 3 refills | Status: DC

## 2023-07-24 NOTE — Telephone Encounter (Signed)
 VOB initiated for 2025 coverage of Orthovisc.

## 2023-07-30 ENCOUNTER — Other Ambulatory Visit: Payer: Self-pay

## 2023-07-30 NOTE — Telephone Encounter (Signed)
 Medical Buy and Annette Stable - Prior Authorization NOT required

## 2023-07-30 NOTE — Telephone Encounter (Signed)
 Medical Buy and Bill  ORTHOVISC for RIGHT knee OA OK to schedule on or after 07/23/23  Primary Insurance: Medicare Co-Pay: n/a Co-Insurance: 20% Deductible: $0 of $257 met - must be met for coverage to apply  Prior Authorization: NOT required  Secondary Insurance: Tricare Co-Pay: n/a Co-Insurance: Covers Medicare Part B co-insurance Deductible: does NOT cover Medicare deductible  Prior Authorization: NOT required

## 2023-08-01 ENCOUNTER — Telehealth: Payer: Self-pay

## 2023-08-01 DIAGNOSIS — I10 Essential (primary) hypertension: Secondary | ICD-10-CM

## 2023-08-01 MED ORDER — METOPROLOL SUCCINATE ER 100 MG PO TB24: 100.0000 mg | ORAL_TABLET | Freq: Every day | ORAL | 0 refills | Status: DC

## 2023-08-01 NOTE — Telephone Encounter (Signed)
 Copied from CRM [id number]. Topic: Clinical - Prescription Issue >> Aug 01, 2023  8:36 AM Graeme ORN wrote: Reason for CRM: Patient called to request refill for metoprolol  succinate (TOPROL -XL) 100 MG 24 hr tablet. Advised ordered 01/02. Patient states sent to incorrect pharmacy. She needs it sent to [id number] [address] [place], [place] - [address] AT [facility] [address] as she previously had it sent. She is completely out. Thank You

## 2023-08-04 NOTE — Telephone Encounter (Signed)
 Copied from CRM [id number]. Topic: Clinical - Prescription Issue >> Aug 01, 2023 11:04 AM Deleta HERO wrote: Reason for CRM: metoprolol  succinate (TOPROL -XL) 100 MG 24 hr tablet has been sent to the incorrect pharmacy, this patient needs this sent to [id number] [address] [place], [place] - [address] AT [facility] [address] instead. She would like to make sure this can be completed before [facility] closes today, they may close early due to inclement weather for safety purposes.

## 2023-08-05 NOTE — Telephone Encounter (Signed)
 Checked in patient chart- script was sent to [facility] [place] on 08/01/23.

## 2023-08-18 ENCOUNTER — Ambulatory Visit (INDEPENDENT_AMBULATORY_CARE_PROVIDER_SITE_OTHER): Payer: Medicare Other | Admitting: Family Medicine

## 2023-08-18 ENCOUNTER — Encounter: Payer: Self-pay | Admitting: Family Medicine

## 2023-08-18 VITALS — BP 101/65 | HR 66 | Ht 63.5 in | Wt 196.0 lb

## 2023-08-18 DIAGNOSIS — I480 Paroxysmal atrial fibrillation: Secondary | ICD-10-CM

## 2023-08-18 DIAGNOSIS — J449 Chronic obstructive pulmonary disease, unspecified: Secondary | ICD-10-CM

## 2023-08-18 DIAGNOSIS — I10 Essential (primary) hypertension: Secondary | ICD-10-CM | POA: Diagnosis not present

## 2023-08-18 MED ORDER — APIXABAN 2.5 MG PO TABS: 2.5000 mg | ORAL_TABLET | Freq: Two times a day (BID) | ORAL | 1 refills | Status: DC

## 2023-08-18 MED ORDER — DOXYCYCLINE HYCLATE 100 MG PO TABS: 100.0000 mg | ORAL_TABLET | Freq: Two times a day (BID) | ORAL | 0 refills | Status: AC

## 2023-08-18 MED ORDER — IPRATROPIUM-ALBUTEROL 20-100 MCG/ACT IN AERS: 1.0000 | INHALATION_SPRAY | Freq: Four times a day (QID) | RESPIRATORY_TRACT | 5 refills | Status: AC | PRN

## 2023-08-18 MED ORDER — FLUTICASONE-SALMETEROL 230-21 MCG/ACT IN AERO: 2.0000 | INHALATION_SPRAY | Freq: Two times a day (BID) | RESPIRATORY_TRACT | 1 refills | Status: DC

## 2023-08-18 MED ORDER — METOPROLOL SUCCINATE ER 100 MG PO TB24: 150.0000 mg | ORAL_TABLET | Freq: Every day | ORAL | 1 refills | Status: DC

## 2023-08-18 NOTE — Addendum Note (Signed)
Addended by: Ardyth Man on: 08/18/2023 10:25 AM   Modules accepted: Orders

## 2023-08-18 NOTE — Progress Notes (Signed)
Kimberly Jackson - 78 y.o. female MRN [id number]  Date of birth: 08-Mar-1946  Subjective Chief Complaint  Patient presents with   Back Pain   Atrial Fibrillation    HPI Kimberly Jackson is a 78 y.o. female here today for follow up visit.   BP remains well controlled on losartan, maxzide and metoprolol.  She does have reported history of PAF as well, but unable to capture on previous EKG.  Remains rate controlled.  She does get some palpitations time to time and feels like her heart is racing off and on, especially with activity. .  She is not on anticoagulation at this time.    COPD is well controlled overall.  Remains on advair daily with combivent as needed. She has had a cold this week and has noted a little more wheezing.  She has had some increased sputum  ROS:  A comprehensive ROS was completed and negative except as noted per HPI    Allergies  Allergen Reactions   Corticosteroids Other (See Comments)    Suicidal   Prochlorperazine Other (See Comments)    Acute dystonic reaction type   Cortisone    Egg-Derived Products Nausea And Vomiting   Nickel Rash    skin   Penicillins Rash    Past Medical History:  Diagnosis Date   Alcohol abuse    COPD (chronic obstructive pulmonary disease) (HCC)    Depression    Hypertension    OSA (obstructive sleep apnea)    Reflux     Past Surgical History:  Procedure Laterality Date   ABDOMINAL HYSTERECTOMY     APPENDECTOMY     78 yo   BLADDER SURGERY  2015   CHOLECYSTECTOMY     REPLACEMENT TOTAL KNEE     ROTATOR CUFF REPAIR     TONSILLECTOMY      Social History   Socioeconomic History   Marital status: Married    Spouse name: Not on file   Number of children: 3   Years of education: Not on file   Highest education level: Not on file  Occupational History   Not on file  Tobacco Use   Smoking status: Never   Smokeless tobacco: Never  Vaping Use   Vaping status: Never Used  Substance and Sexual Activity   Alcohol  use: No    Alcohol/week: 0.0 standard drinks of alcohol   Drug use: No   Sexual activity: Not on file  Other Topics Concern   Not on file  Social History Narrative   Not on file   Social Drivers of Health   Financial Resource Strain: Not on file  Food Insecurity: Not on file  Transportation Needs: Not on file  Physical Activity: Not on file  Stress: Unknown (07/02/2021)   Received from [facility]   [employer] of Occupational Health - Occupational Stress Questionnaire    Feeling of Stress : Patient declined  Social Connections: Unknown (12/03/2021)   Received from [facility], [facility]   Social Network    Social Network: Not on file    Family History  Problem Relation Age of Onset   Alcohol abuse Mother    Atrial fibrillation Father    Breast cancer Sister 52   Breast cancer Maternal Aunt    Breast cancer Paternal Aunt     Health Maintenance  Topic Date Due   Zoster Vaccines- Shingrix (1 of 2) 10/23/1962   Medicare Annual Wellness (AWV)  03/27/2019   INFLUENZA  VACCINE  02/20/2023   Colonoscopy  09/30/2023 (Originally 09/16/2022)   COVID-19 Vaccine (3 - Pfizer risk series) 10/16/2023 (Originally 11/23/2019)   DTaP/Tdap/Td (2 - Td or Tdap) 11/19/2024   Pneumonia Vaccine 61+ Years old  Completed   DEXA SCAN  Completed   Hepatitis C Screening  Completed   HPV VACCINES  Aged Out     ----------------------------------------------------------------------------------------------------------------------------------------------------------------------------------------------------------------- Physical Exam BP 101/65 (BP Location: Left Arm, Patient Position: Sitting, Cuff Size: Normal)   Pulse 66   Ht 5' 3.5" (1.613 m)   Wt 196 lb (88.9 kg)   SpO2 100%   BMI 34.18 kg/m   Physical Exam Constitutional:      Appearance: Normal appearance.  Eyes:     General: No scleral icterus. Cardiovascular:     Rate and Rhythm: Rhythm irregular.      Comments: IRR Pulmonary:     Effort: Pulmonary effort is normal.     Breath sounds: Wheezing present.  Neurological:     Mental Status: She is alert.  Psychiatric:        Mood and Affect: Mood normal.        Behavior: Behavior normal.     ------------------------------------------------------------------------------------------------------------------------------------------------------------------------------------------------------------------- Assessment and Plan  Paroxysmal a-fib (HCC) EKG today with A. Fib.  No ischemic changes.  Hemodynamically stable and relatively asymptomatic. Will increase toprol slightly as she has had increased tachycardia. Start anticoagulation for CHADS-VASc score of 4. Start eliquis at 2.5mg  BID as she will be 78 in a couple of months.   Will get her back in with cardiology.   HTN (hypertension) BP is well controlled.  Increasing toprol slightly to   COPD (chronic obstructive pulmonary disease) (HCC) Recent exacerbation.  Unable to tolerate steroids.  Adding course of doxycyline. Continue current inhalers.    Meds ordered this encounter  Medications   doxycycline (VIBRA-TABS) 100 MG tablet    Sig: Take 1 tablet (100 mg total) by mouth 2 (two) times daily for 7 days.    Dispense:  14 tablet    Refill:  0   apixaban (ELIQUIS) 2.5 MG TABS tablet    Sig: Take 1 tablet (2.5 mg total) by mouth 2 (two) times daily.    Dispense:  180 tablet    Refill:  1   metoprolol succinate (TOPROL-XL) 100 MG 24 hr tablet    Sig: Take 1.5 tablets (150 mg total) by mouth daily. Take with or immediately following a meal.    Dispense:  135 tablet    Refill:  1    No follow-ups on file.    This visit occurred during the SARS-CoV-2 public health emergency.  Safety protocols were in place, including screening questions prior to the visit, additional usage of staff PPE, and extensive cleaning of exam room while observing appropriate contact time as indicated for  disinfecting solutions.

## 2023-08-18 NOTE — Addendum Note (Signed)
Addended by: Ardyth Man on: 08/18/2023 10:14 AM   Modules accepted: Orders

## 2023-08-18 NOTE — Assessment & Plan Note (Signed)
BP is well controlled.  Increasing toprol slightly to

## 2023-08-18 NOTE — Assessment & Plan Note (Signed)
EKG today with A. Fib.  No ischemic changes.  Hemodynamically stable and relatively asymptomatic. Will increase toprol slightly as she has had increased tachycardia. Start anticoagulation for CHADS-VASc score of 4. Start eliquis at 2.5mg  BID as she will be 78 in a couple of months.   Will get her back in with cardiology.

## 2023-08-18 NOTE — Assessment & Plan Note (Signed)
Recent exacerbation.  Unable to tolerate steroids.  Adding course of doxycyline. Continue current inhalers.

## 2023-08-18 NOTE — Addendum Note (Signed)
Addended by: Ardyth Man on: 08/18/2023 10:29 AM   Modules accepted: Orders

## 2023-08-18 NOTE — Patient Instructions (Signed)
Increase toprol to 1.5 tablets daily.  I have placed a referral to get you back in with cardiology.  Start eliquis for stroke prevention.    Try astepro for the fluid in ears.

## 2023-08-19 ENCOUNTER — Encounter: Payer: Self-pay | Admitting: Family Medicine

## 2023-08-19 ENCOUNTER — Other Ambulatory Visit: Payer: Self-pay | Admitting: Family Medicine

## 2023-08-19 DIAGNOSIS — R7989 Other specified abnormal findings of blood chemistry: Secondary | ICD-10-CM

## 2023-08-19 LAB — BASIC METABOLIC PANEL
BUN/Creatinine Ratio: 21 (ref 12–28)
BUN: 32 mg/dL — ABNORMAL HIGH (ref 8–27)
CO2: 21 mmol/L (ref 20–29)
Calcium: 9.9 mg/dL (ref 8.7–10.3)
Chloride: 101 mmol/L (ref 96–106)
Creatinine, Ser: 1.5 mg/dL — ABNORMAL HIGH (ref 0.57–1.00)
Glucose: 99 mg/dL (ref 70–99)
Potassium: 4.5 mmol/L (ref 3.5–5.2)
Sodium: 140 mmol/L (ref 134–144)
eGFR: 35 mL/min/{1.73_m2} — ABNORMAL LOW (ref >59)

## 2023-08-19 LAB — MAGNESIUM: Magnesium: 1.9 mg/dL (ref 1.6–2.3)

## 2023-09-04 ENCOUNTER — Other Ambulatory Visit: Payer: Self-pay | Admitting: Family Medicine

## 2023-09-18 DIAGNOSIS — H26493 Other secondary cataract, bilateral: Secondary | ICD-10-CM | POA: Diagnosis not present

## 2023-09-18 DIAGNOSIS — H52203 Unspecified astigmatism, bilateral: Secondary | ICD-10-CM | POA: Diagnosis not present

## 2023-09-18 DIAGNOSIS — H04123 Dry eye syndrome of bilateral lacrimal glands: Secondary | ICD-10-CM | POA: Diagnosis not present

## 2023-09-18 DIAGNOSIS — Z961 Presence of intraocular lens: Secondary | ICD-10-CM | POA: Diagnosis not present

## 2023-09-18 DIAGNOSIS — H526 Other disorders of refraction: Secondary | ICD-10-CM | POA: Diagnosis not present

## 2023-09-18 DIAGNOSIS — H47323 Drusen of optic disc, bilateral: Secondary | ICD-10-CM | POA: Diagnosis not present

## 2023-09-19 ENCOUNTER — Other Ambulatory Visit: Payer: Self-pay | Admitting: Family Medicine

## 2023-09-19 DIAGNOSIS — Z1231 Encounter for screening mammogram for malignant neoplasm of breast: Secondary | ICD-10-CM

## 2023-09-23 ENCOUNTER — Ambulatory Visit (INDEPENDENT_AMBULATORY_CARE_PROVIDER_SITE_OTHER): Payer: Medicare Other

## 2023-09-23 VITALS — BP 135/53 | HR 57 | Ht 64.0 in | Wt 204.0 lb

## 2023-09-23 DIAGNOSIS — Z23 Encounter for immunization: Secondary | ICD-10-CM

## 2023-09-23 DIAGNOSIS — Z0000 Encounter for general adult medical examination without abnormal findings: Secondary | ICD-10-CM

## 2023-09-23 NOTE — Patient Instructions (Signed)
  Kimberly Jackson , Thank you for taking time to come for your Medicare Wellness Visit. I appreciate your ongoing commitment to your health goals. Please review the following plan we discussed and let me know if I can assist you in the future.   These are the goals we discussed:  Goals      Weight (lb) < 200 lb (90.7 kg)     She would like to lose weight.         This is a list of the screening recommended for you and due dates:  Health Maintenance  Topic Date Due   Colon Cancer Screening  09/30/2023*   COVID-19 Vaccine (3 - Pfizer risk series) 10/16/2023*   Zoster (Shingles) Vaccine (1 of 2) 11/16/2023*   Mammogram  06/20/2024   Medicare Annual Wellness Visit  09/22/2024   DTaP/Tdap/Td vaccine (2 - Td or Tdap) 11/19/2024   Pneumonia Vaccine  Completed   Flu Shot  Completed   DEXA scan (bone density measurement)  Completed   Hepatitis C Screening  Completed   HPV Vaccine  Aged Out  *Topic was postponed. The date shown is not the original due date.

## 2023-09-23 NOTE — Progress Notes (Signed)
 Subjective:   Kimberly Jackson is a 78 y.o. female who presents for Medicare Annual (Subsequent) preventive examination.  Visit Complete: In person  Patient Medicare AWV questionnaire was completed by the patient on n/a; I have confirmed that all information answered by patient is correct and no changes since this date.  Cardiac Risk Factors include: advanced age (>66men, >28 women);hypertension;obesity (BMI >30kg/m2)     Objective:    Today's Vitals   09/23/23 0939  BP: (!) 135/53  Pulse: (!) 57  SpO2: 100%  Weight: 204 lb (92.5 kg)  Height: 5\' 4"  (1.626 m)   Body mass index is 35.02 kg/m.     09/23/2023    9:48 AM 11/20/2022    9:33 AM 01/10/2017   11:09 AM 09/04/2015    2:53 PM  Advanced Directives  Does Patient Have a Medical Advance Directive? No Yes No No  Type of Advance Directive  Living will;Healthcare Power of Attorney    Copy of Healthcare Power of Attorney in Chart?  No - copy requested    Would patient like information on creating a medical advance directive? Yes (ED - Information included in AVS)  No - Patient declined No - patient declined information    Current Medications (verified) Outpatient Encounter Medications as of 09/23/2023  Medication Sig   AMBULATORY NON FORMULARY MEDICATION Blood pressure monitor use daily for hypertension.   diclofenac Sodium (VOLTAREN) 1 % GEL APPLY 2 GRAMS TOPICALLY TO THE AFFECTED JOINT FOUR TIMES DAILY   fluticasone-salmeterol (ADVAIR HFA) 230-21 MCG/ACT inhaler Inhale 2 puffs into the lungs 2 (two) times daily.   Ipratropium-Albuterol (COMBIVENT) 20-100 MCG/ACT AERS respimat Inhale 1 puff into the lungs every 6 (six) hours as needed for wheezing or shortness of breath.   losartan (COZAAR) 100 MG tablet TAKE 1 TABLET(100 MG) BY MOUTH DAILY   magnesium gluconate (MAGONATE) 500 MG tablet Take 500 mg by mouth 2 (two) times daily.   meloxicam (MOBIC) 7.5 MG tablet TAKE 1 TABLET BY MOUTH EVERY DAY FOR 2 WEEKS, THEN AS NEEDED, TAKE  WITH FOOD   metoprolol succinate (TOPROL-XL) 100 MG 24 hr tablet Take 1.5 tablets (150 mg total) by mouth daily. Take with or immediately following a meal.   omeprazole (PRILOSEC) 40 MG capsule TAKE 1 CAPSULE(40 MG) BY MOUTH DAILY   triamterene-hydrochlorothiazide (MAXZIDE-25) 37.5-25 MG tablet TAKE 1 TABLET BY MOUTH DAILY   apixaban (ELIQUIS) 2.5 MG TABS tablet Take 1 tablet (2.5 mg total) by mouth 2 (two) times daily.   No facility-administered encounter medications on file as of 09/23/2023.    Allergies (verified) Corticosteroids, Prochlorperazine, Cortisone, Egg-derived products, Nickel, and Penicillins   History: Past Medical History:  Diagnosis Date   Alcohol abuse    COPD (chronic obstructive pulmonary disease) (HCC)    Depression    Hypertension    OSA (obstructive sleep apnea)    Reflux    Past Surgical History:  Procedure Laterality Date   ABDOMINAL HYSTERECTOMY     APPENDECTOMY     78 yo   BLADDER SURGERY  2015   CHOLECYSTECTOMY     REPLACEMENT TOTAL KNEE     ROTATOR CUFF REPAIR     TONSILLECTOMY     Family History  Problem Relation Age of Onset   Alcohol abuse Mother    Atrial fibrillation Father    Breast cancer Sister 71   Breast cancer Maternal Aunt    Breast cancer Paternal Aunt    Social History   Socioeconomic History  Marital status: Married    Spouse name: Not on file   Number of children: 3   Years of education: Not on file   Highest education level: Not on file  Occupational History   Not on file  Tobacco Use   Smoking status: Never   Smokeless tobacco: Never  Vaping Use   Vaping status: Never Used  Substance and Sexual Activity   Alcohol use: No    Alcohol/week: 0.0 standard drinks of alcohol   Drug use: No   Sexual activity: Not on file  Other Topics Concern   Not on file  Social History Narrative   Joyce Gross lives with her husband. She has 3 children. She loves painting.    Social Drivers of Corporate investment banker Strain: Low  Risk  (09/23/2023)   Overall Financial Resource Strain (CARDIA)    Difficulty of Paying Living Expenses: Not hard at all  Food Insecurity: No Food Insecurity (09/23/2023)   Hunger Vital Sign    Worried About Running Out of Food in the Last Year: Never true    Ran Out of Food in the Last Year: Never true  Transportation Needs: No Transportation Needs (09/23/2023)   PRAPARE - Administrator, Civil Service (Medical): No    Lack of Transportation (Non-Medical): No  Physical Activity: Sufficiently Active (09/23/2023)   Exercise Vital Sign    Days of Exercise per Week: 5 days    Minutes of Exercise per Session: 60 min  Stress: No Stress Concern Present (09/23/2023)   [employer] of Occupational Health - Occupational Stress Questionnaire    Feeling of Stress : Not at all  Social Connections: Socially Integrated (09/23/2023)   Social Connection and Isolation Panel [NHANES]    Frequency of Communication with Friends and Family: More than three times a week    Frequency of Social Gatherings with Friends and Family: More than three times a week    Attends Religious Services: More than 4 times per year    Active Member of [employer] or Organizations: Yes    Attends Engineer, structural: More than 4 times per year    Marital Status: Married    Tobacco Counseling Counseling given: Not Answered   Clinical Intake:  Pre-visit preparation completed: Yes  Pain : No/denies pain     BMI - recorded: 35.02 Nutritional Status: BMI > 30  Obese Nutritional Risks: None Diabetes: No  How often do you need to have someone help you when you read instructions, pamphlets, or other written materials from your doctor or pharmacy?: 1 - Never What is the last grade level you completed in school?: 8  Interpreter Needed?: No      Activities of Daily Living    09/23/2023    9:41 AM  In your present state of health, do you have any difficulty performing the following activities:  Hearing? 0   Vision? 0  Difficulty concentrating or making decisions? 0  Walking or climbing stairs? 0  Dressing or bathing? 0  Doing errands, shopping? 0  Preparing Food and eating ? N  Using the Toilet? N  In the past six months, have you accidently leaked urine? N  Do you have problems with loss of bowel control? N  Managing your Medications? N  Managing your Finances? N  Housekeeping or managing your Housekeeping? N    Patient Care Team: Everrett Coombe, DO as PCP - General (Family Medicine)  Indicate any recent Medical Services you may have received  from other than [facility] providers in the past year (date may be approximate).     Assessment:   This is a routine wellness examination for [facility].  Hearing/Vision screen No results found.   Goals Addressed             This Visit's Progress    Weight (lb) < 200 lb (90.7 kg)   204 lb (92.5 kg)    She would like to lose weight.        Depression Screen    09/23/2023    9:46 AM 08/18/2023   10:28 AM 02/11/2023    9:26 AM 11/04/2022    1:19 PM 08/12/2022    9:08 AM 05/13/2022    1:24 PM 02/06/2022    9:01 AM  PHQ 2/9 Scores  PHQ - 2 Score 0 0 0 0 0 0 0    Fall Risk    09/23/2023    9:48 AM 08/18/2023   10:28 AM 02/11/2023    9:26 AM 11/04/2022    1:19 PM 08/12/2022    9:07 AM  Fall Risk   Falls in the past year? 0 0 0 0 0  Number falls in past yr: 0 0 0 0 0  Injury with Fall? 0 0 0 0 0  Risk for fall due to : No Fall Risks No Fall Risks No Fall Risks No Fall Risks No Fall Risks  Follow up Falls evaluation completed Falls evaluation completed Falls evaluation completed Falls evaluation completed Falls evaluation completed    MEDICARE RISK AT HOME: Medicare Risk at Home Any stairs in or around the home?: Yes If so, are there any without handrails?: Yes Home free of loose throw rugs in walkways, pet beds, electrical cords, etc?: Yes Adequate lighting in your home to reduce risk of falls?: Yes Life alert?: No Use of a cane, walker  or w/c?: No Grab bars in the bathroom?: No Shower chair or bench in shower?: No Elevated toilet seat or a handicapped toilet?: No  TIMED UP AND GO:  Was the test performed?  Yes  Length of time to ambulate 10 feet: 11 sec Gait steady and fast without use of assistive device    Cognitive Function:        09/23/2023    9:48 AM 12/18/2017    9:24 AM 12/09/2016    2:50 PM  6CIT Screen  What Year? 0 points 0 points 0 points  What month? 0 points 0 points 0 points  What time? 0 points 0 points 0 points  Count back from 20 0 points 0 points 0 points  Months in reverse 0 points 0 points 0 points  Repeat phrase 2 points 0 points 0 points  Total Score 2 points 0 points 0 points    Immunizations Immunization History  Administered Date(s) Administered   Fluad Trivalent(High Dose 65+) 09/23/2023   Influenza Inj Mdck Quad Pf 04/06/2019, 04/12/2020, 05/13/2022   Influenza, High Dose Seasonal PF 06/09/2017   Influenza, Quadrivalent, Recombinant, Inj, Pf 03/26/2018   Influenza,inj,Quad PF,6+ Mos 09/03/2016   PFIZER(Purple Top)SARS-COV-2 Vaccination 09/30/2019, 10/26/2019   Pneumococcal Conjugate-13 01/04/2016   Pneumococcal Polysaccharide-23 04/21/2013   Tdap 11/20/2014   Zoster, Live 01/04/2016    TDAP status: Up to date  Flu Vaccine status: Up to date  Pneumococcal vaccine status: Up to date  Covid-19 vaccine status: Completed vaccines  Qualifies for Shingles Vaccine? Yes   Zostavax completed Yes   Shingrix Completed?: No.    Education has been provided  regarding the importance of this vaccine. Patient has been advised to call insurance company to determine out of pocket expense if they have not yet received this vaccine. Advised may also receive vaccine at local pharmacy or Health Dept. Verbalized acceptance and understanding.  Screening Tests Health Maintenance  Topic Date Due   Colonoscopy  09/30/2023 (Originally 09/16/2022)   COVID-19 Vaccine (3 - Pfizer risk series)  10/16/2023 (Originally 11/23/2019)   Zoster Vaccines- Shingrix (1 of 2) 11/16/2023 (Originally 10/23/1962)   Medicare Annual Wellness (AWV)  09/22/2024   DTaP/Tdap/Td (2 - Td or Tdap) 11/19/2024   Pneumonia Vaccine 32+ Years old  Completed   INFLUENZA VACCINE  Completed   DEXA SCAN  Completed   Hepatitis C Screening  Completed   HPV VACCINES  Aged Out    Health Maintenance  There are no preventive care reminders to display for this patient.   Colorectal cancer screening: No longer required.   Mammogram status: Completed 06/20/22. Repeat every year She has an appointment on 09/25/2023  Bone Density status: Completed 07/22/2018. Results reflect: Bone density results: NORMAL. Repeat every 2 years.  Lung Cancer Screening: (Low Dose CT Chest recommended if Age 76-80 years, 20 pack-year currently smoking OR have quit w/in 15years.) does not qualify.   Lung Cancer Screening Referral: n/a  Additional Screening:  Hepatitis C Screening: does qualify; Completed 07/22/2008  Vision Screening: Recommended annual ophthalmology exams for early detection of glaucoma and other disorders of the eye. Is the patient up to date with their annual eye exam?  Yes  Who is the provider or what is the name of the office in which the patient attends annual eye exams? Dr Keane Police If pt is not established with a provider, would they like to be referred to a provider to establish care?  N/a .   Dental Screening: Recommended annual dental exams for proper oral hygiene    Community Resource Referral / Chronic Care Management: CRR required this visit?  No   CCM required this visit?  No     Plan:     I have personally reviewed and noted the following in the patient's chart:   Medical and social history Use of alcohol, tobacco or illicit drugs  Current medications and supplements including opioid prescriptions. Patient is not currently taking opioid prescriptions. Functional ability and status Nutritional  status Physical activity Advanced directives List of other physicians Hospitalizations, surgeries, and ER visits in previous 12 months Vitals Screenings to include cognitive, depression, and falls Referrals and appointments  In addition, I have reviewed and discussed with patient certain preventive protocols, quality metrics, and best practice recommendations. A written personalized care plan for preventive services as well as general preventive health recommendations were provided to patient.     Esmond Harps, CMA   09/23/2023   After Visit Summary: (In Person-Printed) AVS printed and given to the patient  Nurse Notes:   Kimberly Jackson is a 78 y.o. y.o. female patient of Dr Ashley Royalty who had a Medicare Annual Wellness Visit today. She reports that he is socially active and does interact with friends/family regularly. She is moderately physically active. She loves to paint for a hobby.   Recommend shingles vaccine at the pharmacy.

## 2023-09-25 ENCOUNTER — Ambulatory Visit: Payer: Medicare Other

## 2023-09-30 NOTE — Progress Notes (Unsigned)
 Cardiology Office Note:  .   Date:  10/06/2023  ID:  Kimberly Jackson, DOB 1945/12/17, MRN [id number] PCP: Everrett Coombe, DO  [facility] Providers Cardiologist:  Olga Millers, MD   History of Present Illness: Kimberly Jackson   Kimberly Jackson is a 78 y.o. female with a past medical history of atrial fibrillation, aortic atherosclerosis on CT chest in 2020, palpitations, hypertension, mitral valve regurgitation.  She is followed by Dr. Jens Som, presents today for an overdue follow-up appointment.  Patient establish care with Dr. Jens Som in 09/2020 for evaluation of possible atrial fibrillation.  Patient reportedly had been told by her primary care provider in [place] that she had paroxysmal atrial fibrillation in the past.  However, a cardiologist disagreed.  There were no records to support a diagnosis for atrial fibrillation.  She remained on beta-blocker. She had echocardiogram completed in 10/2020 that showed EF 55-60%, no regional wall motion abnormalities, normal RV function, moderate mitral valve regurgitation.  Later, echocardiogram 11/2021 showed EF 55-60% with no wall motion abnormalities, grade 2 diastolic dysfunction, mild MR.  Patient was last seen by Dr. Jens Som in 04/2022.  At that time, patient had dyspnea with more extreme activities, not with routine activities.  Denied chest pain, orthopnea, PND, pedal edema.  She remained on beta-blocker.  Today, patient presents for a follow up appointment. When she was seen by her PCP in 07/2023, an EKG showed atrial fibrillation and she was started on eliquis. She is currently on metoprolol and blood thinners. She reports that if she misses a dose of metoprolol, her heart rate becomes irregular and she experiences fluttering/pounding in her chest. If she takes her metoprolol, she does not have palpitations. She also reports recent shortness of breath, especially during physical exertion such as vacuuming or walking up stairs. This has been going  on since she had a respiratory infection in 07/2023. She feels like she has not yet fully recovered from her respiratory infection. Denies cough, orthopnea. Breathing improves with use of inhalers. She also reports occasional leg cramps, particularly when she is not adequately hydrated. The patient also mentions a history of sleep apnea, but reports difficulty tolerating the CPAP machine. Reports occasional LUQ abdominal pain that occurs if she bends over at a specific angle. Believes that this is related to her hiatal hernia. Denies chest pain on exertion.   ROS: Per HPI   Studies Reviewed: .   Cardiac Studies & Procedures   ______________________________________________________________________________________________     ECHOCARDIOGRAM  ECHOCARDIOGRAM COMPLETE 11/26/2021  Narrative ECHOCARDIOGRAM REPORT    Patient Name:   Kimberly Jackson Date of Exam: 11/26/2021 Medical Rec #:  [id number]       Height:       64.0 in Accession #:    [id number]      Weight:       204.0 lb Date of Birth:  October 11, 1945        BSA:          1.973 m Patient Age:    76 years        BP:           147/48 mmHg Patient Gender: F               HR:           57 bpm. Exam Location:  [address]  Procedure: 2D Echo, Cardiac Doppler and Color Doppler  Indications:    I05.9 Mitral valve disorder  History:  Patient has prior history of Echocardiogram examinations, most recent 11/07/2020. COPD, Arrythmias:Atrial Fibrillation; Risk Factors:Hypertension and Sleep Apnea. Prediabetes.  Sonographer:    Cathie Beams RCS Referring Phys: Madolyn Frieze CRENSHAW  IMPRESSIONS   1. Left ventricular ejection fraction, by estimation, is 55 to 60%. The left ventricle has normal function. The left ventricle has no regional wall motion abnormalities. Left ventricular diastolic parameters are consistent with Grade II diastolic dysfunction (pseudonormalization). 2. Right ventricular systolic function is normal. The right  ventricular size is normal. There is normal pulmonary artery systolic pressure. The estimated right ventricular systolic pressure is 33.5 mmHg. 3. Left atrial size was moderately dilated. 4. The mitral valve is normal in structure. Mild mitral valve regurgitation. No evidence of mitral stenosis. 5. The aortic valve is tricuspid. Aortic valve regurgitation is trivial. No aortic stenosis is present. 6. The inferior vena cava is normal in size with greater than 50% respiratory variability, suggesting right atrial pressure of 3 mmHg.  FINDINGS Left Ventricle: Left ventricular ejection fraction, by estimation, is 55 to 60%. The left ventricle has normal function. The left ventricle has no regional wall motion abnormalities. The left ventricular internal cavity size was normal in size. There is no left ventricular hypertrophy. Left ventricular diastolic parameters are consistent with Grade II diastolic dysfunction (pseudonormalization).  Right Ventricle: The right ventricular size is normal. No increase in right ventricular wall thickness. Right ventricular systolic function is normal. There is normal pulmonary artery systolic pressure. The tricuspid regurgitant velocity is 2.76 m/s, and with an assumed right atrial pressure of 3 mmHg, the estimated right ventricular systolic pressure is 33.5 mmHg.  Left Atrium: Left atrial size was moderately dilated.  Right Atrium: Right atrial size was normal in size.  Pericardium: There is no evidence of pericardial effusion.  Mitral Valve: The mitral valve is normal in structure. Mild mitral valve regurgitation. No evidence of mitral valve stenosis.  Tricuspid Valve: The tricuspid valve is normal in structure. Tricuspid valve regurgitation is trivial.  Aortic Valve: The aortic valve is tricuspid. Aortic valve regurgitation is trivial. No aortic stenosis is present.  Pulmonic Valve: The pulmonic valve was normal in structure. Pulmonic valve regurgitation is  trivial.  Aorta: The aortic root is normal in size and structure.  Venous: The inferior vena cava is normal in size with greater than 50% respiratory variability, suggesting right atrial pressure of 3 mmHg.  IAS/Shunts: No atrial level shunt detected by color flow Doppler.   LEFT VENTRICLE PLAX 2D LVIDd:         4.00 cm   Diastology LVIDs:         2.50 cm   LV e' medial:    8.16 cm/s LV PW:         1.20 cm   LV E/e' medial:  12.7 LV IVS:        1.20 cm   LV e' lateral:   9.46 cm/s LVOT diam:     2.05 cm   LV E/e' lateral: 11.0 LV SV:         99 LV SV Index:   50 LVOT Area:     3.30 cm   RIGHT VENTRICLE RV Basal diam:  3.60 cm RV S prime:     12.00 cm/s TAPSE (M-mode): 2.7 cm RVSP:           33.5 mmHg  LEFT ATRIUM             Index        RIGHT ATRIUM  Index LA diam:        4.40 cm 2.23 cm/m   RA Pressure: 3.00 mmHg LA Vol (A2C):   72.4 ml 36.70 ml/m  RA Area:     17.90 cm LA Vol (A4C):   66.5 ml 33.71 ml/m  RA Volume:   47.80 ml  24.23 ml/m LA Biplane Vol: 71.7 ml 36.34 ml/m AORTIC VALVE LVOT Vmax:   117.00 cm/s LVOT Vmean:  71.600 cm/s LVOT VTI:    0.299 m  AORTA Ao Root diam: 2.90 cm Ao Asc diam:  2.90 cm  MITRAL VALVE                TRICUSPID VALVE MV Area (PHT): 5.16 cm     TR Peak grad:   30.5 mmHg MV Decel Time: 147 msec     TR Vmax:        276.00 cm/s MR Peak grad: 55.1 mmHg     Estimated RAP:  3.00 mmHg MR Mean grad: 32.0 mmHg     RVSP:           33.5 mmHg MR Vmax:      371.00 cm/s MR Vmean:     269.0 cm/s    SHUNTS MV E velocity: 104.00 cm/s  Systemic VTI:  0.30 m MV A velocity: 48.20 cm/s   Systemic Diam: 2.05 cm MV E/A ratio:  2.16  Dalton McleanMD Electronically signed by Wilfred Lacy Signature Date/Time: 11/26/2021/2:09:26 PM    Final          ______________________________________________________________________________________________      Risk Assessment/Calculations:    CHA2DS2-VASc Score = 5  This indicates a  7.2% annual risk of stroke. The patient's score is based upon: CHF History: 0 HTN History: 1 Diabetes History: 0 Stroke History: 0 Vascular Disease History: 1 Age Score: 2 Gender Score: 1      Physical Exam:   VS:  BP 138/62 (BP Location: Right Arm, Patient Position: Sitting, Cuff Size: Large)   Pulse (!) 55   Ht 5\' 4"  (1.626 m)   Wt 201 lb (91.2 kg)   SpO2 99%   BMI 34.50 kg/m    Wt Readings from Last 3 Encounters:  10/06/23 201 lb (91.2 kg)  09/23/23 204 lb (92.5 kg)  08/18/23 196 lb (88.9 kg)    GEN: Well nourished, well developed in no acute distress. Sitting comfortably on the exam table  NECK: No JVD  CARDIAC: RRR, no murmurs, rubs, gallops. Radial pulses 2+ bilaterally  RESPIRATORY:  Clear to auscultation without rales, wheezing or rhonchi. Normal WOB on room air   ABDOMEN: Soft, non-tender, non-distended EXTREMITIES:  No edema in BLE. No deformity   ASSESSMENT AND PLAN: .    Paroxysmal atrial fibrillation  Palpitations  -In the past, patient reported that her PCP told her that she was in atrial fibrillation.  Cardiology disagreed. - EKG at her PCP on 08/18/23 showed atrial fibrillation, HR 90 BPM - Patient is on metoprolol succinate. Reports that as long as she takes her metoprolol, she does not have palpitations. If she misses a dose of metoprolol, she has significant palpitations/fluttering in her chest  - EKG today shows sinus bradycardia with HR 55 BPM  - Ordered 2 week zio to assess afib burden and HR control - Ordered echocardiogram with newly diagnosed afib  - K 4.5 and mag 1.9 in 07/2023. Ordered TSH, free T4  - Continue metoprolol succinate 150 mg daily  - Continue eliquis 2.5 mg BID - denies bleeding on  eliquis. Dose adjusted for creatinine 1.5, age 2 (turning 61 in less than 1 month)   Dyspnea on Exertion  - Patient reports having dyspnea on exertion since an upper respiratory infection in 07/2023. Feels like she has not fully recovered from that  infection and has some shortness of breath on exertion - Denies orthopnea, cough.  - Breathing improves with use of home inhalers  - Low suspicion for cardiac etiology. Echo has been ordered for new onset afib, but will also serve as assessment for DOE   LUQ Pain  - Patient reports occasionally having LUQ abdominal pain when bending over in certain positions. Denies chest pain. Symptoms are not associated with exertion. She suspects that pain may be related to her sliding hiatal hernia. No nausea/vomiting  - Recommended follow up with PCP. EKG today nonischemic. Very low suspicion for cardiac cause as pain is associated with bending over in a specific position.  HTN  - BP overall well controlled. No symptoms of orthostatic hypotension  - Continue losartan 100 mg daily and metoprolol succinate 150 mg daily   OSA  - Followed by pulmonology  - Patient reports sometimes having difficulty tolerating CPAP due to discomfort. Discussed relationship between afib and OSA and encouraged CPAP compliance   Mitral regurgitation  - Mild on echocardiogram in 11/2021 - Ordered echocardiogram as above for new afib   Dispo: Follow up in 2-3 months with APP   Signed, Jonita Albee, PA-C

## 2023-10-06 ENCOUNTER — Ambulatory Visit: Attending: Cardiology

## 2023-10-06 ENCOUNTER — Ambulatory Visit: Payer: Medicare Other | Attending: Cardiology | Admitting: Cardiology

## 2023-10-06 ENCOUNTER — Other Ambulatory Visit: Payer: Self-pay | Admitting: Family Medicine

## 2023-10-06 ENCOUNTER — Encounter: Payer: Self-pay | Admitting: Cardiology

## 2023-10-06 VITALS — BP 138/62 | HR 55 | Ht 64.0 in | Wt 201.0 lb

## 2023-10-06 DIAGNOSIS — J449 Chronic obstructive pulmonary disease, unspecified: Secondary | ICD-10-CM | POA: Diagnosis not present

## 2023-10-06 DIAGNOSIS — R1012 Left upper quadrant pain: Secondary | ICD-10-CM | POA: Diagnosis not present

## 2023-10-06 DIAGNOSIS — I340 Nonrheumatic mitral (valve) insufficiency: Secondary | ICD-10-CM | POA: Insufficient documentation

## 2023-10-06 DIAGNOSIS — I10 Essential (primary) hypertension: Secondary | ICD-10-CM | POA: Diagnosis not present

## 2023-10-06 DIAGNOSIS — I480 Paroxysmal atrial fibrillation: Secondary | ICD-10-CM | POA: Diagnosis not present

## 2023-10-06 DIAGNOSIS — G4733 Obstructive sleep apnea (adult) (pediatric): Secondary | ICD-10-CM | POA: Diagnosis not present

## 2023-10-06 DIAGNOSIS — R002 Palpitations: Secondary | ICD-10-CM | POA: Diagnosis not present

## 2023-10-06 NOTE — Patient Instructions (Signed)
 Medication Instructions:  No changes *If you need a refill on your cardiac medications before your next appointment, please call your pharmacy*  Lab Work: Today we are going to draw a TSH and Free T4 If you have labs (blood work) drawn today and your tests are completely normal, you will receive your results only by: MyChart Message (if you have MyChart) OR A paper copy in the mail If you have any lab test that is abnormal or we need to change your treatment, we will call you to review the results.  Testing/Procedures: Your physician has requested that you have an echocardiogram. Echocardiography is a painless test that uses sound waves to create images of your heart. It provides your doctor with information about the size and shape of your heart and how well your heart's chambers and valves are working. This procedure takes approximately one hour. There are no restrictions for this procedure. Please do NOT wear cologne, perfume, aftershave, or lotions (deodorant is allowed). Please arrive 15 minutes prior to your appointment time.  Please note: We ask at that you not bring children with you during ultrasound (echo/ vascular) testing. Due to room size and safety concerns, children are not allowed in the ultrasound rooms during exams. Our front office staff cannot provide observation of children in our lobby area while testing is being conducted. An adult accompanying a patient to their appointment will only be allowed in the ultrasound room at the discretion of the ultrasound technician under special circumstances. We apologize for any inconvenience.  ZIO XT- Long Term Monitor Instructions  Your physician has requested you wear a ZIO patch monitor for 14 days.  This is a single patch monitor. [employer] supplies one patch monitor per enrollment. Additional stickers are not available. Please do not apply patch if you will be having a Nuclear Stress Test,  Echocardiogram, Cardiac CT, MRI, or Chest  Xray during the period you would be wearing the  monitor. The patch cannot be worn during these tests. You cannot remove and re-apply the  ZIO XT patch monitor.  Your ZIO patch monitor will be mailed 3 day USPS to your address on file. It may take 3-5 days  to receive your monitor after you have been enrolled.  Once you have received your monitor, please review the enclosed instructions. Your monitor  has already been registered assigning a specific monitor serial # to you.  Billing and Patient Assistance Program Information  We have supplied [employer] with any of your insurance information on file for billing purposes. [employer] offers a sliding scale Patient Assistance Program for patients that do not have  insurance, or whose insurance does not completely cover the cost of the ZIO monitor.  You must apply for the Patient Assistance Program to qualify for this discounted rate.  To apply, please call [employer] at [phone], select option 4, select option 2, ask to apply for  Patient Assistance Program. Meredeth Ide will ask your household income, and how many people  are in your household. They will quote your out-of-pocket cost based on that information.  [employer] will also be able to set up a 53-month, interest-free payment plan if needed.  Applying the monitor   Shave hair from upper left chest.  Hold abrader disc by orange tab. Rub abrader in 40 strokes over the upper left chest as  indicated in your monitor instructions.  Clean area with 4 enclosed alcohol pads. Let dry.  Apply patch as indicated in monitor instructions. Patch will be placed  under collarbone on left  side of chest with arrow pointing upward.  Rub patch adhesive wings for 2 minutes. Remove white label marked "1". Remove the white  label marked "2". Rub patch adhesive wings for 2 additional minutes.  While looking in a mirror, press and release button in center of patch. A small green light will  flash 3-4 times. This will  be your only indicator that the monitor has been turned on.  Do not shower for the first 24 hours. You may shower after the first 24 hours.  Press the button if you feel a symptom. You will hear a small click. Record Date, Time and  Symptom in the Patient Logbook.  When you are ready to remove the patch, follow instructions on the last 2 pages of Patient  Logbook. Stick patch monitor onto the last page of Patient Logbook.  Place Patient Logbook in the blue and white box. Use locking tab on box and tape box closed  securely. The blue and white box has prepaid postage on it. Please place it in the mailbox as  soon as possible. Your physician should have your test results approximately 7 days after the  monitor has been mailed back to [facility].  Call [facility] Customer Care at [phone] if you have questions regarding  your ZIO XT patch monitor. Call them immediately if you see an orange light blinking on your  monitor.  If your monitor falls off in less than 4 days, contact our Monitor department at [phone].  If your monitor becomes loose or falls off after 4 days call [employer] at [phone] for  suggestions on securing your monitor  Follow-Up: At [facility], you and your health needs are our priority.  As part of our continuing mission to provide you with exceptional heart care, we have created designated Provider Care Teams.  These Care Teams include your primary Cardiologist (physician) and Advanced Practice Providers (APPs -  Physician Assistants and Nurse Practitioners) who all work together to provide you with the care you need, when you need it.  We recommend signing up for the patient portal called "MyChart".  Sign up information is provided on this After Visit Summary.  MyChart is used to connect with patients for Virtual Visits (Telemedicine).  Patients are able to view lab/test results, encounter notes, upcoming appointments, etc.  Non-urgent messages  can be sent to your provider as well.   To learn more about what you can do with MyChart, go to [email or web address].    Your next appointment:   2-3 month(s)  Provider:   Robet Leu, PA-C  Other Instructions

## 2023-10-06 NOTE — Progress Notes (Unsigned)
 Enrolled for [employer] to mail a ZIO XT long term holter monitor to the patients address on file.   Dr. Jens Som to read.

## 2023-10-07 LAB — T4, FREE: Free T4: 1.36 ng/dL (ref 0.82–1.77)

## 2023-10-07 LAB — TSH: TSH: 3.23 u[IU]/mL (ref 0.450–4.500)

## 2023-10-09 ENCOUNTER — Telehealth: Payer: Self-pay

## 2023-10-09 ENCOUNTER — Other Ambulatory Visit: Payer: Self-pay | Admitting: Family Medicine

## 2023-10-09 DIAGNOSIS — I10 Essential (primary) hypertension: Secondary | ICD-10-CM

## 2023-10-09 MED ORDER — METOPROLOL SUCCINATE ER 100 MG PO TB24: 150.0000 mg | ORAL_TABLET | Freq: Every day | ORAL | 1 refills | Status: AC

## 2023-10-09 NOTE — Telephone Encounter (Signed)
 Copied from CRM [id number]. Topic: Clinical - Prescription Issue >> Oct 09, 2023  3:28 PM Nila Nephew wrote: Reason for CRM: Patient is calling in for clarity regarding metoprolol succinate (TOPROL-XL) 100 MG 24 hr tablet. Patient states dosage was moved to 1.5 tablets daily in January but that this refill has reverted back to 1 tablet daily. Patient and pharmacy seeking clarity on correct dosing for patient. Please advise.

## 2023-10-09 NOTE — Telephone Encounter (Signed)
 Please clarify the current dose for patient. Per patient metoprolol was increased to 1.5 tabs. Recent med refill was for 1 tab daily. Thanks in advance.

## 2023-10-10 ENCOUNTER — Telehealth: Payer: Self-pay

## 2023-10-10 NOTE — Telephone Encounter (Signed)
 Left message to call back

## 2023-10-10 NOTE — Telephone Encounter (Signed)
-----   Message from Jonita Albee sent at 10/07/2023  7:59 AM EDT ----- Please tell patient that her lab work from yesterday showed normal thyroid function. This tells [place] that her thyroid is not contributing to her afib   Thanks KJ

## 2023-10-14 NOTE — Telephone Encounter (Signed)
 Left message to call back

## 2023-10-15 NOTE — Telephone Encounter (Signed)
 Attempted to contact the patient. No answer. Left a detailed message regarding the metoprolol rx has been changed from 1 to 1.5 tablets daily. Direct call back info provided.

## 2023-10-16 NOTE — Telephone Encounter (Signed)
 Pt returned call, made aware of results, voiced understanding//ah/rma

## 2023-10-17 NOTE — Telephone Encounter (Signed)
 Attempted to contact the patient. No answer. Left a detailed message regarding the metoprolol rx has been changed from 1 to 1.5 tablets daily. Direct call back info provided.

## 2023-10-23 NOTE — Telephone Encounter (Signed)
 Attempted to contact the patient. No answer. Left a detailed message regarding the metoprolol rx has been changed from 1 to 1.5 tablets daily. Direct call back info provided.

## 2023-10-28 NOTE — Telephone Encounter (Signed)
 Patient has not been seen in a year with no follow up made will wait till patient calls and asks for this before running

## 2023-10-31 ENCOUNTER — Telehealth: Payer: Self-pay

## 2023-10-31 ENCOUNTER — Other Ambulatory Visit: Payer: Self-pay | Admitting: Family Medicine

## 2023-10-31 MED ORDER — APIXABAN 2.5 MG PO TABS: 2.5000 mg | ORAL_TABLET | Freq: Two times a day (BID) | ORAL | 1 refills | Status: DC

## 2023-10-31 NOTE — Telephone Encounter (Signed)
 I spoke with patient and advised her that the prescription for the 1.5 tablets was sent to the pharmacy in March. She will pick up the prescription later.

## 2023-10-31 NOTE — Telephone Encounter (Signed)
 Copied from CRM [id number]. Topic: Clinical - Medication Question >> Oct 31, 2023  9:17 AM Everette Rank wrote: Reason for CRM: metoprolol succinate (TOPROL-XL) 100 MG 24 hr tablet-Patient is requesting to get some extra due to she is only taking 1 pill but she will like to take 1 1/12. Ned call back on clarification if ok.  [phone]

## 2023-11-04 ENCOUNTER — Ambulatory Visit
Admission: RE | Admit: 2023-11-04 | Discharge: 2023-11-04 | Disposition: A | Discharge status: Home / Self Care | Source: Ambulatory Visit | Attending: Family Medicine | Admitting: Family Medicine

## 2023-11-04 DIAGNOSIS — Z1231 Encounter for screening mammogram for malignant neoplasm of breast: Secondary | ICD-10-CM

## 2023-11-05 ENCOUNTER — Ambulatory Visit (HOSPITAL_COMMUNITY): Attending: Cardiology

## 2023-11-05 DIAGNOSIS — I480 Paroxysmal atrial fibrillation: Secondary | ICD-10-CM | POA: Diagnosis not present

## 2023-11-05 LAB — ECHOCARDIOGRAM COMPLETE
Area-P 1/2: 4.15 cm2
MV M vel: 4.27 m/s
MV Peak grad: 72.9 mmHg
S' Lateral: 2.9 cm

## 2023-11-10 DIAGNOSIS — I480 Paroxysmal atrial fibrillation: Secondary | ICD-10-CM | POA: Diagnosis not present

## 2023-11-11 DIAGNOSIS — I480 Paroxysmal atrial fibrillation: Secondary | ICD-10-CM

## 2023-11-17 ENCOUNTER — Other Ambulatory Visit: Payer: Self-pay | Admitting: Family Medicine

## 2023-11-17 NOTE — Progress Notes (Signed)
 Cardiology Office Note:  .   Date:  11/28/2023  ID:  Kimberly Jackson, DOB 1945-08-06, MRN [id number] PCP: Adela Holter, DO   HeartCare Providers Cardiologist:  Alexandria Angel, MD   History of Present Illness: Kimberly Jackson   Kimberly Jackson is a 78 y.o. female  with a past medical history of atrial fibrillation, aortic atherosclerosis on CT chest in 2020, palpitations, hypertension, mitral valve regurgitation.  She is followed by Dr. Audery Blazing, presents today for a follow up appointment    Patient establish care with Dr. Audery Blazing in 09/2020 for evaluation of possible atrial fibrillation.  Patient reportedly had been told by her primary care provider in [place] that she had paroxysmal atrial fibrillation in the past.  However, a cardiologist disagreed.  There were no records to support a diagnosis for atrial fibrillation.  She remained on beta-blocker. She had echocardiogram completed in 10/2020 that showed EF 55-60%, no regional wall motion abnormalities, normal RV function, moderate mitral valve regurgitation.  Later, echocardiogram 11/2021 showed EF 55-60% with no wall motion abnormalities, grade 2 diastolic dysfunction, mild MR.   Patient was last seen by Dr. Audery Blazing in 04/2022.  At that time, patient had dyspnea with more extreme activities, not with routine activities.  Denied chest pain, orthopnea, PND, pedal edema.  She remained on beta-blocker. She was seen by her PCP in 07/2023 and EKG showed atrial fibrillation. She was started on eliquis    I saw patient in clinic on 10/06/23. At that time, patient reported some palpitations if she missed a dose of metoprolol . If she took her metoprolol  as prescribed, she did not have any palpitations. She had some shortness of breath on exertion, which had been going on since she had an upper respiratory infection in 07/2023. She remained on metoprolol  and eliquis . She underwent echocardiogram on 11/05/23 that showed EF 60-65%, no regional wall motion  abnormalities, mild LVH, grade II DD, normal RV systolic function, moderate left atrial dilation and mild-moderate right atrial dilatation, moderate MR, mild AI. She wore a cardiac monitor that showed sinus rhythm, occasional PACs, short runs of SVT (longest 16 beats).   Today, patient presents for a follow-up appointment.  Reports that she has been a bit short of breath on exertion for the past 2 weeks or so.  Denies cough, orthopnea, lower extremity swelling.  She has not gained weight since last being in the clinic per review of her vital signs.  She has been having some seasonal allergies.  Denies recent viral illnesses.  While walking into clinic today, she also felt like she "could not walk straight".  She reports feeling like the room was spinning around her.  She denies any weakness, syncope, near syncope.  Denies vision changes.  Denies chest pain, palpitations.  She has been compliant with her Eliquis , denies bleeding   Studies Reviewed: .   Cardiac Studies & Procedures   ______________________________________________________________________________________________     ECHOCARDIOGRAM  ECHOCARDIOGRAM COMPLETE 11/05/2023  Narrative ECHOCARDIOGRAM REPORT    Patient Name:   Kimberly Jackson Date of Exam: 11/05/2023 Medical Rec #:  [id number]       Height:       64.0 in Accession #:    [id number]      Weight:       201.0 lb Date of Birth:  1945-11-19        BSA:          1.961 m Patient Age:    41 years  BP:           138/62 mmHg Patient Gender: F               HR:           58 bpm. Exam Location:  [address]  Procedure: 2D Echo, Color Doppler, Cardiac Doppler and 3D Echo (Both Spectral and Color Flow Doppler were utilized during procedure).  Indications:    Atrial Fibrillation I48.91  History:        Patient has prior history of Echocardiogram examinations, most recent 11/26/2021. COPD; Risk Factors:Hypertension.  Sonographer:    Joleen Navy RDCS Referring Phys:  [id number] Kimberly Jackson  IMPRESSIONS   1. Left ventricular ejection fraction, by estimation, is 60 to 65%. Left ventricular ejection fraction by 3D volume is 65 %. The left ventricle has normal function. The left ventricle has no regional wall motion abnormalities. There is mild concentric left ventricular hypertrophy. Left ventricular diastolic parameters are consistent with Grade II diastolic dysfunction (pseudonormalization). 2. Right ventricular systolic function is normal. The right ventricular size is mildly enlarged. There is mildly elevated pulmonary artery systolic pressure. The estimated right ventricular systolic pressure is 42.7 mmHg. 3. Left atrial size was moderately dilated. 4. Right atrial size was mild to moderately dilated. 5. The mitral valve is normal in structure. Moderate mitral valve regurgitation. No evidence of mitral stenosis. 6. Tricuspid valve regurgitation is mild to moderate. 7. The aortic valve is tricuspid. There is mild calcification of the aortic valve. Aortic valve regurgitation is mild. No aortic stenosis is present. 8. The inferior vena cava is normal in size with greater than 50% respiratory variability, suggesting right atrial pressure of 3 mmHg.  FINDINGS Left Ventricle: Left ventricular ejection fraction, by estimation, is 60 to 65%. Left ventricular ejection fraction by 3D volume is 65 %. The left ventricle has normal function. The left ventricle has no regional wall motion abnormalities. The left ventricular internal cavity size was normal in size. There is mild concentric left ventricular hypertrophy. Left ventricular diastolic parameters are consistent with Grade II diastolic dysfunction (pseudonormalization).  Right Ventricle: The right ventricular size is mildly enlarged. No increase in right ventricular wall thickness. Right ventricular systolic function is normal. There is mildly elevated pulmonary artery systolic pressure. The tricuspid  regurgitant velocity is 3.15 m/s, and with an assumed right atrial pressure of 3 mmHg, the estimated right ventricular systolic pressure is 42.7 mmHg.  Left Atrium: Left atrial size was moderately dilated.  Right Atrium: Right atrial size was mild to moderately dilated.  Pericardium: There is no evidence of pericardial effusion.  Mitral Valve: The mitral valve is normal in structure. Mild mitral annular calcification. Moderate mitral valve regurgitation. No evidence of mitral valve stenosis.  Tricuspid Valve: The tricuspid valve is normal in structure. Tricuspid valve regurgitation is mild to moderate. No evidence of tricuspid stenosis.  Aortic Valve: The aortic valve is tricuspid. There is mild calcification of the aortic valve. Aortic valve regurgitation is mild. No aortic stenosis is present.  Pulmonic Valve: The pulmonic valve was normal in structure. Pulmonic valve regurgitation is trivial. No evidence of pulmonic stenosis.  Aorta: The aortic root is normal in size and structure.  Venous: The inferior vena cava is normal in size with greater than 50% respiratory variability, suggesting right atrial pressure of 3 mmHg.  IAS/Shunts: No atrial level shunt detected by color flow Doppler.  Additional Comments: 3D was performed not requiring image post processing on an independent workstation and was  normal.   LEFT VENTRICLE PLAX 2D LVIDd:         4.30 cm         Diastology LVIDs:         2.90 cm         LV e' medial:    5.51 cm/s LV PW:         1.10 cm         LV E/e' medial:  18.1 LV IVS:        1.20 cm         LV e' lateral:   7.46 cm/s LVOT diam:     2.30 cm         LV E/e' lateral: 13.4 LVOT Area:     4.15 cm  3D Volume EF LV 3D EF:    Left ventricul ar ejection fraction by 3D volume is 65 %.  3D Volume EF: 3D EF:        65 % LV EDV:       102 ml LV ESV:       36 ml LV SV:        66 ml  RIGHT VENTRICLE             IVC RV Basal diam:  4.10 cm     IVC diam: 1.70  cm RV Mid diam:    3.70 cm RV S prime:     13.10 cm/s TAPSE (M-mode): 2.2 cm  LEFT ATRIUM             Index        RIGHT ATRIUM           Index LA diam:        3.20 cm 1.63 cm/m   RA Area:     15.90 cm LA Vol (A2C):   60.7 ml 30.96 ml/m  RA Volume:   42.10 ml  21.47 ml/m LA Vol (A4C):   64.5 ml 32.90 ml/m LA Biplane Vol: 69.2 ml 35.30 ml/m  AORTA Ao Root diam: 2.80 cm Ao Asc diam:  3.40 cm  MITRAL VALVE               TRICUSPID VALVE MV Area (PHT): 4.15 cm    TR Peak grad:   39.7 mmHg MV Decel Time: 183 msec    TR Vmax:        315.00 cm/s MR Peak grad: 72.9 mmHg MR Mean grad: 54.5 mmHg    SHUNTS MR Vmax:      427.00 cm/s  Systemic Diam: 2.30 cm MR Vmean:     351.5 cm/s MV E velocity: 99.70 cm/s MV A velocity: 39.90 cm/s MV E/A ratio:  2.50  Jules Oar MD Electronically signed by Jules Oar MD Signature Date/Time: 11/05/2023/3:35:42 PM    Final    MONITORS  LONG TERM MONITOR (3-14 DAYS) 11/11/2023  Narrative Patch Wear Time:  13 days and 17 hours (2025-03-27T14:29:27-0400 to 2025-04-10T08:02:15-0400)  Patient had a min HR of 46 bpm, max HR of 174 bpm, and avg HR of 65 bpm. Predominant underlying rhythm was Sinus Rhythm. 71 Supraventricular Tachycardia runs occurred, the run with the fastest interval lasting 5 beats with a max rate of 174 bpm, the longest lasting 16 beats with an avg rate of 90 bpm. Isolated SVEs were rare (<1.0%), SVE Couplets were rare (<1.0%), and SVE Triplets were rare (<1.0%). Isolated VEs were rare (<1.0%), VE Couplets were rare (<1.0%), and no VE Triplets were present. Ventricular Bigeminy and  Trigeminy were present.  Sinus bradycardia, normal sinus rhythm, sinus tachycardia, occasional PAC, short runs of SVT (longest 16 beats), rare PVC and couplet. Alexandria Angel       ______________________________________________________________________________________________      Risk Assessment/Calculations:    CHA2DS2-VASc Score =  5   This indicates a 7.2% annual risk of stroke. The patient's score is based upon: CHF History: 0 HTN History: 1 Diabetes History: 0 Stroke History: 0 Vascular Disease History: 1 Age Score: 2 Gender Score: 1    Physical Exam:   VS:  BP 132/72 (BP Location: Left Arm, Patient Position: Sitting, Cuff Size: Normal)   Pulse (!) 59   Ht 5\' 4"  (1.626 m)   Wt 200 lb (90.7 kg)   SpO2 98%   BMI 34.33 kg/m    Wt Readings from Last 3 Encounters:  11/28/23 200 lb (90.7 kg)  10/06/23 201 lb (91.2 kg)  09/23/23 204 lb (92.5 kg)    GEN: Well nourished, well developed in no acute distress.  Sitting comfortably in the chair NECK: No JVD CARDIAC: RRR, no murmurs, rubs, gallops.  Radial pulses 2+ bilaterally RESPIRATORY:  Clear to auscultation without rales, wheezing or rhonchi.  Normal work of breathing on room air ABDOMEN: Soft, non-tender, non-distended EXTREMITIES:  No edema in bilateral lower extremities; No deformity.  Bilateral upper extremities equal in strength.  Bilateral lower extremities equal in strength  ASSESSMENT AND PLAN: .    Paroxysmal atrial fibrillation  - EKG at her PCP on 08/18/23 showed atrial fibrillation, HR 90 BPM - 2 week zio in 10/2023 showed no atrial fibrillation  - Echocardiogram from 10/2023 showed EF60-65%, no regional wall motion abnormalities, grade II DD, normal RV systolic function, moderate LA dilation, mild-moderate RA dilation  - K 4.5 and mag 1.9 in 07/2023. TSH within normal limits in 09/2023  - Patient denies palpitations or noticeable fluctuations in heart rate at home - Continue metoprolol  succinate 150 mg daily  - Continue eliquis  2.5 mg BID - denies bleeding on eliquis . Dose adjusted for creatinine 1.5, age 66   Shortness of breath - Patient reports having some mild shortness of breath on exertion for the past 2 weeks.  Cough, lower extremity swelling, orthopnea.  Has been affected by seasonal allergies but denies recent viral illness. - Patient is  euvolemic on exam.  She is on triamterene -hydrochlorothiazide 37.5-25 mg daily.  Admits that she does not drink much water throughout the day.  If she is drinking something it is usually soda.  She also has chronic lower extremity cramps that can be very severe - With the patient's of dizziness today, her low water intake, and her persistent lower extremity cramping, I am hesitant to increase her diuretic.  Further, I am not convinced that her shortness of breath is due to hypervolemia - Continue current dose of triamterene  hydrochlorothiazide for now.  Ordered BNP, if significantly elevated may adjust diuretic dosing. Also ordered BMP  - Encouraged patient to increase physical activity as tolerated.  She admits that she is not very active in her daily life. Also instructed her to be compliant with CPAP at night   Dizziness - Patient reports having an episode of dizziness while walking to clinic today.  Denies recent episodes of syncope/near syncope prior to this - BP 132/72 - I encouraged her to increase water intake as above.  Also encouraged her to make position changes slowly - Ordered BMP, CBC  HTN  - BP overall well controlled. No symptoms of  orthostatic hypotension  - Continue losartan  100 mg daily and metoprolol  succinate 150 mg daily    OSA  COPD - Followed by pulmonology and PCP   - Patient reports sometimes having difficulty tolerating CPAP due to discomfort. Discussed relationship between afib and OSA and encouraged CPAP compliance   Mitral regurgitation  Aortic regurgitation  - Echocardiogram from 11/05/23 showed moderate MR, mild AI  - She is euvolemic on exam today. Anticipate repeat echo in 1 year  - Continue triamtrene-hydrochlorothiazide 37.5-25 mg daily   Dispo: Follow-up in 6 months with Dr. Audery Blazing  Signed, Kimberly Fang, PA-C

## 2023-11-21 ENCOUNTER — Telehealth: Payer: Self-pay

## 2023-11-21 NOTE — Telephone Encounter (Signed)
-----   Message from Debria Fang sent at 11/06/2023  3:33 PM EDT ----- Please tell patient that their echocardiogram showed EF (pumping function of the heart) was normal at 60-65%. There were no regional wall motion abnormalities. Heart walls are mildly thick and do not relax fully. RV function is normal   Both atria (top chambers of her heart) are moderately dilated. This increases her risk of going into afib. We will await results of her cardiac monitor to see how often she is having afib and will adjust meds based on those results.   She also has moderate mitral valve regurgitation (leakiness). This was previously mild in 2023. Tricuspid valve is mild-moderate leaky. These valvular abnormalities are something that we will need to monitor in the future with repeat echos. Having leaky valves can also make her more prone to holding onto fluid- continue triamterene  hydrochlorothiazide for volume management   Thanks KJ

## 2023-11-21 NOTE — Telephone Encounter (Signed)
Left message to call back on monday

## 2023-11-21 NOTE — Telephone Encounter (Signed)
-----   Message from Debria Fang sent at 11/14/2023  3:07 PM EDT ----- Please tell patient that her recent cardiac monitor showed predominantly normal sinus rhythm with average HR of 65 BPM. There were short runs of SVT (fast HR from the top of the heart). Runs of SVT were brief with the longest run lasting 16 beats. Continue current medications and follow up as arranged   Thanks KJ

## 2023-11-24 NOTE — Telephone Encounter (Signed)
 Patient returned staff call and stated she will be available after 3:00 pm.

## 2023-11-25 NOTE — Telephone Encounter (Signed)
 Called patient yesterday with results advised of below patient verbalized understanding.

## 2023-11-28 ENCOUNTER — Encounter: Payer: Self-pay | Admitting: Cardiology

## 2023-11-28 ENCOUNTER — Ambulatory Visit: Attending: Cardiology | Admitting: Cardiology

## 2023-11-28 VITALS — BP 132/72 | HR 59 | Ht 64.0 in | Wt 200.0 lb

## 2023-11-28 DIAGNOSIS — G4733 Obstructive sleep apnea (adult) (pediatric): Secondary | ICD-10-CM | POA: Diagnosis not present

## 2023-11-28 DIAGNOSIS — I340 Nonrheumatic mitral (valve) insufficiency: Secondary | ICD-10-CM | POA: Insufficient documentation

## 2023-11-28 DIAGNOSIS — I10 Essential (primary) hypertension: Secondary | ICD-10-CM | POA: Insufficient documentation

## 2023-11-28 DIAGNOSIS — R42 Dizziness and giddiness: Secondary | ICD-10-CM | POA: Diagnosis not present

## 2023-11-28 DIAGNOSIS — I480 Paroxysmal atrial fibrillation: Secondary | ICD-10-CM | POA: Insufficient documentation

## 2023-11-28 DIAGNOSIS — R002 Palpitations: Secondary | ICD-10-CM | POA: Diagnosis not present

## 2023-11-28 DIAGNOSIS — Z79899 Other long term (current) drug therapy: Secondary | ICD-10-CM | POA: Diagnosis not present

## 2023-11-28 DIAGNOSIS — R0602 Shortness of breath: Secondary | ICD-10-CM | POA: Insufficient documentation

## 2023-11-28 DIAGNOSIS — J449 Chronic obstructive pulmonary disease, unspecified: Secondary | ICD-10-CM | POA: Insufficient documentation

## 2023-11-28 LAB — CBC

## 2023-11-28 NOTE — Patient Instructions (Signed)
 Medication Instructions:  No changes *If you need a refill on your cardiac medications before your next appointment, please call your pharmacy*  Lab Work: Today we are going to draw a Bmet, CBC, BNP, and Mag If you have labs (blood work) drawn today and your tests are completely normal, you will receive your results only by: MyChart Message (if you have MyChart) OR A paper copy in the mail If you have any lab test that is abnormal or we need to change your treatment, we will call you to review the results.  Testing/Procedures: No testing  Follow-Up: At [facility], you and your health needs are our priority.  As part of our continuing mission to provide you with exceptional heart care, our providers are all part of one team.  This team includes your primary Cardiologist (physician) and Advanced Practice Providers or APPs (Physician Assistants and Nurse Practitioners) who all work together to provide you with the care you need, when you need it.  Your next appointment:   6 month(s)  Provider:   Alexandria Angel, MD in [place]    We recommend signing up for the patient portal called "MyChart".  Sign up information is provided on this After Visit Summary.  MyChart is used to connect with patients for Virtual Visits (Telemedicine).  Patients are able to view lab/test results, encounter notes, upcoming appointments, etc.  Non-urgent messages can be sent to your provider as well.   To learn more about what you can do with MyChart, go to [email or web address].

## 2023-11-29 LAB — BASIC METABOLIC PANEL WITH GFR
BUN/Creatinine Ratio: 24 (ref 12–28)
BUN: 29 mg/dL — ABNORMAL HIGH (ref 8–27)
CO2: 20 mmol/L (ref 20–29)
Calcium: 9 mg/dL (ref 8.7–10.3)
Chloride: 102 mmol/L (ref 96–106)
Creatinine, Ser: 1.23 mg/dL — ABNORMAL HIGH (ref 0.57–1.00)
Glucose: 94 mg/dL (ref 70–99)
Potassium: 4.7 mmol/L (ref 3.5–5.2)
Sodium: 138 mmol/L (ref 134–144)
eGFR: 44 mL/min/1.73 — ABNORMAL LOW

## 2023-11-29 LAB — BRAIN NATRIURETIC PEPTIDE: BNP: 161.3 pg/mL — ABNORMAL HIGH (ref 0.0–100.0)

## 2023-11-29 LAB — CBC
Hematocrit: 36.8 % (ref 34.0–46.6)
Hemoglobin: 12.7 g/dL (ref 11.1–15.9)
MCH: 33 pg (ref 26.6–33.0)
MCHC: 34.5 g/dL (ref 31.5–35.7)
MCV: 96 fL (ref 79–97)
Platelets: 298 x10E3/uL (ref 150–450)
RBC: 3.85 x10E6/uL (ref 3.77–5.28)
RDW: 12.9 % (ref 11.7–15.4)
WBC: 6.5 x10E3/uL (ref 3.4–10.8)

## 2023-11-29 LAB — MAGNESIUM: Magnesium: 1.6 mg/dL (ref 1.6–2.3)

## 2023-12-02 ENCOUNTER — Ambulatory Visit: Payer: Self-pay | Admitting: Cardiology

## 2023-12-02 NOTE — Telephone Encounter (Signed)
 Left message to call back

## 2023-12-02 NOTE — Telephone Encounter (Signed)
-----   Message from Debria Fang sent at 12/02/2023 11:17 AM EDT ----- Please tell patient that her lab work showed stable kidney function, normal electrolytes. CBC normal with normal WBC, hemoglobin, platelets. BNP (maker of extra fluid in the system) is mildly elevated. As we discussed at her appointment, with her recent dizziness, leg cramps, and low fluid intake, I do not think we have to increase her fluid pill. She should continue her current dose of Maxzide, decrease sodium intake, and increase physical activity as tolerated   Thanks KJ

## 2023-12-05 NOTE — Telephone Encounter (Signed)
Pt returning call in regards to results.  ?

## 2024-01-05 ENCOUNTER — Other Ambulatory Visit: Payer: Self-pay

## 2024-01-05 MED ORDER — OMEPRAZOLE 40 MG PO CPDR
40.0000 mg | DELAYED_RELEASE_CAPSULE | Freq: Every day | ORAL | 3 refills | Status: AC
Start: 2024-01-05 — End: ?

## 2024-01-18 ENCOUNTER — Other Ambulatory Visit: Payer: Self-pay | Admitting: Family Medicine

## 2024-02-10 ENCOUNTER — Other Ambulatory Visit: Payer: Self-pay

## 2024-02-10 DIAGNOSIS — I10 Essential (primary) hypertension: Secondary | ICD-10-CM

## 2024-02-10 MED ORDER — TRIAMTERENE-HCTZ 37.5-25 MG PO TABS: 1.0000 | ORAL_TABLET | Freq: Every day | ORAL | 1 refills | Status: AC

## 2024-02-16 ENCOUNTER — Encounter: Payer: Self-pay | Admitting: Family Medicine

## 2024-02-16 ENCOUNTER — Ambulatory Visit (INDEPENDENT_AMBULATORY_CARE_PROVIDER_SITE_OTHER): Payer: Medicare Other | Admitting: Family Medicine

## 2024-02-16 VITALS — BP 129/74 | HR 53 | Resp 20 | Ht 64.0 in | Wt 201.0 lb

## 2024-02-16 DIAGNOSIS — J449 Chronic obstructive pulmonary disease, unspecified: Secondary | ICD-10-CM | POA: Diagnosis not present

## 2024-02-16 DIAGNOSIS — I480 Paroxysmal atrial fibrillation: Secondary | ICD-10-CM

## 2024-02-16 DIAGNOSIS — I10 Essential (primary) hypertension: Secondary | ICD-10-CM | POA: Diagnosis not present

## 2024-02-16 NOTE — Assessment & Plan Note (Signed)
 Discussed dietary changes.  Would not recommend OTC weight loss medications due to unknown ingredients or ingredients that may worsen A. Fib.

## 2024-02-16 NOTE — Progress Notes (Signed)
 Kimberly Jackson - 78 y.o. female MRN [id number]  Date of birth: 08/03/1945  Subjective Chief Complaint  Patient presents with   Medical Management of Chronic Issues    Primary hypertension    HPI Kimberly Jackson is a 78 y.o. female here today for follow up visit.   She reports that she is doing pretty well.  She was having some difficulty with dyspnea a few months ago but this is now resolved.   She is concerned about her weight.  Asking if she can use OTC medications to help with weight mgmt.   She has history of A. Fib and HTN.  Remains on toprol -XL, maxzide and losartan .  Rate is controlled.  BP is well controlled at this time.  She denies chest pain, shortness of breath, palpitations, headache or vision changes.  She remains anticoagulated with Eliquis .   ROS:  A comprehensive ROS was completed and negative except as noted per HPI  Allergies  Allergen Reactions   Corticosteroids Other (See Comments)    Suicidal   Prochlorperazine Other (See Comments)    Acute dystonic reaction type   Cortisone    Egg-Derived Products Nausea And Vomiting   Nickel Rash    skin   Penicillins Rash    Past Medical History:  Diagnosis Date   Alcohol abuse    COPD (chronic obstructive pulmonary disease) (HCC)    Depression    Hypertension    OSA (obstructive sleep apnea)    Reflux     Past Surgical History:  Procedure Laterality Date   ABDOMINAL HYSTERECTOMY     APPENDECTOMY     78 yo   BLADDER SURGERY  2015   CHOLECYSTECTOMY     REPLACEMENT TOTAL KNEE     ROTATOR CUFF REPAIR     TONSILLECTOMY      Social History   Socioeconomic History   Marital status: Married    Spouse name: Not on file   Number of children: 3   Years of education: Not on file   Highest education level: Not on file  Occupational History   Not on file  Tobacco Use   Smoking status: Never   Smokeless tobacco: Never  Vaping Use   Vaping status: Never Used  Substance and Sexual Activity   Alcohol use:  No    Alcohol/week: 0.0 standard drinks of alcohol   Drug use: No   Sexual activity: Not on file  Other Topics Concern   Not on file  Social History Narrative   [place] lives with her husband. She has 3 children. She loves painting.    Social Drivers of Corporate investment banker Strain: Low Risk  (09/23/2023)   Overall Financial Resource Strain (CARDIA)    Difficulty of Paying Living Expenses: Not hard at all  Food Insecurity: No Food Insecurity (09/23/2023)   Hunger Vital Sign    Worried About Running Out of Food in the Last Year: Never true    Ran Out of Food in the Last Year: Never true  Transportation Needs: No Transportation Needs (09/23/2023)   PRAPARE - Administrator, Civil Service (Medical): No    Lack of Transportation (Non-Medical): No  Physical Activity: Sufficiently Active (09/23/2023)   Exercise Vital Sign    Days of Exercise per Week: 5 days    Minutes of Exercise per Session: 60 min  Stress: No Stress Concern Present (09/23/2023)   [employer] of Occupational Health - Occupational Stress Questionnaire  Feeling of Stress : Not at all  Social Connections: Socially Integrated (09/23/2023)   Social Connection and Isolation Panel    Frequency of Communication with Friends and Family: More than three times a week    Frequency of Social Gatherings with Friends and Family: More than three times a week    Attends Religious Services: More than 4 times per year    Active Member of Clubs or Organizations: Yes    Attends Engineer, structural: More than 4 times per year    Marital Status: Married    Family History  Problem Relation Age of Onset   Alcohol abuse Mother    Atrial fibrillation Father    Breast cancer Sister 29   Breast cancer Maternal Aunt    Breast cancer Paternal Aunt     Health Maintenance  Topic Date Due   Zoster Vaccines- Shingrix (1 of 2) 10/23/1962   COVID-19 Vaccine (3 - Pfizer risk series) 11/23/2019   Colonoscopy  09/16/2022    INFLUENZA VACCINE  02/20/2024   Medicare Annual Wellness (AWV)  09/22/2024   DTaP/Tdap/Td (2 - Td or Tdap) 11/19/2024   MAMMOGRAM  11/03/2025   Pneumococcal Vaccine: 50+ Years  Completed   DEXA SCAN  Completed   Hepatitis B Vaccines  Aged Out   HPV VACCINES  Aged Out   Meningococcal B Vaccine  Aged Out   Hepatitis C Screening  Discontinued     ----------------------------------------------------------------------------------------------------------------------------------------------------------------------------------------------------------------- Physical Exam BP 129/74   Pulse (!) 53   Resp 20   Ht 5' 4 (1.626 m)   Wt 201 lb (91.2 kg)   SpO2 99%   BMI 34.50 kg/m   Physical Exam Constitutional:      Appearance: Normal appearance.  Eyes:     General: No scleral icterus. Cardiovascular:     Rate and Rhythm: Regular rhythm.  Pulmonary:     Effort: Pulmonary effort is normal.     Breath sounds: Normal breath sounds.  Musculoskeletal:     Cervical back: Neck supple.  Neurological:     Mental Status: She is alert.  Psychiatric:        Mood and Affect: Mood normal.        Behavior: Behavior normal.     ------------------------------------------------------------------------------------------------------------------------------------------------------------------------------------------------------------------- Assessment and Plan  HTN (hypertension) BP is well controlled.  Continue current medications.   COPD (chronic obstructive pulmonary disease) (HCC) Doing well at this time. Continue current inhalers.   Paroxysmal a-fib (HCC) Stable at this time.  Remains rate controlled and anticoagulated.  Follows with cardiology.   Morbid obesity (HCC) Discussed dietary changes.  Would not recommend OTC weight loss medications due to unknown ingredients or ingredients that may worsen A. Fib.    No orders of the defined types were placed in this encounter.   Return  in about 6 months (around 08/18/2024) for Hypertension.

## 2024-02-16 NOTE — Patient Instructions (Signed)
 Try over the counter astepro nasal spray.

## 2024-02-16 NOTE — Assessment & Plan Note (Signed)
 Stable at this time.  Remains rate controlled and anticoagulated.  Follows with cardiology.

## 2024-02-16 NOTE — Assessment & Plan Note (Signed)
 Doing well at this time. Continue current inhalers.

## 2024-02-16 NOTE — Assessment & Plan Note (Signed)
 BP is well controlled Continue current medications

## 2024-03-18 ENCOUNTER — Other Ambulatory Visit: Payer: Self-pay | Admitting: Family Medicine

## 2024-05-27 ENCOUNTER — Other Ambulatory Visit: Payer: Self-pay | Admitting: Family Medicine

## 2024-06-23 ENCOUNTER — Ambulatory Visit: Payer: Self-pay

## 2024-06-23 ENCOUNTER — Encounter: Payer: Self-pay | Admitting: Family Medicine

## 2024-06-23 ENCOUNTER — Ambulatory Visit: Admitting: Family Medicine

## 2024-06-23 VITALS — BP 109/67 | HR 91 | Temp 98.2°F | Ht 64.0 in | Wt 204.0 lb

## 2024-06-23 DIAGNOSIS — J40 Bronchitis, not specified as acute or chronic: Secondary | ICD-10-CM

## 2024-06-23 DIAGNOSIS — G4733 Obstructive sleep apnea (adult) (pediatric): Secondary | ICD-10-CM | POA: Diagnosis not present

## 2024-06-23 DIAGNOSIS — J329 Chronic sinusitis, unspecified: Secondary | ICD-10-CM | POA: Diagnosis not present

## 2024-06-23 DIAGNOSIS — J449 Chronic obstructive pulmonary disease, unspecified: Secondary | ICD-10-CM | POA: Diagnosis not present

## 2024-06-23 MED ORDER — DOXYCYCLINE HYCLATE 100 MG PO TABS: 100.0000 mg | ORAL_TABLET | Freq: Two times a day (BID) | ORAL | 0 refills | Status: DC

## 2024-06-23 MED ORDER — ZEPBOUND 2.5 MG/0.5ML ~~LOC~~ SOAJ: 2.5000 mg | SUBCUTANEOUS | 0 refills | Status: DC

## 2024-06-23 MED ORDER — ZEPBOUND 5 MG/0.5ML ~~LOC~~ SOAJ: 5.0000 mg | SUBCUTANEOUS | 0 refills | Status: DC

## 2024-06-23 NOTE — Assessment & Plan Note (Signed)
 Treating with doxycycline .  Recommend continued supportive care with increased fluids and OTC meds for symptoms.  Red flags reviewed.

## 2024-06-23 NOTE — Progress Notes (Signed)
 Kimberly Jackson - 78 y.o. female MRN [id number]  Date of birth: [birth year]  Subjective Chief Complaint  Patient presents with   Nasal Congestion   Cough    HPI Kimberly Jackson is a 78 y.o. female here today with complaint of sinus congestion, fever, chills, cough and wheezing.  History of COPD.  She is using inhalers as directed.  Cough has been productive.  Denies body aches or GI symptoms.  She has tried coricidin with some relief.   She is also interested in adding Zepbound.  History of OSA with intolerance to CPAP in the past along with morbid obesity.    ROS:  A comprehensive ROS was completed and negative except as noted per HPI  Allergies  Allergen Reactions   Corticosteroids Other (See Comments)    Suicidal   Prochlorperazine Other (See Comments)    Acute dystonic reaction type   Cortisone    Egg Protein-Containing Drug Products Nausea And Vomiting   Nickel Rash    skin   Penicillins Rash    Past Medical History:  Diagnosis Date   Alcohol abuse    COPD (chronic obstructive pulmonary disease) (HCC)    Depression    Hypertension    OSA (obstructive sleep apnea)    Reflux     Past Surgical History:  Procedure Laterality Date   ABDOMINAL HYSTERECTOMY     APPENDECTOMY     78 yo   BLADDER SURGERY  2015   CHOLECYSTECTOMY     REPLACEMENT TOTAL KNEE     ROTATOR CUFF REPAIR     TONSILLECTOMY      Social History   Socioeconomic History   Marital status: Married    Spouse name: Not on file   Number of children: 3   Years of education: Not on file   Highest education level: Not on file  Occupational History   Not on file  Tobacco Use   Smoking status: Never   Smokeless tobacco: Never  Vaping Use   Vaping status: Never Used  Substance and Sexual Activity   Alcohol use: No    Alcohol/week: 0.0 standard drinks of alcohol   Drug use: No   Sexual activity: Not on file  Other Topics Concern   Not on file  Social History Narrative   [place] lives with her  husband. She has 3 children. She loves painting.    Social Drivers of Corporate Investment Banker Strain: Low Risk  (09/23/2023)   Overall Financial Resource Strain (CARDIA)    Difficulty of Paying Living Expenses: Not hard at all  Food Insecurity: No Food Insecurity (09/23/2023)   Hunger Vital Sign    Worried About Running Out of Food in the Last Year: Never true    Ran Out of Food in the Last Year: Never true  Transportation Needs: No Transportation Needs (09/23/2023)   PRAPARE - Administrator, Civil Service (Medical): No    Lack of Transportation (Non-Medical): No  Physical Activity: Sufficiently Active (09/23/2023)   Exercise Vital Sign    Days of Exercise per Week: 5 days    Minutes of Exercise per Session: 60 min  Stress: No Stress Concern Present (09/23/2023)   [employer] of Occupational Health - Occupational Stress Questionnaire    Feeling of Stress : Not at all  Social Connections: Socially Integrated (09/23/2023)   Social Connection and Isolation Panel    Frequency of Communication with Friends and Family: More than three times a week  Frequency of Social Gatherings with Friends and Family: More than three times a week    Attends Religious Services: More than 4 times per year    Active Member of Clubs or Organizations: Yes    Attends Engineer, Structural: More than 4 times per year    Marital Status: Married    Family History  Problem Relation Age of Onset   Alcohol abuse Mother    Atrial fibrillation Father    Breast cancer Sister 46   Breast cancer Maternal Aunt    Breast cancer Paternal Aunt     Health Maintenance  Topic Date Due   Zoster Vaccines- Shingrix (1 of 2) 10/23/1962   COVID-19 Vaccine (3 - Pfizer risk series) 11/23/2019   Colonoscopy  09/16/2022   Influenza Vaccine  10/19/2024 (Originally 02/20/2024)   Medicare Annual Wellness (AWV)  09/22/2024   DTaP/Tdap/Td (2 - Td or Tdap) 11/19/2024   Mammogram  11/03/2025   Pneumococcal  Vaccine: 50+ Years  Completed   Bone Density Scan  Completed   Meningococcal B Vaccine  Aged Out   Hepatitis C Screening  Discontinued     ----------------------------------------------------------------------------------------------------------------------------------------------------------------------------------------------------------------- Physical Exam BP 109/67 (BP Location: Left Arm, Patient Position: Sitting, Cuff Size: Large)   Pulse 91   Temp 98.2 F (36.8 C) (Oral)   Ht 5' 4 (1.626 m)   Wt 204 lb (92.5 kg)   SpO2 96%   BMI 35.02 kg/m   Physical Exam Constitutional:      Appearance: Normal appearance.  Eyes:     General: No scleral icterus. Cardiovascular:     Rate and Rhythm: Normal rate and regular rhythm.  Pulmonary:     Effort: Pulmonary effort is normal.     Breath sounds: Normal breath sounds.  Neurological:     General: No focal deficit present.     Mental Status: She is alert.  Psychiatric:        Mood and Affect: Mood normal.        Behavior: Behavior normal.     ------------------------------------------------------------------------------------------------------------------------------------------------------------------------------------------------------------------- Assessment and Plan  OSA and COPD overlap syndrome (HCC) History of OSA and intolerant to CPAP.  I think she would benefit from addition of zepbound .  Rx for 2.5mg  sent in with plan to titrate based on tolerance.   Sinobronchitis Treating with doxycycline .  Recommend continued supportive care with increased fluids and OTC meds for symptoms.  Red flags reviewed.    Meds ordered this encounter  Medications   doxycycline  (VIBRA -TABS) 100 MG tablet    Sig: Take 1 tablet (100 mg total) by mouth 2 (two) times daily.    Dispense:  20 tablet    Refill:  0   tirzepatide  (ZEPBOUND ) 2.5 MG/0.5ML Pen    Sig: Inject 2.5 mg into the skin once a week.    Dispense:  2 mL    Refill:  0    tirzepatide  (ZEPBOUND ) 5 MG/0.5ML Pen    Sig: Inject 5 mg into the skin once a week.    Dispense:  2 mL    Refill:  0    No follow-ups on file.

## 2024-06-23 NOTE — Assessment & Plan Note (Signed)
 History of OSA and intolerant to CPAP.  I think she would benefit from addition of zepbound.  Rx for 2.5mg  sent in with plan to titrate based on tolerance.

## 2024-06-23 NOTE — Telephone Encounter (Signed)
 Patient seen today 06/23/2024 with Dr. Alvia

## 2024-06-23 NOTE — Telephone Encounter (Signed)
 FYI Only or Action Required?: FYI only for provider: appointment scheduled on 06/23/2024. Patient was last seen in primary care on 02/16/2024 by Alvia Bring, DO.  Called Nurse Triage reporting Cough.  Symptoms began 1.5 weeks.  Interventions attempted: Nothing. / Symptoms are: gradually worsening.  Triage Disposition: See Physician Within 24 Hours  Patient/caregiver understands and will follow disposition?: Yes    Copied from CRM #[id number]. Topic: Clinical - Red Word Triage >> Jun 23, 2024  8:03 AM Mia F wrote: Red Word that prompted transfer to Nurse Triage: Cough and cold symptoms going on for a week or more. Coughing up colored phlegm, body aches, headaches, sore throat, chest discomfort, sore stomach from coughing. Reason for Disposition  SEVERE coughing spells (e.g., whooping sound after coughing, vomiting after coughing)  Answer Assessment - Initial Assessment Questions 1. ONSET: When did the cough begin?      1.5 weeks 2. SEVERITY: How bad is the cough today?      moderate 3. SPUTUM: Describe the color of your sputum (e.g., none, dry cough; clear, white, yellow, green)     White  4. HEMOPTYSIS: Are you coughing up any blood? If Yes, ask: How much? (e.g., flecks, streaks, tablespoons, etc.)     no 5. DIFFICULTY BREATHING: Are you having difficulty breathing? If Yes, ask: How bad is it? (e.g., mild, moderate, severe)      no 6. FEVER: Do you have a fever? If Yes, ask: What is your temperature, how was it measured, and when did it start?     no 7. CARDIAC HISTORY: Do you have any history of heart disease? (e.g., heart attack, congestive heart failure)      na 8. LUNG HISTORY: Do you have any history of lung disease?  (e.g., pulmonary embolus, asthma, emphysema)     COPD 9. PE RISK FACTORS: Do you have a history of blood clots? (or: recent major surgery, recent prolonged travel, bedridden)     no 10. OTHER SYMPTOMS: Do you have any other symptoms?  (e.g., runny nose, wheezing, chest pain)       Bilateral ear pain & drainage, body aches, headaches, sore throat 11. PREGNANCY: Is there any chance you are pregnant? When was your last menstrual period?       na 12. TRAVEL: Have you traveled out of the country in the last month? (e.g., travel history, exposures)       na  Protocols used: Cough - Acute Productive-A-AH

## 2024-06-25 ENCOUNTER — Telehealth: Payer: Self-pay | Admitting: Family Medicine

## 2024-06-25 ENCOUNTER — Telehealth: Payer: Self-pay

## 2024-06-25 DIAGNOSIS — Z8669 Personal history of other diseases of the nervous system and sense organs: Secondary | ICD-10-CM

## 2024-06-25 NOTE — Telephone Encounter (Signed)
 Copied from CRM #[id number]. Topic: Referral - Status >> Jun 25, 2024  1:00 PM Jasmin G wrote: Reason for CRM: Recent referred clinic is [facility], please fax at [phone].

## 2024-06-25 NOTE — Telephone Encounter (Addendum)
 Pharmacy Patient Advocate Encounter   Received notification from Onbase that prior authorization for Zepbound  2.5MG /0.5ML pen-injectors  is required/requested.   Insurance verification completed.   The patient is insured through [employer].   Per test claim: PA required; However, NEW/RECENT labs/notes are needed to complete & submit PA request. Please see below.   Key: [id number]

## 2024-06-25 NOTE — Telephone Encounter (Signed)
Left message advising of recommendations.  

## 2024-07-08 ENCOUNTER — Ambulatory Visit: Payer: Self-pay

## 2024-07-08 ENCOUNTER — Ambulatory Visit: Admitting: Family Medicine

## 2024-07-08 ENCOUNTER — Encounter: Payer: Self-pay | Admitting: Family Medicine

## 2024-07-08 VITALS — BP 122/64 | HR 63 | Temp 98.1°F | Resp 18 | Ht 68.0 in | Wt 200.4 lb

## 2024-07-08 DIAGNOSIS — R6889 Other general symptoms and signs: Secondary | ICD-10-CM | POA: Diagnosis not present

## 2024-07-08 DIAGNOSIS — J441 Chronic obstructive pulmonary disease with (acute) exacerbation: Secondary | ICD-10-CM

## 2024-07-08 DIAGNOSIS — G4733 Obstructive sleep apnea (adult) (pediatric): Secondary | ICD-10-CM

## 2024-07-08 DIAGNOSIS — J449 Chronic obstructive pulmonary disease, unspecified: Secondary | ICD-10-CM | POA: Diagnosis not present

## 2024-07-08 DIAGNOSIS — Z23 Encounter for immunization: Secondary | ICD-10-CM

## 2024-07-08 DIAGNOSIS — I10 Essential (primary) hypertension: Secondary | ICD-10-CM

## 2024-07-08 LAB — POC COVID19/FLU A&B COMBO
Covid Antigen, POC: NEGATIVE
Influenza A Antigen, POC: NEGATIVE
Influenza B Antigen, POC: NEGATIVE

## 2024-07-08 MED ORDER — ZEPBOUND 5 MG/0.5ML ~~LOC~~ SOAJ: 5.0000 mg | SUBCUTANEOUS | 0 refills | Status: AC

## 2024-07-08 MED ORDER — ZEPBOUND 2.5 MG/0.5ML ~~LOC~~ SOAJ: 2.5000 mg | SUBCUTANEOUS | 0 refills | Status: AC

## 2024-07-08 MED ORDER — APIXABAN 2.5 MG PO TABS: 2.5000 mg | ORAL_TABLET | Freq: Two times a day (BID) | ORAL | 1 refills | Status: AC

## 2024-07-08 MED ORDER — CEFDINIR 300 MG PO CAPS: 300.0000 mg | ORAL_CAPSULE | Freq: Two times a day (BID) | ORAL | 0 refills | Status: AC

## 2024-07-08 NOTE — Telephone Encounter (Signed)
°  FYI Only or Action Required?: FYI only for provider: appointment scheduled on 07/08/24.  Patient was last seen in primary care on 06/23/2024 by Alvia Bring, DO.  Called Nurse Triage reporting No chief complaint on file..  Symptoms began several weeks ago.  Interventions attempted: Prescription medications: doxycycline .  Symptoms are: unchanged.  Triage Disposition: See PCP When Office is Open (Within 3 Days)  Patient/caregiver understands and will follow disposition?:    Reason for Disposition  Cough has been present for > 3 weeks  Answer Assessment - Initial Assessment Questions 1. ONSET: When did the cough begin?      *No Answer* 2. SEVERITY: How bad is the cough today?      persistent 3. SPUTUM: Describe the color of your sputum (e.g., none, dry cough; clear, white, yellow, green)     unknonw 4. HEMOPTYSIS: Are you coughing up any blood? If Yes, ask: How much? (e.g., flecks, streaks, tablespoons, etc.)      5. DIFFICULTY BREATHING: Are you having difficulty breathing? If Yes, ask: How bad is it? (e.g., mild, moderate, severe)      denies 6. FEVER: Do you have a fever? If Yes, ask: What is your temperature, how was it measured, and when did it start?     Denies today, felt feverish last night 7. CARDIAC HISTORY: Do you have any history of heart disease? (e.g., heart attack, congestive heart failure)       8. LUNG HISTORY: Do you have any history of lung disease?  (e.g., pulmonary embolus, asthma, emphysema)      9. PE RISK FACTORS: Do you have a history of blood clots? (or: recent major surgery, recent prolonged travel, bedridden)     *No Answer* 10. OTHER SYMPTOMS: Do you have any other symptoms? (e.g., runny nose, wheezing, chest pain)       denies  Protocols used: Cough - Acute Productive-A-AH   Patient/caregiver understands and will follow disposition?: yes

## 2024-07-08 NOTE — Assessment & Plan Note (Signed)
 History of OSA and intolerant to CPAP.  Recent sleep study confirms continued OSA.   I think she would benefit from addition of zepbound .  Rx for 2.5mg  sent in with plan to titrate based on tolerance.

## 2024-07-08 NOTE — Assessment & Plan Note (Signed)
 BP is well controlled Continue current medications

## 2024-07-08 NOTE — Telephone Encounter (Signed)
 Patient scheduled in office today 07/08/2024 with Dr. Alvia.

## 2024-07-08 NOTE — Progress Notes (Signed)
 Kimberly Jackson - 78 y.o. female MRN [id number]  Date of birth: [birth year]  Subjective Chief Complaint  Patient presents with   Cough    HPI Kimberly Jackson is a 78 y.o. female here today with complaint of continued cough.  Had URI symptoms recently which improved then worsened again over the past few days.  Cough is semi-productive.  She has noted some occasional wheezing.  She thinks that she may have had fever yesterday.  Denies dyspnea.  Tried over-the-counter cough medication with limited relief.  Recent sleep study did return with mild to moderate OSA.  AHI 6.2.  She was prescribed CPAP in the past but did not tolerate this.  She would be interested in trying Zepbound .  ROS:  A comprehensive ROS was completed and negative except as noted per HPI  Allergies[1]  Past Medical History:  Diagnosis Date   Alcohol abuse    COPD (chronic obstructive pulmonary disease) (HCC)    Depression    Hypertension    OSA (obstructive sleep apnea)    Reflux     Past Surgical History:  Procedure Laterality Date   ABDOMINAL HYSTERECTOMY     APPENDECTOMY     78 yo   BLADDER SURGERY  2015   CHOLECYSTECTOMY     REPLACEMENT TOTAL KNEE     ROTATOR CUFF REPAIR     TONSILLECTOMY      Social History   Socioeconomic History   Marital status: Married    Spouse name: Not on file   Number of children: 3   Years of education: Not on file   Highest education level: Not on file  Occupational History   Not on file  Tobacco Use   Smoking status: Never   Smokeless tobacco: Never  Vaping Use   Vaping status: Never Used  Substance and Sexual Activity   Alcohol use: No    Alcohol/week: 0.0 standard drinks of alcohol   Drug use: No   Sexual activity: Not on file  Other Topics Concern   Not on file  Social History Narrative   Kimberly Jackson lives with her husband. She has 3 children. She loves painting.    Social Drivers of Health   Tobacco Use: Low Risk (06/23/2024)   Patient History    Smoking  Tobacco Use: Never    Smokeless Tobacco Use: Never    Passive Exposure: Not on file  Financial Resource Strain: Low Risk (09/23/2023)   Overall Financial Resource Strain (CARDIA)    Difficulty of Paying Living Expenses: Not hard at all  Food Insecurity: No Food Insecurity (09/23/2023)   Hunger Vital Sign    Worried About Running Out of Food in the Last Year: Never true    Ran Out of Food in the Last Year: Never true  Transportation Needs: No Transportation Needs (09/23/2023)   PRAPARE - Administrator, Civil Service (Medical): No    Lack of Transportation (Non-Medical): No  Physical Activity: Sufficiently Active (09/23/2023)   Exercise Vital Sign    Days of Exercise per Week: 5 days    Minutes of Exercise per Session: 60 min  Stress: No Stress Concern Present (09/23/2023)   [employer] of Occupational Health - Occupational Stress Questionnaire    Feeling of Stress : Not at all  Social Connections: Socially Integrated (09/23/2023)   Social Connection and Isolation Panel    Frequency of Communication with Friends and Family: More than three times a week    Frequency of Social Gatherings  with Friends and Family: More than three times a week    Attends Religious Services: More than 4 times per year    Active Member of Clubs or Organizations: Yes    Attends Banker Meetings: More than 4 times per year    Marital Status: Married  Depression (PHQ2-9): Low Risk (09/23/2023)   Depression (PHQ2-9)    PHQ-2 Score: 0  Alcohol Screen: Low Risk (09/23/2023)   Alcohol Screen    Last Alcohol Screening Score (AUDIT): 0  Housing: Unknown (09/23/2023)   Housing Stability Vital Sign    Unable to Pay for Housing in the Last Year: No    Number of Times Moved in the Last Year: Not on file    Homeless in the Last Year: No  Utilities: Not At Risk (09/23/2023)   AHC Utilities    Threatened with loss of utilities: No  Health Literacy: Adequate Health Literacy (09/23/2023)   B1300 Health  Literacy    Frequency of need for help with medical instructions: Never    Family History  Problem Relation Age of Onset   Alcohol abuse Mother    Atrial fibrillation Father    Breast cancer Sister 20   Breast cancer Maternal Aunt    Breast cancer Paternal Aunt     Health Maintenance  Topic Date Due   Colonoscopy  09/16/2022   COVID-19 Vaccine (3 - Pfizer risk series) 07/24/2024 (Originally 11/23/2019)   Zoster Vaccines- Shingrix (1 of 2) 10/06/2024 (Originally 10/23/1962)   Medicare Annual Wellness (AWV)  09/22/2024   DTaP/Tdap/Td (2 - Td or Tdap) 11/19/2024   Mammogram  11/03/2025   Pneumococcal Vaccine: 50+ Years  Completed   Influenza Vaccine  Completed   Bone Density Scan  Completed   Meningococcal B Vaccine  Aged Out   Hepatitis C Screening  Discontinued     ----------------------------------------------------------------------------------------------------------------------------------------------------------------------------------------------------------------- Physical Exam BP 122/64 (BP Location: Left Arm, Patient Position: Sitting, Cuff Size: Normal)   Pulse 63   Temp 98.1 F (36.7 C) (Oral)   Resp 18   Ht 5' 8 (1.727 m)   Wt 200 lb 6.4 oz (90.9 kg)   SpO2 97%   BMI 30.47 kg/m   Physical Exam Constitutional:      Appearance: Normal appearance.  HENT:     Head: Normocephalic and atraumatic.  Eyes:     General: No scleral icterus. Cardiovascular:     Rate and Rhythm: Normal rate and regular rhythm.  Pulmonary:     Effort: Pulmonary effort is normal.     Breath sounds: Wheezing (mild end expiratory wheezing.) present.  Musculoskeletal:     Cervical back: Neck supple.  Neurological:     Mental Status: She is alert.  Psychiatric:        Mood and Affect: Mood normal.        Behavior: Behavior normal.      ------------------------------------------------------------------------------------------------------------------------------------------------------------------------------------------------------------------- Assessment and Plan  OSA and COPD overlap syndrome (HCC) History of OSA and intolerant to CPAP.  Recent sleep study confirms continued OSA.   I think she would benefit from addition of zepbound .  Rx for 2.5mg  sent in with plan to titrate based on tolerance.   HTN (hypertension) BP is well controlled.  Continue current medications.   COPD exacerbation (HCC) Point-of-care influenza and COVID testing negative.  Recommend continued supportive care.  Adding course of cefdinir .  Does not tolerate steroids well due to history of suicidal ideation.  Red flags reviewed.   Meds ordered this encounter  Medications  apixaban  (ELIQUIS ) 2.5 MG TABS tablet    Sig: Take 1 tablet (2.5 mg total) by mouth 2 (two) times daily.    Dispense:  180 tablet    Refill:  1   tirzepatide  (ZEPBOUND ) 2.5 MG/0.5ML Pen    Sig: Inject 2.5 mg into the skin once a week.    Dispense:  2 mL    Refill:  0   tirzepatide  (ZEPBOUND ) 5 MG/0.5ML Pen    Sig: Inject 5 mg into the skin once a week.    Dispense:  2 mL    Refill:  0   cefdinir  (OMNICEF ) 300 MG capsule    Sig: Take 1 capsule (300 mg total) by mouth 2 (two) times daily.    Dispense:  20 capsule    Refill:  0    No follow-ups on file.        [1]  Allergies Allergen Reactions   Corticosteroids Other (See Comments)    Suicidal   Prochlorperazine Other (See Comments)    Acute dystonic reaction type   Cortisone    Egg Protein-Containing Drug Products Nausea And Vomiting   Nickel Rash    skin   Penicillins Rash

## 2024-07-08 NOTE — Assessment & Plan Note (Signed)
 Point-of-care influenza and COVID testing negative.  Recommend continued supportive care.  Adding course of cefdinir .  Does not tolerate steroids well due to history of suicidal ideation.  Red flags reviewed.

## 2024-07-09 ENCOUNTER — Other Ambulatory Visit (HOSPITAL_COMMUNITY): Payer: Self-pay

## 2024-07-12 ENCOUNTER — Ambulatory Visit: Payer: Self-pay

## 2024-07-12 NOTE — Telephone Encounter (Signed)
 FYI Only or Action Required?: Action required by provider: clinical question for provider and update on patient condition.  Patient was last seen in primary care on 07/08/2024 by Alvia Bring, DO.  Called Nurse Triage reporting Nausea.  Symptoms began a week ago.  Interventions attempted: Prescription medications: Omnicef  and Rest, hydration, or home remedies.  Symptoms are: unchanged.  Triage Disposition: Call PCP Within 24 Hours  Patient/caregiver understands and will follow disposition?: Yes     Message from [facility] A sent at 07/12/2024  8:24 AM EST  Summary: Allergic reaction to medication   Reason for Triage: Patient states that she has been to the office twice in the last two weeks. She had a sinus infection and a touch of bronchitis. Patient states that she was given an antibiotic. She had to come back last week and was given another prescription. Patient states that she is having an allergic reaction to the medication. Symptoms: nausea, stomach is tender where she cannot stand to have elastic touch it. She took a pill yesterday morning and has not taken none ever since. She is wanting to know if she need to keep taking the medication.         Taken since Thursday 12/18; Omnicef  300mg  BID, last pill 12/21 d/t nausea  Reason for Disposition  Taking prescription medication that could cause nausea (e.g., narcotics/opiates, antibiotics, OCPs, many others)  Answer Assessment - Initial Assessment Questions Pt called in to report increased nausea and abdominal cramping since taking Omnicef  300mg  BID 12/18. Pt states after taking medication she is nauseous and has stomach pain; pt reports she takes medication with cheese crackers d/t lack of appetite. Pt stopped taking rx 12/21 am d/t nausea. Discussed abx can cause GI upset and recommended pt take rx with heavier meals at breakfast and dinner to avoid nausea. Pt denies vomiting; reports loose stool x1 when she started medication  12/18 but no persistent diarrhea. Pt reports she is feeling better from a cold standpoint but wasn't sure what to do about abx. Discussed increase oral intake and to take rx with full glass of water. Reassured her that I would send info to PCP. She voiced appreciation.     1. NAUSEA SEVERITY: How bad is the nausea? (e.g., mild, moderate, severe; dehydration, weight loss)     Severe after taking Omnicef ; reports still taking in fluids   2. ONSET: When did the nausea begin?     With medication; 12/18  3. VOMITING: Any vomiting? If Yes, ask: How many times today?     Gagged; dry heaving   4. RECURRENT SYMPTOM: Have you had nausea before? If Yes, ask: When was the last time? What happened that time?     Yes; thinks this is more related to medication   5. CAUSE: What do you think is causing the nausea?     Abx sensitivity  Protocols used: Nausea-A-AH

## 2024-07-12 NOTE — Telephone Encounter (Signed)
 Attempt # 1 to reach patient to triage symptoms. Left VM to call back    Message from [facility] A sent at 07/12/2024  8:24 AM EST  Summary: Allergic reaction to medication   Reason for Triage: Patient states that she has been to the office twice in the last two weeks. She had a sinus infection and a touch of bronchitis. Patient states that she was given an antibiotic. She had to come back last week and was given another prescription. Patient states that she is having an allergic reaction to the medication. Symptoms: nausea, stomach is tender where she cannot stand to have elastic touch it. She took a pill yesterday morning and has not taken none ever since. She is wanting to know if she need to keep taking the medication.

## 2024-07-13 ENCOUNTER — Telehealth: Payer: Self-pay

## 2024-07-13 NOTE — Telephone Encounter (Signed)
 Copied from CRM #[id number]. Topic: Clinical - Pink Word Triage >> Jul 12, 2024  8:24 AM [name] wrote: Pink Word triggered transfer to Nurse Triage. See Triage Message for details.

## 2024-07-13 NOTE — Telephone Encounter (Signed)
 Patient informed.

## 2024-07-13 NOTE — Telephone Encounter (Signed)
 Attempted call to patient. Left a voice mail message requesting a return call.

## 2024-07-16 NOTE — Telephone Encounter (Signed)
 See MyChart message

## 2024-07-26 DIAGNOSIS — J449 Chronic obstructive pulmonary disease, unspecified: Secondary | ICD-10-CM

## 2024-07-26 MED ORDER — FLUTICASONE-SALMETEROL 230-21 MCG/ACT IN AERO: 2.0000 | INHALATION_SPRAY | Freq: Two times a day (BID) | RESPIRATORY_TRACT | 1 refills | Status: DC

## 2024-07-26 NOTE — Telephone Encounter (Signed)
 FYI Only or Action Required?: Action required by provider: medication refill request, clinical question for provider, and update on patient condition.  Patient was last seen in primary care on 07/08/2024 by Alvia Bring, DO.  Called Nurse Triage reporting Breathing Problem.  Symptoms began today.  Interventions attempted: Prescription medications: advair .  Symptoms are: stable.  Triage Disposition: See PCP When Office is Open (Within 3 Days)  Patient/caregiver understands and will follow disposition?: Yes   Copied from CRM [id number]. Topic: Clinical - Medication Question >> Jul 26, 2024  9:03 AM [name] wrote: Reason for CRM: Patient is requesting a refill on her Advair  Inhaler. Patient states that she had an asthma attack last night and had to use it. It is the inhaler from 2023.  [id number] [place], [place] - [address] AT [facility] OF [address]  Phone: [phone] Fax: [phone]   Reason for Disposition  [1] MODERATE longstanding difficulty breathing (e.g., speaks in phrases, SOB even at rest, pulse 100-120) AND [2] SAME as normal  Answer Assessment - Initial Assessment Questions Returned call to f/u on symptoms. Pt states that difficulty breathing and wheezing occurred last night and resolved after using 3 puffs of advair . Pt states she was scared to go back to sleep as it came out of nowhere. Denied any current distress; no sob, wheezing, chest pain. Pt reports getting over the flu after being exposed last week. Discussed refill of advair  and pt stated she called [facility] and was told she did not have any scripts. Pt confirmed inhaler was from 2023. Pt has appt scheduled 08/18/24 and encouraged her to f/u with clinic if another event like this occurs. Pt voiced appreciation.     1. RESPIRATORY STATUS: Describe your breathing? (e.g., wheezing, shortness of breath, unable to speak, severe coughing)      At this time no difficulty breathing or  wheezing; pt reports difficulty breathing and wheezing occurred last night   2. ONSET: When did this breathing problem begin?      X1 last night, 01/04  3. PATTERN Does the difficult breathing come and go, or has it been constant since it started?      One time occurrence   4. SEVERITY: How bad is your breathing? (e.g., mild, moderate, severe)      Currently no distress   5. RECURRENT SYMPTOM: Have you had difficulty breathing before? If Yes, ask: When was the last time? and What happened that time?      Pt reports having COPD and emphysema   8. CAUSE: What do you think is causing the breathing problem?      Unsure  9. OTHER SYMPTOMS: Do you have any other symptoms? (e.g., chest pain, cough, dizziness, fever, runny nose)     Cough; pt reports recovering from the flu  Protocols used: Breathing Difficulty-A-AH

## 2024-07-26 NOTE — Telephone Encounter (Signed)
 1st attempt. Left message with office call back number.  Copied from CRM [id number]. Topic: Clinical - Medication Question >> Jul 26, 2024  9:03 AM Kimberly Jackson wrote: Reason for CRM: Patient is requesting Jackson refill on her Advair  Inhaler. Patient states that she had an asthma attack last night and had to use it. It is the inhaler from 2023.  [id number] [place], [place] - [address] AT [facility] [address]  Phone: [phone] Fax: [phone]

## 2024-07-26 NOTE — Telephone Encounter (Signed)
 Left message for a return call. Does she need to be seen? Does she have shortness of breath, wheezing or cough?

## 2024-07-26 NOTE — Addendum Note (Signed)
 Addended by: BONNY JON DEL on: 07/26/2024 03:52 PM   Modules accepted: Orders

## 2024-07-28 ENCOUNTER — Telehealth: Payer: Self-pay

## 2024-07-28 ENCOUNTER — Ambulatory Visit: Payer: Self-pay

## 2024-07-28 DIAGNOSIS — J449 Chronic obstructive pulmonary disease, unspecified: Secondary | ICD-10-CM

## 2024-07-28 MED ORDER — FLUTICASONE-SALMETEROL 230-21 MCG/ACT IN AERO: 2.0000 | INHALATION_SPRAY | Freq: Two times a day (BID) | RESPIRATORY_TRACT | 1 refills | Status: DC

## 2024-07-28 NOTE — Telephone Encounter (Signed)
 Copied from CRM [id number]. Topic: Clinical - Prescription Issue >> Jul 28, 2024 12:52 PM Winona R wrote: fluticasone -salmeterol (ADVAIR  HFA) 230-21 MCG/ACT inhaler [[id number]] Pt [facility]

## 2024-07-28 NOTE — Telephone Encounter (Signed)
 Copied from CRM #[id number]. Topic: Clinical - Red Word Triage >> Jul 28, 2024 12:53 PM Winona Jackson wrote: Kimberly stomach pain anytime she drinks liquids. Last moment of pain made her bend over. Reason for Disposition  [1] MODERATE pain (e.g., interferes with normal activities) AND [2] pain comes and goes (cramps) AND [3] present > 24 hours  (Exception: Pain with Vomiting or Diarrhea - see that Guideline.)  Answer Assessment - Initial Assessment Questions Patient states that the other night she had some SOB and used her inhalers with relief. She states that after this she's been experiencing an upset stomach with 8/10 cramping pain that subsides after a few minutes after she drinks fluids rapidly. She also reports nausea, tenderness, and bloating. Office visit scheduled for 07/30/24, no sooner appts available with her PCP and prefers to see him. Advised to call back if symptoms worsen.   1. LOCATION: Where does it hurt?      Abdomen  2. RADIATION: Does the pain shoot anywhere else? (e.g., chest, back)     No  3. ONSET: When did the pain begin? (e.g., minutes, hours or days ago)      Yesterday  4. SUDDEN: Gradual or sudden onset?     Sudden after drinking fluids  5. PATTERN Does the pain come and go, or is it constant?     Comes and goes  6. SEVERITY: How bad is the pain?  (e.g., Scale 1-10; mild, moderate, or severe)     8/10  7. RECURRENT SYMPTOM: Have you ever had this type of stomach pain before? If Yes, ask: When was the last time? and What happened that time?      No  8. CAUSE: What do you think is causing the stomach pain? (e.g., gallstones, recent abdominal surgery)     Not sure  9. RELIEVING/AGGRAVATING FACTORS: What makes it better or worse? (e.g., antacids, bending or twisting motion, bowel movement)     Aggravated by rapid fluid intake, seems to tolerate small sips  10. OTHER SYMPTOMS: Do you have any other symptoms? (e.g., back pain, diarrhea, fever,  urination pain, vomiting)       Nausea, tenderness, bloating  11. PREGNANCY: Is there any chance you are pregnant? When was your last menstrual period?       NA  Protocols used: Abdominal Pain - Female-A-AH

## 2024-07-30 ENCOUNTER — Ambulatory Visit: Admitting: Family Medicine

## 2024-07-30 ENCOUNTER — Other Ambulatory Visit: Payer: Self-pay | Admitting: Family Medicine

## 2024-07-30 ENCOUNTER — Ambulatory Visit

## 2024-07-30 ENCOUNTER — Encounter: Payer: Self-pay | Admitting: Family Medicine

## 2024-07-30 VITALS — BP 159/73 | HR 57 | Ht 68.0 in | Wt 199.0 lb

## 2024-07-30 DIAGNOSIS — R1084 Generalized abdominal pain: Secondary | ICD-10-CM

## 2024-07-30 DIAGNOSIS — R109 Unspecified abdominal pain: Secondary | ICD-10-CM

## 2024-07-30 DIAGNOSIS — Z8719 Personal history of other diseases of the digestive system: Secondary | ICD-10-CM

## 2024-07-30 DIAGNOSIS — R0789 Other chest pain: Secondary | ICD-10-CM

## 2024-07-30 NOTE — Progress Notes (Unsigned)
 .  Kimberly Jackson - 79 y.o. female MRN [id number]  Date of birth: 1946/06/07  Subjective Chief Complaint  Patient presents with   Abdominal Pain    HPI Kimberly Jackson is a 79 y.o. female here today with complaint of abdominal pain..  She has epigastric pain that radiates up into her chest after eating.  Also with lower abdominal pain.  Symptoms started a few days ago.  She does report history of hiatal hernia as well as Zenker's diverticulum.  She had surgery for sinkers in the past.  Her appetite has been pretty good.  She denies nausea or vomiting.  She does report that her bowels are running normally.  No fever or chills.  ROS:  A comprehensive ROS was completed and negative except as noted per HPI  Allergies[1]  Past Medical History:  Diagnosis Date   Alcohol abuse    COPD (chronic obstructive pulmonary disease) (HCC)    Depression    Hypertension    OSA (obstructive sleep apnea)    Reflux     Past Surgical History:  Procedure Laterality Date   ABDOMINAL HYSTERECTOMY     APPENDECTOMY     79 yo   BLADDER SURGERY  2015   CHOLECYSTECTOMY     REPLACEMENT TOTAL KNEE     ROTATOR CUFF REPAIR     TONSILLECTOMY      Social History   Socioeconomic History   Marital status: Married    Spouse name: Not on file   Number of children: 3   Years of education: Not on file   Highest education level: Not on file  Occupational History   Not on file  Tobacco Use   Smoking status: Never   Smokeless tobacco: Never  Vaping Use   Vaping status: Never Used  Substance and Sexual Activity   Alcohol use: No    Alcohol/week: 0.0 standard drinks of alcohol   Drug use: No   Sexual activity: Not on file  Other Topics Concern   Not on file  Social History Narrative   [place] lives with her husband. She has 3 children. She loves painting.    Social Drivers of Health   Tobacco Use: Low Risk (07/30/2024)   Patient History    Smoking Tobacco Use: Never    Smokeless Tobacco Use: Never     Passive Exposure: Not on file  Financial Resource Strain: Low Risk (09/23/2023)   Overall Financial Resource Strain (CARDIA)    Difficulty of Paying Living Expenses: Not hard at all  Food Insecurity: No Food Insecurity (09/23/2023)   Hunger Vital Sign    Worried About Running Out of Food in the Last Year: Never true    Ran Out of Food in the Last Year: Never true  Transportation Needs: No Transportation Needs (09/23/2023)   PRAPARE - Administrator, Civil Service (Medical): No    Lack of Transportation (Non-Medical): No  Physical Activity: Sufficiently Active (09/23/2023)   Exercise Vital Sign    Days of Exercise per Week: 5 days    Minutes of Exercise per Session: 60 min  Stress: No Stress Concern Present (09/23/2023)   [employer] of Occupational Health - Occupational Stress Questionnaire    Feeling of Stress : Not at all  Social Connections: Socially Integrated (09/23/2023)   Social Connection and Isolation Panel    Frequency of Communication with Friends and Family: More than three times a week    Frequency of Social Gatherings with Friends  and Family: More than three times a week    Attends Religious Services: More than 4 times per year    Active Member of Clubs or Organizations: Yes    Attends Banker Meetings: More than 4 times per year    Marital Status: Married  Depression (PHQ2-9): Low Risk (09/23/2023)   Depression (PHQ2-9)    PHQ-2 Score: 0  Alcohol Screen: Low Risk (09/23/2023)   Alcohol Screen    Last Alcohol Screening Score (AUDIT): 0  Housing: Unknown (09/23/2023)   Housing Stability Vital Sign    Unable to Pay for Housing in the Last Year: No    Number of Times Moved in the Last Year: Not on file    Homeless in the Last Year: No  Utilities: Not At Risk (09/23/2023)   AHC Utilities    Threatened with loss of utilities: No  Health Literacy: Adequate Health Literacy (09/23/2023)   B1300 Health Literacy    Frequency of need for help with medical  instructions: Never    Family History  Problem Relation Age of Onset   Alcohol abuse Mother    Atrial fibrillation Father    Breast cancer Sister 78   Breast cancer Maternal Aunt    Breast cancer Paternal Aunt     Health Maintenance  Topic Date Due   COVID-19 Vaccine (3 - Pfizer risk series) 11/23/2019   Colonoscopy  09/16/2022   Medicare Annual Wellness (AWV)  09/22/2024   Zoster Vaccines- Shingrix (1 of 2) 10/06/2024 (Originally 10/23/1964)   DTaP/Tdap/Td (2 - Td or Tdap) 11/19/2024   Mammogram  11/03/2025   Pneumococcal Vaccine: 50+ Years  Completed   Influenza Vaccine  Completed   Bone Density Scan  Completed   Meningococcal B Vaccine  Aged Out   Hepatitis C Screening  Discontinued     ----------------------------------------------------------------------------------------------------------------------------------------------------------------------------------------------------------------- Physical Exam BP (!) 159/73   Pulse (!) 57   Ht 5' 8 (1.727 m)   Wt 199 lb (90.3 kg)   SpO2 99%   BMI 30.26 kg/m   Physical Exam Constitutional:      Appearance: She is well-developed.  HENT:     Head: Normocephalic and atraumatic.  Eyes:     General: No scleral icterus. Cardiovascular:     Rate and Rhythm: Normal rate and regular rhythm.  Pulmonary:     Effort: Pulmonary effort is normal.     Breath sounds: Normal breath sounds.  Abdominal:     General: Abdomen is scaphoid. There is no distension.     Tenderness: There is abdominal tenderness (Mild tenderness throughout.).  Neurological:     General: No focal deficit present.     Mental Status: She is alert and oriented to person, place, and time.  Psychiatric:        Mood and Affect: Mood normal.        Behavior: Behavior normal.      ------------------------------------------------------------------------------------------------------------------------------------------------------------------------------------------------------------------- Assessment and Plan  Abdominal pain, generalized She is having epigastric as well as lower abdominal pain.  She does have some intermittent pain in the chest.  Chest x-ray and KUB ordered.  CMP and CBC ordered.   No orders of the defined types were placed in this encounter.   No follow-ups on file.         [1]  Allergies Allergen Reactions   Corticosteroids Other (See Comments)    Suicidal   Prochlorperazine Other (See Comments)    Acute dystonic reaction type   Cortisone    Egg Protein-Containing  Drug Products Nausea And Vomiting   Nickel Rash    skin   Penicillins Rash

## 2024-07-31 LAB — CMP14+EGFR
ALT: 21 IU/L (ref 0–32)
AST: 21 IU/L (ref 0–40)
Albumin: 4.4 g/dL (ref 3.8–4.8)
Alkaline Phosphatase: 143 IU/L — ABNORMAL HIGH (ref 49–135)
BUN/Creatinine Ratio: 22 (ref 12–28)
BUN: 35 mg/dL — ABNORMAL HIGH (ref 8–27)
Bilirubin Total: 0.5 mg/dL (ref 0.0–1.2)
CO2: 22 mmol/L (ref 20–29)
Calcium: 9.4 mg/dL (ref 8.7–10.3)
Chloride: 102 mmol/L (ref 96–106)
Creatinine, Ser: 1.58 mg/dL — ABNORMAL HIGH (ref 0.57–1.00)
Globulin, Total: 2.6 g/dL (ref 1.5–4.5)
Glucose: 92 mg/dL (ref 70–99)
Potassium: 5 mmol/L (ref 3.5–5.2)
Sodium: 139 mmol/L (ref 134–144)
Total Protein: 7 g/dL (ref 6.0–8.5)
eGFR: 33 mL/min/1.73 — ABNORMAL LOW

## 2024-07-31 LAB — CBC WITH DIFFERENTIAL/PLATELET
Basophils Absolute: 0 x10E3/uL (ref 0.0–0.2)
Basos: 1 %
EOS (ABSOLUTE): 0.2 x10E3/uL (ref 0.0–0.4)
Eos: 4 %
Hematocrit: 35 % (ref 34.0–46.6)
Hemoglobin: 11.8 g/dL (ref 11.1–15.9)
Immature Grans (Abs): 0.1 x10E3/uL (ref 0.0–0.1)
Immature Granulocytes: 1 %
Lymphocytes Absolute: 1.4 x10E3/uL (ref 0.7–3.1)
Lymphs: 27 %
MCH: 31.7 pg (ref 26.6–33.0)
MCHC: 33.7 g/dL (ref 31.5–35.7)
MCV: 94 fL (ref 79–97)
Monocytes Absolute: 0.4 x10E3/uL (ref 0.1–0.9)
Monocytes: 7 %
Neutrophils Absolute: 3.1 x10E3/uL (ref 1.4–7.0)
Neutrophils: 60 %
Platelets: 284 x10E3/uL (ref 150–450)
RBC: 3.72 x10E6/uL — ABNORMAL LOW (ref 3.77–5.28)
RDW: 12.4 % (ref 11.7–15.4)
WBC: 5.2 x10E3/uL (ref 3.4–10.8)

## 2024-08-01 DIAGNOSIS — R1084 Generalized abdominal pain: Secondary | ICD-10-CM | POA: Insufficient documentation

## 2024-08-01 DIAGNOSIS — R0789 Other chest pain: Secondary | ICD-10-CM | POA: Insufficient documentation

## 2024-08-01 NOTE — Assessment & Plan Note (Signed)
 She is having epigastric as well as lower abdominal pain.  She does have some intermittent pain in the chest.  Chest x-ray and KUB ordered.  CMP and CBC ordered.

## 2024-08-02 ENCOUNTER — Telehealth: Payer: Self-pay | Admitting: Pharmacy Technician

## 2024-08-02 ENCOUNTER — Other Ambulatory Visit (HOSPITAL_COMMUNITY): Payer: Self-pay

## 2024-08-02 NOTE — Telephone Encounter (Signed)
 Pharmacy Patient Advocate Encounter   Received notification from Onbase CMM KEY that prior authorization for Fluticasone -Salmeterol 230-21MCG/ACT aerosol  is required/requested.   Insurance verification completed.   The patient is insured through [employer].   Per test claim:  fluticasone /salmeterol diskus (Advair  Diskus) and budesonide/formoterol (Symbicort) is preferred by the insurance.  If suggested medication is appropriate, Please send in a new RX and discontinue this one. If not, please advise as to why it's not appropriate so that we may request a Prior Authorization. Please note, some preferred medications may still require a PA.  If the suggested medications have not been trialed and there are no contraindications to their use, the PA will not be submitted, as it will not be approved.  CMM Key# [id number]

## 2024-08-03 ENCOUNTER — Ambulatory Visit: Payer: Self-pay | Admitting: Family Medicine

## 2024-08-03 ENCOUNTER — Telehealth: Payer: Self-pay

## 2024-08-03 DIAGNOSIS — R140 Abdominal distension (gaseous): Secondary | ICD-10-CM

## 2024-08-03 DIAGNOSIS — R1084 Generalized abdominal pain: Secondary | ICD-10-CM

## 2024-08-03 NOTE — Telephone Encounter (Unsigned)
 Copied from CRM #[id number]. Topic: Clinical - Lab/Test Results >> Aug 03, 2024 10:26 AM Tonda B wrote: Reason for CRM: pt is calling to go over her test results call pt back [phone]

## 2024-08-05 NOTE — Telephone Encounter (Signed)
 Copied from CRM #[id number]. Topic: Clinical - Lab/Test Results >> Aug 05, 2024  8:33 AM Gustabo D wrote: Kimberly Jackson pt lab results- Pt says she is feeling pretty well. And would like a call when her other results are back

## 2024-08-08 MED ORDER — FLUTICASONE-SALMETEROL 100-50 MCG/ACT IN AEPB: 1.0000 | INHALATION_SPRAY | Freq: Two times a day (BID) | RESPIRATORY_TRACT | 3 refills | Status: AC

## 2024-08-18 ENCOUNTER — Ambulatory Visit: Admitting: Family Medicine

## 2024-08-20 ENCOUNTER — Other Ambulatory Visit: Payer: Self-pay | Admitting: Family Medicine

## 2024-08-24 ENCOUNTER — Ambulatory Visit: Admitting: Family Medicine

## 2024-08-25 ENCOUNTER — Ambulatory Visit: Admitting: Family Medicine

## 2024-08-25 ENCOUNTER — Encounter: Payer: Self-pay | Admitting: Family Medicine

## 2024-08-25 VITALS — BP 131/58 | HR 58 | Ht 62.21 in | Wt 203.0 lb

## 2024-08-25 DIAGNOSIS — R1084 Generalized abdominal pain: Secondary | ICD-10-CM

## 2024-08-25 DIAGNOSIS — I10 Essential (primary) hypertension: Secondary | ICD-10-CM

## 2024-08-25 DIAGNOSIS — J449 Chronic obstructive pulmonary disease, unspecified: Secondary | ICD-10-CM

## 2024-08-25 DIAGNOSIS — I480 Paroxysmal atrial fibrillation: Secondary | ICD-10-CM

## 2024-08-25 NOTE — Patient Instructions (Addendum)
 [phone] - GI referral.   Let me know if hip and knee pain are worsening.

## 2024-08-25 NOTE — Assessment & Plan Note (Signed)
 Abdominal pain has improved some.  Continue omeprazole .:  I did contact [facility] to help facilitate appointment for GI visit since she had not heard from them.  I was told that we should have the patient contact the clinic rather than us  trying to facilitate this.

## 2024-08-25 NOTE — Assessment & Plan Note (Signed)
 Stable at this time.  Remains rate controlled and anticoagulated.  Follows with cardiology.

## 2024-08-25 NOTE — Assessment & Plan Note (Signed)
 Doing well at this time. Continue current inhalers.

## 2024-08-25 NOTE — Progress Notes (Signed)
 " Kimberly Jackson - 79 y.o. female MRN [id number]  Date of birth: 1945/10/24  Subjective Chief Complaint  Patient presents with   Hypertension    HPI Kimberly Jackson is a 79 y.o. female here today for follow-up visit.  She continues on Maxide and losartan  for management of hypertension.  Also on metoprolol  as well as she has history of paroxysmal atrial fibrillation.  Blood pressure is well-controlled at this time.  Denies chest pain, shortness of breath, palpitations, headaches or vision changes.  She remains on Eliquis  for anticoagulation.    Abdominal pain that she was experiencing has improved.  Hiatal hernia noted on previous x-ray.  GI referral was placed however she has not heard from this referral.    History of OSA.  Has not been tolerant of CPAP.  We have been working to get tirzepatide  approved for her without success so far.    ROS:  A comprehensive ROS was completed and negative except as noted per HPI   Past Medical History:  Diagnosis Date   Alcohol abuse    COPD (chronic obstructive pulmonary disease) (HCC)    Depression    Hypertension    OSA (obstructive sleep apnea)    Reflux     Past Surgical History:  Procedure Laterality Date   ABDOMINAL HYSTERECTOMY     APPENDECTOMY     79 yo   BLADDER SURGERY  2015   CHOLECYSTECTOMY     REPLACEMENT TOTAL KNEE     ROTATOR CUFF REPAIR     TONSILLECTOMY      Social History   Socioeconomic History   Marital status: Married    Spouse name: Not on file   Number of children: 3   Years of education: Not on file   Highest education level: Not on file  Occupational History   Not on file  Tobacco Use   Smoking status: Never   Smokeless tobacco: Never  Vaping Use   Vaping status: Never Used  Substance and Sexual Activity   Alcohol use: No    Alcohol/week: 0.0 standard drinks of alcohol   Drug use: No   Sexual activity: Not on file  Other Topics Concern   Not on file  Social History Narrative   Kimberly Jackson lives with  her husband. She has 3 children. She loves painting.    Social Drivers of Health   Tobacco Use: Low Risk (08/25/2024)   Patient History    Smoking Tobacco Use: Never    Smokeless Tobacco Use: Never    Passive Exposure: Not on file  Financial Resource Strain: Low Risk (09/23/2023)   Overall Financial Resource Strain (CARDIA)    Difficulty of Paying Living Expenses: Not hard at all  Food Insecurity: No Food Insecurity (09/23/2023)   Hunger Vital Sign    Worried About Running Out of Food in the Last Year: Never true    Ran Out of Food in the Last Year: Never true  Transportation Needs: No Transportation Needs (09/23/2023)   PRAPARE - Administrator, Civil Service (Medical): No    Lack of Transportation (Non-Medical): No  Physical Activity: Sufficiently Active (09/23/2023)   Exercise Vital Sign    Days of Exercise per Week: 5 days    Minutes of Exercise per Session: 60 min  Stress: No Stress Concern Present (09/23/2023)   [employer] of Occupational Health - Occupational Stress Questionnaire    Feeling of Stress : Not at all  Social Connections: Socially Integrated (09/23/2023)  Social Connection and Isolation Panel    Frequency of Communication with Friends and Family: More than three times a week    Frequency of Social Gatherings with Friends and Family: More than three times a week    Attends Religious Services: More than 4 times per year    Active Member of Clubs or Organizations: Yes    Attends Banker Meetings: More than 4 times per year    Marital Status: Married  Depression (PHQ2-9): Low Risk (09/23/2023)   Depression (PHQ2-9)    PHQ-2 Score: 0  Alcohol Screen: Low Risk (09/23/2023)   Alcohol Screen    Last Alcohol Screening Score (AUDIT): 0  Housing: Unknown (09/23/2023)   Housing Stability Vital Sign    Unable to Pay for Housing in the Last Year: No    Number of Times Moved in the Last Year: Not on file    Homeless in the Last Year: No  Utilities: Not At  Risk (09/23/2023)   AHC Utilities    Threatened with loss of utilities: No  Health Literacy: Adequate Health Literacy (09/23/2023)   B1300 Health Literacy    Frequency of need for help with medical instructions: Never    Family History  Problem Relation Age of Onset   Alcohol abuse Mother    Atrial fibrillation Father    Breast cancer Sister 15   Breast cancer Maternal Aunt    Breast cancer Paternal Aunt     Health Maintenance  Topic Date Due   Colonoscopy  09/16/2022   Medicare Annual Wellness (AWV)  09/22/2024   COVID-19 Vaccine (3 - Pfizer risk series) 09/10/2024 (Originally 11/23/2019)   Zoster Vaccines- Shingrix (1 of 2) 10/06/2024 (Originally 10/23/1962)   DTaP/Tdap/Td (2 - Td or Tdap) 11/19/2024   Mammogram  11/03/2025   Pneumococcal Vaccine: 50+ Years  Completed   Influenza Vaccine  Completed   Bone Density Scan  Completed   Meningococcal B Vaccine  Aged Out   Hepatitis C Screening  Discontinued     ----------------------------------------------------------------------------------------------------------------------------------------------------------------------------------------------------------------- Physical Exam BP (!) 131/58 (BP Location: Left Arm, Patient Position: Sitting, Cuff Size: Normal)   Pulse (!) 58   Ht 5' 2.21 (1.58 m)   Wt 203 lb (92.1 kg)   SpO2 99%   BMI 36.88 kg/m   Physical Exam Constitutional:      Appearance: Normal appearance.  Eyes:     General: No scleral icterus. Cardiovascular:     Rate and Rhythm: Normal rate and regular rhythm.  Pulmonary:     Effort: Pulmonary effort is normal.     Breath sounds: Normal breath sounds.  Musculoskeletal:     Cervical back: Neck supple.  Neurological:     Mental Status: She is alert.  Psychiatric:        Mood and Affect: Mood normal.        Behavior: Behavior normal.      ------------------------------------------------------------------------------------------------------------------------------------------------------------------------------------------------------------------- Assessment and Plan  COPD (chronic obstructive pulmonary disease) (HCC) Doing well at this time. Continue current inhalers.   Paroxysmal a-fib (HCC) Stable at this time.  Remains rate controlled and anticoagulated.  Follows with cardiology.   HTN (hypertension) BP is well controlled.  Continue current medications.  Low-sodium diet encouraged.  Abdominal pain, generalized Abdominal pain has improved some.  Continue omeprazole .:  I did contact [facility] to help facilitate appointment for GI visit since she had not heard from them.  I was told that we should have the patient contact the clinic rather than us  trying  to facilitate this.   No orders of the defined types were placed in this encounter.   Return in about 6 months (around 02/22/2025) for Hypertension.      "

## 2024-08-25 NOTE — Assessment & Plan Note (Signed)
 BP is well controlled.  Continue current medications.  Low-sodium diet encouraged.

## 2024-09-28 ENCOUNTER — Ambulatory Visit

## 2025-02-24 ENCOUNTER — Ambulatory Visit: Admitting: Family Medicine
